# Patient Record
Sex: Female | Born: 1975
Health system: Southern US, Community
[De-identification: ages and names within clinical notes are randomized; demographics above are authoritative.]

## PROBLEM LIST (undated history)

## (undated) ENCOUNTER — Inpatient Hospital Stay (HOSPITAL_COMMUNITY): Payer: Self-pay

## (undated) DIAGNOSIS — J45909 Unspecified asthma, uncomplicated: Secondary | ICD-10-CM

## (undated) DIAGNOSIS — G4733 Obstructive sleep apnea (adult) (pediatric): Secondary | ICD-10-CM

## (undated) DIAGNOSIS — E538 Deficiency of other specified B group vitamins: Secondary | ICD-10-CM

## (undated) DIAGNOSIS — M1990 Unspecified osteoarthritis, unspecified site: Secondary | ICD-10-CM

## (undated) DIAGNOSIS — F32A Depression, unspecified: Secondary | ICD-10-CM

## (undated) DIAGNOSIS — E559 Vitamin D deficiency, unspecified: Secondary | ICD-10-CM

## (undated) DIAGNOSIS — T7840XA Allergy, unspecified, initial encounter: Secondary | ICD-10-CM

## (undated) DIAGNOSIS — N189 Chronic kidney disease, unspecified: Secondary | ICD-10-CM

## (undated) DIAGNOSIS — D649 Anemia, unspecified: Secondary | ICD-10-CM

## (undated) DIAGNOSIS — R12 Heartburn: Secondary | ICD-10-CM

## (undated) DIAGNOSIS — R7303 Prediabetes: Secondary | ICD-10-CM

## (undated) DIAGNOSIS — F419 Anxiety disorder, unspecified: Secondary | ICD-10-CM

## (undated) DIAGNOSIS — M7989 Other specified soft tissue disorders: Secondary | ICD-10-CM

## (undated) DIAGNOSIS — R51 Headache: Secondary | ICD-10-CM

## (undated) DIAGNOSIS — K219 Gastro-esophageal reflux disease without esophagitis: Secondary | ICD-10-CM

## (undated) DIAGNOSIS — IMO0002 Reserved for concepts with insufficient information to code with codable children: Secondary | ICD-10-CM

## (undated) HISTORY — DX: Gastro-esophageal reflux disease without esophagitis: K21.9

## (undated) HISTORY — PX: BUNIONECTOMY: SHX129

## (undated) HISTORY — DX: Anxiety disorder, unspecified: F41.9

## (undated) HISTORY — DX: Reserved for concepts with insufficient information to code with codable children: IMO0002

## (undated) HISTORY — DX: Anemia, unspecified: D64.9

## (undated) HISTORY — DX: Chronic kidney disease, unspecified: N18.9

## (undated) HISTORY — DX: Unspecified osteoarthritis, unspecified site: M19.90

## (undated) HISTORY — DX: Headache: R51

## (undated) HISTORY — PX: FOOT SURGERY: SHX648

## (undated) HISTORY — DX: Obstructive sleep apnea (adult) (pediatric): G47.33

## (undated) HISTORY — DX: Vitamin D deficiency, unspecified: E55.9

## (undated) HISTORY — DX: Deficiency of other specified B group vitamins: E53.8

## (undated) HISTORY — DX: Heartburn: R12

## (undated) HISTORY — DX: Other specified soft tissue disorders: M79.89

## (undated) HISTORY — DX: Prediabetes: R73.03

## (undated) HISTORY — DX: Allergy, unspecified, initial encounter: T78.40XA

## (undated) HISTORY — DX: Depression, unspecified: F32.A

## (undated) HISTORY — DX: Unspecified asthma, uncomplicated: J45.909

---

## 2010-06-30 LAB — HM PAP SMEAR: HM Pap smear: NORMAL

## 2011-02-18 ENCOUNTER — Other Ambulatory Visit (INDEPENDENT_AMBULATORY_CARE_PROVIDER_SITE_OTHER): Payer: BC Managed Care – PPO

## 2011-02-18 ENCOUNTER — Ambulatory Visit (INDEPENDENT_AMBULATORY_CARE_PROVIDER_SITE_OTHER): Payer: BC Managed Care – PPO | Admitting: Internal Medicine

## 2011-02-18 ENCOUNTER — Encounter: Payer: Self-pay | Admitting: Internal Medicine

## 2011-02-18 VITALS — BP 114/82 | HR 74 | Temp 98.7°F | Resp 20 | Ht 65.0 in | Wt 162.0 lb

## 2011-02-18 DIAGNOSIS — R5381 Other malaise: Secondary | ICD-10-CM

## 2011-02-18 DIAGNOSIS — G4730 Sleep apnea, unspecified: Secondary | ICD-10-CM

## 2011-02-18 DIAGNOSIS — D649 Anemia, unspecified: Secondary | ICD-10-CM

## 2011-02-18 DIAGNOSIS — Z23 Encounter for immunization: Secondary | ICD-10-CM

## 2011-02-18 DIAGNOSIS — R5383 Other fatigue: Secondary | ICD-10-CM | POA: Insufficient documentation

## 2011-02-18 DIAGNOSIS — R0683 Snoring: Secondary | ICD-10-CM | POA: Insufficient documentation

## 2011-02-18 LAB — FERRITIN: Ferritin: 77 ng/mL (ref 10.0–291.0)

## 2011-02-18 LAB — COMPREHENSIVE METABOLIC PANEL
AST: 20 U/L (ref 0–37)
Albumin: 3.9 g/dL (ref 3.5–5.2)
Alkaline Phosphatase: 36 U/L — ABNORMAL LOW (ref 39–117)
BUN: 13 mg/dL (ref 6–23)
Creatinine, Ser: 0.6 mg/dL (ref 0.4–1.2)
Glucose, Bld: 84 mg/dL (ref 70–99)
Total Bilirubin: 0.5 mg/dL (ref 0.3–1.2)

## 2011-02-18 LAB — URINALYSIS, ROUTINE W REFLEX MICROSCOPIC
Hgb urine dipstick: NEGATIVE
Specific Gravity, Urine: 1.015 (ref 1.000–1.030)
Urine Glucose: NEGATIVE
Urobilinogen, UA: 0.2 (ref 0.0–1.0)

## 2011-02-18 LAB — CBC WITH DIFFERENTIAL/PLATELET
Basophils Absolute: 0.1 10*3/uL (ref 0.0–0.1)
HCT: 37.3 % (ref 36.0–46.0)
Lymphs Abs: 2.2 10*3/uL (ref 0.7–4.0)
Monocytes Absolute: 0.6 10*3/uL (ref 0.1–1.0)
Monocytes Relative: 7.4 % (ref 3.0–12.0)
Platelets: 218 10*3/uL (ref 150.0–400.0)
RDW: 14.4 % (ref 11.5–14.6)

## 2011-02-18 LAB — TSH: TSH: 0.73 u[IU]/mL (ref 0.35–5.50)

## 2011-02-18 LAB — VITAMIN B12: Vitamin B-12: 411 pg/mL (ref 211–911)

## 2011-02-18 LAB — HEMOGLOBIN A1C: Hgb A1c MFr Bld: 5.5 % (ref 4.6–6.5)

## 2011-02-18 NOTE — Patient Instructions (Signed)
Anemia, Nonspecific Your exam and blood tests show you are anemic. This means your blood (hemoglobin) level is low. Normal hemoglobin values are 12 to 15 g/dL for females and 14 to 17 g/dL for males. Make a note of your hemoglobin level today. The hematocrit percent is also used to measure anemia. A normal hematocrit is 38% to 46% in females and 42% to 49% in males. Make a note of your hematocrit level today. CAUSES  Anemia can be due to many different causes.  Excessive bleeding from periods (in women).   Intestinal bleeding.   Poor nutrition.   Kidney, thyroid, liver, and bone marrow diseases.  SYMPTOMS  Anemia can come on suddenly (acute). It can also come on slowly. Symptoms can include:  Minor weakness.   Dizziness.   Palpitations.   Shortness of breath.  Symptoms may be absent until half your hemoglobin is missing if it comes on slowly. Anemia due to acute blood loss from an injury or internal bleeding may require blood transfusion if the loss is severe. Hospital care is needed if you are anemic and there is significant continual blood loss. TREATMENT   Stool tests for blood (Hemoccult) and additional lab tests are often needed. This determines the best treatment.   Further checking on your condition and your response to treatment is very important. It often takes many weeks to correct anemia.  Depending on the cause, treatment can include:  Supplements of iron.   Vitamins B12 and folic acid.   Hormone medicines.If your anemia is due to bleeding, finding the cause of the blood loss is very important. This will help avoid further problems.  SEEK IMMEDIATE MEDICAL CARE IF:   You develop fainting, extreme weakness, shortness of breath, or chest pain.   You develop heavy vaginal bleeding.   You develop bloody or black, tarry stools or vomit up blood.   You develop a high fever, rash, repeated vomiting, or dehydration.  Document Released: 03/18/2004 Document Revised:  10/21/2010 Document Reviewed: 12/24/2008 Exit[employer] Patient Information 2012 [employer]. 

## 2011-02-18 NOTE — Assessment & Plan Note (Signed)
I think the main causes for her fatigue are sleep disorder and anemia, I will check labs to look for other causes and have asked her to get a sleep apnea

## 2011-02-18 NOTE — Assessment & Plan Note (Signed)
I have asked her to get a sleep study done as I think she has sleep apnea

## 2011-02-18 NOTE — Assessment & Plan Note (Signed)
I think she has IDA from heavy periods, today I will check her CBC and vitamin levels as well

## 2011-02-18 NOTE — Progress Notes (Signed)
  Subjective:    Patient ID: Marie Harper, female    DOB: 1975/05/15, 35 y.o.   MRN: [id number]  Anemia Presents for follow-up visit. Symptoms include malaise/fatigue. There has been no abdominal pain, anorexia, bruising/bleeding easily, confusion, fever, leg swelling, light-headedness, pallor, palpitations, paresthesias, pica or weight loss. Signs of blood loss that are present include menorrhagia. Signs of blood loss that are not present include hematemesis, hematochezia, melena and vaginal bleeding. Compliance problems include psychosocial issues.  Compliance with medications is 26-50%. Side effects of medications include fatigue.      Review of Systems  Constitutional: Positive for malaise/fatigue and fatigue. Negative for fever, chills, weight loss, diaphoresis, activity change, appetite change and unexpected weight change.  HENT: Negative.   Eyes: Negative.   Respiratory: Positive for apnea (and heavy snoring). Negative for cough, choking, chest tightness, shortness of breath, wheezing and stridor.   Cardiovascular: Negative for chest pain, palpitations and leg swelling.  Gastrointestinal: Negative for nausea, vomiting, abdominal pain, diarrhea, constipation, blood in stool, melena, hematochezia, abdominal distention, anal bleeding, rectal pain, anorexia and hematemesis.  Genitourinary: Positive for menorrhagia. Negative for dysuria, urgency, frequency, hematuria, flank pain, decreased urine volume, vaginal bleeding, vaginal discharge, enuresis, difficulty urinating, genital sores, vaginal pain, menstrual problem, pelvic pain and dyspareunia.  Musculoskeletal: Negative for myalgias, back pain, joint swelling, arthralgias and gait problem.  Skin: Negative for color change, pallor, rash and wound.  Neurological: Negative for dizziness, tremors, seizures, syncope, facial asymmetry, speech difficulty, weakness, light-headedness, numbness, headaches and paresthesias.  Hematological:  Negative for adenopathy. Does not bruise/bleed easily.  Psychiatric/Behavioral: Positive for sleep disturbance (DFA,FA). Negative for suicidal ideas, hallucinations, behavioral problems, confusion, self-injury, dysphoric mood, decreased concentration and agitation. The patient is not nervous/anxious and is not hyperactive.        Objective:   Physical Exam  Vitals reviewed. Constitutional: She is oriented to person, place, and time. She appears well-developed and well-nourished. No distress.  HENT:  Head: Normocephalic and atraumatic.  Mouth/Throat: Oropharynx is clear and moist. No oropharyngeal exudate.  Eyes: Conjunctivae are normal. Right eye exhibits no discharge. Left eye exhibits no discharge. No scleral icterus.  Neck: Normal range of motion. Neck supple. No JVD present. No tracheal deviation present. No thyromegaly present.  Cardiovascular: Normal rate, regular rhythm, normal heart sounds and intact distal pulses.  Exam reveals no gallop and no friction rub.   No murmur heard. Pulmonary/Chest: Effort normal and breath sounds normal. No stridor. No respiratory distress. She has no wheezes. She has no rales. She exhibits no tenderness.  Abdominal: Soft. Bowel sounds are normal. She exhibits no distension. There is no tenderness. There is no rebound and no guarding.  Musculoskeletal: Normal range of motion. She exhibits no edema and no tenderness.  Lymphadenopathy:    She has no cervical adenopathy.  Neurological: She is oriented to person, place, and time.  Skin: Skin is warm and dry. No rash noted. She is not diaphoretic. No erythema. No pallor.  Psychiatric: She has a normal mood and affect. Her behavior is normal. Judgment and thought content normal.          Assessment & Plan:

## 2011-02-19 ENCOUNTER — Encounter: Payer: Self-pay | Admitting: Internal Medicine

## 2011-02-19 LAB — IBC PANEL
Saturation Ratios: 20.4 % (ref 20.0–50.0)
Transferrin: 221 mg/dL (ref 212.0–360.0)

## 2011-02-23 NOTE — L&D Delivery Note (Signed)
Delivery Note At 3:59 PM a viable and healthy female was delivered via Vaginal, Spontaneous Delivery (Presentation:Left ; Occiput Anterior ).  APGAR: , ; weight 6#15.   Placenta status: Intact, Spontaneous.  Cord: 3 vessels with a very short umbilical cord noted.  Cord was too short to allow the infant to placed on the maternal abdomen prior to ligation.  Anesthesia: Epidural  Episiotomy: None Lacerations: 2nd degree;Perineal Suture Repair: 2.0 vicryl rapide Est. Blood Loss (mL): 300cc  Mom to postpartum.  Baby to nursery-stable.  Marie Harper H. 11/12/2011, 4:20 PM

## 2011-03-05 ENCOUNTER — Ambulatory Visit: Payer: BC Managed Care – PPO | Admitting: Internal Medicine

## 2011-03-09 ENCOUNTER — Institutional Professional Consult (permissible substitution): Payer: BC Managed Care – PPO | Admitting: Pulmonary Disease

## 2011-03-16 ENCOUNTER — Encounter (HOSPITAL_COMMUNITY): Payer: Self-pay

## 2011-03-16 ENCOUNTER — Inpatient Hospital Stay (HOSPITAL_COMMUNITY)
Admission: AD | Admit: 2011-03-16 | Discharge: 2011-03-16 | Disposition: A | Discharge status: Home / Self Care | Payer: BC Managed Care – PPO | Source: Ambulatory Visit | Attending: Obstetrics and Gynecology | Admitting: Obstetrics and Gynecology

## 2011-03-16 ENCOUNTER — Inpatient Hospital Stay (HOSPITAL_COMMUNITY): Payer: BC Managed Care – PPO

## 2011-03-16 DIAGNOSIS — O99891 Other specified diseases and conditions complicating pregnancy: Secondary | ICD-10-CM | POA: Insufficient documentation

## 2011-03-16 DIAGNOSIS — R109 Unspecified abdominal pain: Secondary | ICD-10-CM | POA: Insufficient documentation

## 2011-03-16 DIAGNOSIS — O26899 Other specified pregnancy related conditions, unspecified trimester: Secondary | ICD-10-CM

## 2011-03-16 LAB — CBC
HCT: 34.9 % — ABNORMAL LOW (ref 36.0–46.0)
MCH: 27.3 pg (ref 26.0–34.0)
MCV: 80.6 fL (ref 78.0–100.0)
RDW: 14.6 % (ref 11.5–15.5)

## 2011-03-16 LAB — WET PREP, GENITAL
Clue Cells Wet Prep HPF POC: NONE SEEN
Trich, Wet Prep: NONE SEEN
Yeast Wet Prep HPF POC: NONE SEEN

## 2011-03-16 LAB — URINALYSIS, ROUTINE W REFLEX MICROSCOPIC
Ketones, ur: NEGATIVE mg/dL
Leukocytes, UA: NEGATIVE
Nitrite: NEGATIVE
Protein, ur: NEGATIVE mg/dL
Urobilinogen, UA: 0.2 mg/dL (ref 0.0–1.0)

## 2011-03-16 NOTE — Progress Notes (Signed)
Pt states, " I had cramping in low abdomen for a week, and thought my cycle was coming on.My HPT was negative last week, but Positive today."

## 2011-03-16 NOTE — ED Provider Notes (Signed)
History     Chief Complaint  Patient presents with  . Abdominal Pain   HPI  Pt is [redacted]weeks pregnant and presents with cramping.  She had a positive home pregnancy test and also a negative home pregnancy test.  She denies spotting or bleeding, UTI symptoms, constipation or diarrhea.  Pt was not using anything for birth control.  She has breat tenderness but no nausea or vomiting.    Past Medical History  Diagnosis Date  . Anemia   . Headache     has not had migraine in "long time"    Past Surgical History  Procedure Date  . Bunionectomy   . Foot surgery     left    Family History  Problem Relation Age of Onset  . Arthritis Other   . Heart disease Other   . Kidney disease Other   . Diabetes Other   . Diabetes Mother   . Cancer Neg Hx   . Anesthesia problems Neg Hx     History  Substance Use Topics  . Smoking status: Never Smoker   . Smokeless tobacco: Never Used  . Alcohol Use: 3.6 oz/week    2 Glasses of wine, 2 Cans of beer, 2 Shots of liquor per week    Allergies: No Known Allergies  No prescriptions prior to admission    ROS Physical Exam   Blood pressure 130/61, pulse 77, temperature 98.7 F (37.1 C), temperature source Oral, height 5' 4.5" (1.638 m), weight 167 lb 2 oz (75.807 kg), last menstrual period 02/07/2011, SpO2 99.00%.  Physical Exam  Constitutional: She is oriented to person, place, and time.  Eyes: Pupils are equal, round, and reactive to light.  Neck: Normal range of motion. Neck supple.  Cardiovascular: Normal rate.   Respiratory: Effort normal.  GI: Soft.  Genitourinary: Vagina normal.       Small amount of creamy vaginal discharge in vault; cervix clean; uterus retroverted difficult to assess size; adnexal without palpable enlargement or tenderness  Musculoskeletal: Normal range of motion.  Neurological: She is alert and oriented to person, place, and time.  Skin: Skin is warm and dry.  Psychiatric: She has a normal mood and affect.      MAU Course  Procedures Care handed over to Georges Mouse, CNM    Assessment and Plan    LINEBERRY,SUSAN 03/16/2011, 7:52 PM   Results for orders placed during the hospital encounter of 03/16/11 (from the past 24 hour(s))  URINALYSIS, ROUTINE W REFLEX MICROSCOPIC     Status: Normal   Collection Time   03/16/11  7:07 PM      Component Value Range   Color, Urine YELLOW  YELLOW    APPearance CLEAR  CLEAR    Specific Gravity, Urine 1.020  1.005 - 1.030    pH 6.0  5.0 - 8.0    Glucose, UA NEGATIVE  NEGATIVE (mg/dL)   Hgb urine dipstick NEGATIVE  NEGATIVE    Bilirubin Urine NEGATIVE  NEGATIVE    Ketones, ur NEGATIVE  NEGATIVE (mg/dL)   Protein, ur NEGATIVE  NEGATIVE (mg/dL)   Urobilinogen, UA 0.2  0.0 - 1.0 (mg/dL)   Nitrite NEGATIVE  NEGATIVE    Leukocytes, UA NEGATIVE  NEGATIVE   POCT PREGNANCY, URINE     Status: Normal   Collection Time   03/16/11  7:14 PM      Component Value Range   Preg Test, Ur POSITIVE    WET PREP, GENITAL     Status: Abnormal  Collection Time   03/16/11  7:50 PM      Component Value Range   Yeast, Wet Prep NONE SEEN  NONE SEEN    Trich, Wet Prep NONE SEEN  NONE SEEN    Clue Cells, Wet Prep NONE SEEN  NONE SEEN    WBC, Wet Prep HPF POC FEW (*) NONE SEEN   HCG, QUANTITATIVE, PREGNANCY     Status: Abnormal   Collection Time   03/16/11  8:15 PM      Component Value Range   hCG, Beta Chain, Quant, S 10215 (*) <5 (mIU/mL)  CBC     Status: Abnormal   Collection Time   03/16/11  8:15 PM      Component Value Range   WBC 10.3  4.0 - 10.5 (K/uL)   RBC 4.33  3.87 - 5.11 (MIL/uL)   Hemoglobin 11.8 (*) 12.0 - 15.0 (g/dL)   HCT 16.1 (*) 09.6 - 46.0 (%)   MCV 80.6  78.0 - 100.0 (fL)   MCH 27.3  26.0 - 34.0 (pg)   MCHC 33.8  30.0 - 36.0 (g/dL)   RDW 04.5  40.9 - 81.1 (%)   Platelets 217  150 - 400 (K/uL)  ABO/RH     Status: Normal   Collection Time   03/16/11  8:15 PM      Component Value Range   ABO/RH(D) O POS      US Ob Comp Less 14  Wks  03/16/2011  *RADIOLOGY REPORT*  Clinical Data: Vaginal bleeding and pain  OBSTETRIC <14 WK [place] AND TRANSVAGINAL OB US  Technique:  Both transabdominal and transvaginal ultrasound examinations were performed for complete evaluation of the gestation as well as the maternal uterus, adnexal regions, and pelvic cul-de-sac.  Transvaginal technique was performed to assess early pregnancy.  Comparison:  None.  Intrauterine gestational sac:  Single gestational sac is present and fundal.  Normal shape. Yolk sac: Present Embryo: Present Cardiac Activity: Present Heart Rate: 90 bpm CRL: 2   mm  five   w  six   d            [place] EDC: 11/10/2011  Maternal uterus/adnexae: Small amount of free fluid.  Ovaries are within normal limits.  No subchorionic hemorrhage.  IMPRESSION: Single live intrauterine gestation with an estimated gestational age of [redacted] weeks and 6 days.  Fetal heart rate is 90 beats per minute.  Original Report Authenticated By: Donavan Burnet, M.D.   US Ob Transvaginal  03/16/2011  *RADIOLOGY REPORT*  Clinical Data: Vaginal bleeding and pain  OBSTETRIC <14 WK [place] AND TRANSVAGINAL OB US  Technique:  Both transabdominal and transvaginal ultrasound examinations were performed for complete evaluation of the gestation as well as the maternal uterus, adnexal regions, and pelvic cul-de-sac.  Transvaginal technique was performed to assess early pregnancy.  Comparison:  None.  Intrauterine gestational sac:  Single gestational sac is present and fundal.  Normal shape. Yolk sac: Present Embryo: Present Cardiac Activity: Present Heart Rate: 90 bpm CRL: 2   mm  five   w  six   d            [place] EDC: 11/10/2011  Maternal uterus/adnexae: Small amount of free fluid.  Ovaries are within normal limits.  No subchorionic hemorrhage.  IMPRESSION: Single live intrauterine gestation with an estimated gestational age of [redacted] weeks and 6 days.  Fetal heart rate is 90 beats per minute.  Original Report Authenticated By: Donavan Burnet, M.D.  A/P: 36 y.o. G1P0000 at [redacted]w[redacted]d Start prenatal care as soon as possible Precautions rev'd

## 2011-03-17 LAB — GC/CHLAMYDIA PROBE AMP, GENITAL: GC Probe Amp, Genital: NEGATIVE

## 2011-03-17 NOTE — ED Provider Notes (Signed)
Agree with above note.  Joanne Brander 03/17/2011 7:48 AM   

## 2011-03-23 ENCOUNTER — Encounter: Payer: Self-pay | Admitting: Pulmonary Disease

## 2011-03-23 ENCOUNTER — Ambulatory Visit (INDEPENDENT_AMBULATORY_CARE_PROVIDER_SITE_OTHER): Payer: BC Managed Care – PPO | Admitting: Pulmonary Disease

## 2011-03-23 VITALS — BP 118/74 | HR 84 | Temp 98.4°F | Ht 65.5 in | Wt 166.0 lb

## 2011-03-23 DIAGNOSIS — G4730 Sleep apnea, unspecified: Secondary | ICD-10-CM

## 2011-03-23 DIAGNOSIS — R0683 Snoring: Secondary | ICD-10-CM

## 2011-03-23 DIAGNOSIS — R0609 Other forms of dyspnea: Secondary | ICD-10-CM

## 2011-03-23 NOTE — Assessment & Plan Note (Signed)
The patient has a history of snoring with frequent awakenings and frequent days of nonrestorative sleep.  However, she has very little issue with daytime sleep pressure, and feels that her alertness is adequate during waking hours.  It is unclear whether she may have mild sleep apnea, or whether this is simply the upper airway resistance syndrome.  Her sleep disruption may also be due to her bruxism, or related to some other environmental issues that is not obvious.  At this point, I would like to do home sleep testing to put the issue of sleep disordered breathing to rest.  If this is normal, I would treat her nasal airway with topical corticosteroids to see if her snoring and sleep improved.  I have also encouraged her to speak with her dentist about a bite guard for her bruxism, especially if they see evidence for teeth damage.

## 2011-03-23 NOTE — Patient Instructions (Signed)
Will set up for home sleep testing, and will call with the results. If you do not have sleep apnea, we can try to work on nasal passages to see if this will help snoring Would consult with your dentist whether you would benefit from a bite guard for teeth grinding.

## 2011-03-23 NOTE — Progress Notes (Signed)
  Subjective:    Patient ID: Marie Harper, female    DOB: [birth year], 36 y.o.   MRN: [id number]  HPI The patient is a 36 year old female who I've been asked to see for possible obstructive sleep apnea.  She has been noted by her bed partner to have snoring, as well as gurgling noises at times.  However they have not seen any breathing abnormalities.  She also has issues with teeth grinding.  The patient notes frequent awakenings during the night, and 60% of the mornings is unrested.  She does note occasional sleep pressured during her breaks at work, but overall feels that her alertness is adequate during her waking hours.  She does not nap during the day, and has no issues with sleep pressure while watching television in the evenings.  She denies any sleepiness with driving.  She does not have any leg kicks during sleep, nor does she have any symptoms suggestive of restless leg syndrome.  The patient's weight is neutral over the last 2 years, and her Epworth score is only 7 today.  Sleep Questionnaire: What time do you typically go to bed?( Between what hours) 10:00 How long does it take you to fall asleep? about 30 mins How many times during the night do you wake up? 3 What time do you get out of bed to start your day? 0500 Do you drive or operate heavy machinery in your occupation? How much has your weight changed (up or down) over the past two years? (In pounds) 10 lb (4.536 kg) Have you ever had a sleep study before? No Do you currently use CPAP? No Do you wear oxygen at any time?     Review of Systems  Constitutional: Negative for fever and unexpected weight change.  HENT: Negative for ear pain, nosebleeds, congestion, sore throat, rhinorrhea, sneezing, trouble swallowing, dental problem, postnasal drip and sinus pressure.   Eyes: Negative for redness and itching.  Respiratory: Positive for shortness of breath. Negative for cough, chest tightness and wheezing.   Cardiovascular: Negative for  palpitations and leg swelling.  Gastrointestinal: Negative for nausea and vomiting.  Genitourinary: Negative for dysuria.  Musculoskeletal: Negative for joint swelling.  Skin: Negative for rash.  Neurological: Negative for headaches.  Hematological: Does not bruise/bleed easily.  Psychiatric/Behavioral: Negative for dysphoric mood. The patient is not nervous/anxious.        Objective:   Physical Exam Constitutional:  Well developed, no acute distress  HENT:  Nares patent without discharge, but large turbinates noted.  Oropharynx without exudate, palate and uvula are normal  Eyes:  Perrla, eomi, no scleral icterus  Neck:  No JVD, no TMG  Cardiovascular:  Normal rate, regular rhythm, no rubs or gallops.  No murmurs        Intact distal pulses  Pulmonary :  Normal breath sounds, no stridor or respiratory distress   No rales, rhonchi, or wheezing  Abdominal:  Soft, nondistended, bowel sounds present.  No tenderness noted.   Musculoskeletal:  No lower extremity edema noted.  Lymph Nodes:  No cervical lymphadenopathy noted  Skin:  No cyanosis noted  Neurologic:  Alert, appropriate, moves all 4 extremities without obvious deficit.         Assessment & Plan:

## 2011-04-01 LAB — OB RESULTS CONSOLE ANTIBODY SCREEN: Antibody Screen: NEGATIVE

## 2011-04-21 ENCOUNTER — Telehealth: Payer: Self-pay | Admitting: Pulmonary Disease

## 2011-04-21 NOTE — Telephone Encounter (Signed)
Please let pt know that her sleep study does not show sleep apnea.  Good news I would like to try her on some nasal spray to see if snoring improves.  Also needs to work on modest weight loss.   Please call in flonase 2 sprays each nostril each am.  3 refills.

## 2011-04-21 NOTE — Telephone Encounter (Signed)
LMOM for pt TCB 

## 2011-04-23 ENCOUNTER — Encounter: Payer: Self-pay | Admitting: Pulmonary Disease

## 2011-04-23 ENCOUNTER — Ambulatory Visit (INDEPENDENT_AMBULATORY_CARE_PROVIDER_SITE_OTHER): Payer: BC Managed Care – PPO | Admitting: Pulmonary Disease

## 2011-04-23 DIAGNOSIS — G4733 Obstructive sleep apnea (adult) (pediatric): Secondary | ICD-10-CM

## 2011-04-23 MED ORDER — FLUTICASONE PROPIONATE 50 MCG/ACT NA SUSP: 2.0000 | Freq: Every morning | NASAL | Status: DC

## 2011-04-23 NOTE — Telephone Encounter (Signed)
Pt returned Megan's call.  Holly D Pryor ° °

## 2011-04-23 NOTE — Telephone Encounter (Signed)
Called and spoke with pt.  Informed her of sleep study results and KC's recs  Pt verbalized understanding and denied any questions and rx sent to pharmacy.

## 2011-11-11 ENCOUNTER — Inpatient Hospital Stay (HOSPITAL_COMMUNITY)
Admission: AD | Admit: 2011-11-11 | Discharge: 2011-11-11 | Disposition: A | Discharge status: Home / Self Care | Payer: BC Managed Care – PPO | Source: Ambulatory Visit | Attending: Obstetrics and Gynecology | Admitting: Obstetrics and Gynecology

## 2011-11-11 ENCOUNTER — Encounter (HOSPITAL_COMMUNITY): Payer: Self-pay | Admitting: *Deleted

## 2011-11-11 DIAGNOSIS — O479 False labor, unspecified: Secondary | ICD-10-CM | POA: Insufficient documentation

## 2011-11-11 LAB — OB RESULTS CONSOLE HEPATITIS B SURFACE ANTIGEN: Hepatitis B Surface Ag: NEGATIVE

## 2011-11-11 LAB — OB RESULTS CONSOLE RPR: RPR: NONREACTIVE

## 2011-11-11 NOTE — MAU Note (Signed)
Pt with contractions q 3-6 minutesincreasing in intensity since Dr's visit at 1430 today where pt was 4 cm.  Pt states they became more intense around 1800.  Pt denies srom, bleeding, pih symptoms.  Pt with + FM.

## 2011-11-12 ENCOUNTER — Encounter (HOSPITAL_COMMUNITY): Payer: Self-pay | Admitting: *Deleted

## 2011-11-12 ENCOUNTER — Inpatient Hospital Stay (HOSPITAL_COMMUNITY): Payer: BC Managed Care – PPO | Admitting: Anesthesiology

## 2011-11-12 ENCOUNTER — Encounter (HOSPITAL_COMMUNITY): Payer: Self-pay | Admitting: Anesthesiology

## 2011-11-12 ENCOUNTER — Inpatient Hospital Stay (HOSPITAL_COMMUNITY)
Admission: AD | Admit: 2011-11-12 | Discharge: 2011-11-14 | DRG: 373 | Disposition: A | Discharge status: Home / Self Care | Payer: BC Managed Care – PPO | Source: Ambulatory Visit | Attending: Obstetrics and Gynecology | Admitting: Obstetrics and Gynecology

## 2011-11-12 DIAGNOSIS — Z22330 Carrier of Group B streptococcus: Secondary | ICD-10-CM

## 2011-11-12 DIAGNOSIS — O99892 Other specified diseases and conditions complicating childbirth: Secondary | ICD-10-CM | POA: Diagnosis present

## 2011-11-12 LAB — RPR: RPR Ser Ql: NONREACTIVE

## 2011-11-12 LAB — CBC
Hemoglobin: 11.9 g/dL — ABNORMAL LOW (ref 12.0–15.0)
RBC: 4.52 MIL/uL (ref 3.87–5.11)
WBC: 15.4 10*3/uL — ABNORMAL HIGH (ref 4.0–10.5)

## 2011-11-12 MED ORDER — SENNOSIDES-DOCUSATE SODIUM 8.6-50 MG PO TABS
2.0000 | ORAL_TABLET | Freq: Every day | ORAL | Status: DC
  Administered 2011-11-12 – 2011-11-13 (×2): 2 via ORAL

## 2011-11-12 MED ORDER — TERBUTALINE SULFATE 1 MG/ML IJ SOLN: 0.2500 mg | Freq: Once | INTRAMUSCULAR | Status: DC | PRN

## 2011-11-12 MED ORDER — METHYLERGONOVINE MALEATE 0.2 MG PO TABS: 0.2000 mg | ORAL_TABLET | ORAL | Status: DC | PRN

## 2011-11-12 MED ORDER — ONDANSETRON HCL 4 MG/2ML IJ SOLN: 4.0000 mg | INTRAMUSCULAR | Status: DC | PRN

## 2011-11-12 MED ORDER — EPHEDRINE 5 MG/ML INJ
10.0000 mg | INTRAVENOUS | Status: DC | PRN
  Administered 2011-11-12: 10 mg via INTRAVENOUS
  Filled 2011-11-12: qty 2
  Filled 2011-11-12: qty 4

## 2011-11-12 MED ORDER — EPHEDRINE 5 MG/ML INJ
10.0000 mg | INTRAVENOUS | Status: DC | PRN
  Filled 2011-11-12: qty 2

## 2011-11-12 MED ORDER — ZOLPIDEM TARTRATE 5 MG PO TABS: 5.0000 mg | ORAL_TABLET | Freq: Every evening | ORAL | Status: DC | PRN

## 2011-11-12 MED ORDER — IBUPROFEN 600 MG PO TABS: 600.0000 mg | ORAL_TABLET | Freq: Four times a day (QID) | ORAL | Status: DC | PRN

## 2011-11-12 MED ORDER — PHENYLEPHRINE 40 MCG/ML (10ML) SYRINGE FOR IV PUSH (FOR BLOOD PRESSURE SUPPORT)
80.0000 ug | PREFILLED_SYRINGE | INTRAVENOUS | Status: DC | PRN
  Filled 2011-11-12: qty 2
  Filled 2011-11-12: qty 5

## 2011-11-12 MED ORDER — PENICILLIN G POTASSIUM 5000000 UNITS IJ SOLR: 2.5000 10*6.[IU] | INTRAVENOUS | Status: DC

## 2011-11-12 MED ORDER — OXYTOCIN 40 UNITS IN LACTATED RINGERS INFUSION - SIMPLE MED
1.0000 m[IU]/min | INTRAVENOUS | Status: DC
  Administered 2011-11-12: 2 m[IU]/min via INTRAVENOUS

## 2011-11-12 MED ORDER — ONDANSETRON HCL 4 MG PO TABS: 4.0000 mg | ORAL_TABLET | ORAL | Status: DC | PRN

## 2011-11-12 MED ORDER — PENICILLIN G POTASSIUM 5000000 UNITS IJ SOLR: 5.0000 10*6.[IU] | Freq: Once | INTRAVENOUS | Status: DC

## 2011-11-12 MED ORDER — ACETAMINOPHEN 325 MG PO TABS
650.0000 mg | ORAL_TABLET | ORAL | Status: DC | PRN
  Administered 2011-11-12: 650 mg via ORAL
  Filled 2011-11-12: qty 2

## 2011-11-12 MED ORDER — LACTATED RINGERS IV SOLN: 500.0000 mL | INTRAVENOUS | Status: DC | PRN

## 2011-11-12 MED ORDER — WITCH HAZEL-GLYCERIN EX PADS: 1.0000 "application " | MEDICATED_PAD | CUTANEOUS | Status: DC | PRN

## 2011-11-12 MED ORDER — BUTORPHANOL TARTRATE 1 MG/ML IJ SOLN
1.0000 mg | INTRAMUSCULAR | Status: DC | PRN
Start: 2011-11-12 — End: 2011-11-12
  Administered 2011-11-12: 1 mg via INTRAVENOUS
  Filled 2011-11-12: qty 1

## 2011-11-12 MED ORDER — BENZOCAINE-MENTHOL 20-0.5 % EX AERO
1.0000 "application " | INHALATION_SPRAY | CUTANEOUS | Status: DC | PRN
  Administered 2011-11-13: 1 via TOPICAL
  Filled 2011-11-12: qty 56

## 2011-11-12 MED ORDER — PENICILLIN G POTASSIUM 5000000 UNITS IJ SOLR
2.5000 10*6.[IU] | INTRAVENOUS | Status: DC
  Administered 2011-11-12 (×3): 2.5 10*6.[IU] via INTRAVENOUS
  Filled 2011-11-12 (×6): qty 2.5

## 2011-11-12 MED ORDER — LIDOCAINE HCL (PF) 1 % IJ SOLN
30.0000 mL | INTRAMUSCULAR | Status: DC | PRN
  Filled 2011-11-12: qty 30

## 2011-11-12 MED ORDER — PENICILLIN G POTASSIUM 5000000 UNITS IJ SOLR
5.0000 10*6.[IU] | Freq: Once | INTRAVENOUS | Status: AC
  Administered 2011-11-12: 5 10*6.[IU] via INTRAVENOUS
  Filled 2011-11-12: qty 5

## 2011-11-12 MED ORDER — LANOLIN HYDROUS EX OINT: TOPICAL_OINTMENT | CUTANEOUS | Status: DC | PRN

## 2011-11-12 MED ORDER — DIBUCAINE 1 % RE OINT: 1.0000 "application " | TOPICAL_OINTMENT | RECTAL | Status: DC | PRN

## 2011-11-12 MED ORDER — DIPHENHYDRAMINE HCL 50 MG/ML IJ SOLN: 12.5000 mg | INTRAMUSCULAR | Status: DC | PRN

## 2011-11-12 MED ORDER — DIPHENHYDRAMINE HCL 25 MG PO CAPS: 25.0000 mg | ORAL_CAPSULE | Freq: Four times a day (QID) | ORAL | Status: DC | PRN

## 2011-11-12 MED ORDER — LACTATED RINGERS IV SOLN
INTRAVENOUS | Status: DC
  Administered 2011-11-12 (×2): via INTRAVENOUS
  Administered 2011-11-12: 125 mL/h via INTRAVENOUS

## 2011-11-12 MED ORDER — BUTORPHANOL TARTRATE 1 MG/ML IJ SOLN
1.0000 mg | Freq: Once | INTRAMUSCULAR | Status: AC
  Administered 2011-11-12: 1 mg via INTRAVENOUS
  Filled 2011-11-12: qty 1

## 2011-11-12 MED ORDER — ONDANSETRON HCL 4 MG/2ML IJ SOLN: 4.0000 mg | Freq: Four times a day (QID) | INTRAMUSCULAR | Status: DC | PRN

## 2011-11-12 MED ORDER — TETANUS-DIPHTH-ACELL PERTUSSIS 5-2.5-18.5 LF-MCG/0.5 IM SUSP: 0.5000 mL | Freq: Once | INTRAMUSCULAR | Status: DC

## 2011-11-12 MED ORDER — CITRIC ACID-SODIUM CITRATE 334-500 MG/5ML PO SOLN: 30.0000 mL | ORAL | Status: DC | PRN

## 2011-11-12 MED ORDER — IBUPROFEN 600 MG PO TABS
600.0000 mg | ORAL_TABLET | Freq: Four times a day (QID) | ORAL | Status: DC
  Administered 2011-11-12 – 2011-11-14 (×6): 600 mg via ORAL
  Filled 2011-11-12 (×7): qty 1

## 2011-11-12 MED ORDER — OXYCODONE-ACETAMINOPHEN 5-325 MG PO TABS: 1.0000 | ORAL_TABLET | ORAL | Status: DC | PRN

## 2011-11-12 MED ORDER — PHENYLEPHRINE 40 MCG/ML (10ML) SYRINGE FOR IV PUSH (FOR BLOOD PRESSURE SUPPORT)
80.0000 ug | PREFILLED_SYRINGE | INTRAVENOUS | Status: DC | PRN
  Filled 2011-11-12: qty 2

## 2011-11-12 MED ORDER — PRENATAL MULTIVITAMIN CH
1.0000 | ORAL_TABLET | Freq: Every day | ORAL | Status: DC
  Administered 2011-11-13 – 2011-11-14 (×2): 1 via ORAL
  Filled 2011-11-12 (×2): qty 1

## 2011-11-12 MED ORDER — FENTANYL 2.5 MCG/ML BUPIVACAINE 1/10 % EPIDURAL INFUSION (WH - ANES)
14.0000 mL/h | INTRAMUSCULAR | Status: DC
  Administered 2011-11-12: 5 mL/h via EPIDURAL
  Administered 2011-11-12: 14 mL/h via EPIDURAL
  Administered 2011-11-12: 5 mL/h via EPIDURAL
  Administered 2011-11-12 (×2): 14 mL/h via EPIDURAL
  Filled 2011-11-12 (×3): qty 60

## 2011-11-12 MED ORDER — SIMETHICONE 80 MG PO CHEW: 80.0000 mg | CHEWABLE_TABLET | ORAL | Status: DC | PRN

## 2011-11-12 MED ORDER — OXYTOCIN BOLUS FROM INFUSION
500.0000 mL | Freq: Once | INTRAVENOUS | Status: DC
  Filled 2011-11-12: qty 500

## 2011-11-12 MED ORDER — LACTATED RINGERS IV SOLN: 500.0000 mL | Freq: Once | INTRAVENOUS | Status: DC

## 2011-11-12 MED ORDER — METHYLERGONOVINE MALEATE 0.2 MG/ML IJ SOLN: 0.2000 mg | INTRAMUSCULAR | Status: DC | PRN

## 2011-11-12 MED ORDER — OXYTOCIN 40 UNITS IN LACTATED RINGERS INFUSION - SIMPLE MED
62.5000 mL/h | Freq: Once | INTRAVENOUS | Status: DC
  Filled 2011-11-12: qty 1000

## 2011-11-12 NOTE — Anesthesia Procedure Notes (Signed)
Epidural Patient location during procedure: OB Start time: 11/12/2011 5:43 AM  Staffing Anesthesiologist: Brayton Caves R Performed by: anesthesiologist   Preanesthetic Checklist Completed: patient identified, site marked, surgical consent, pre-op evaluation, timeout performed, IV checked, risks and benefits discussed and monitors and equipment checked  Epidural Patient position: sitting Prep: site prepped and draped and DuraPrep Patient monitoring: continuous pulse ox and blood pressure Approach: midline Injection technique: LOR air and LOR saline  Needle:  Needle type: Tuohy  Needle gauge: 17 G Needle length: 9 cm and 9 Needle insertion depth: 6 cm Catheter type: closed end flexible Catheter size: 19 Gauge Catheter at skin depth: 12 cm Test dose: negative  Assessment Events: blood not aspirated, injection not painful, no injection resistance, negative IV test and no paresthesia  Additional Notes Patient identified.  Risk benefits discussed including failed block, incomplete pain control, headache, nerve damage, paralysis, blood pressure changes, nausea, vomiting, reactions to medication both toxic or allergic, and postpartum back pain.  Patient expressed understanding and wished to proceed.  All questions were answered.  Sterile technique used throughout procedure and epidural site dressed with sterile barrier dressing. No paresthesia or other complications noted.The patient did not experience any signs of intravascular injection such as tinnitus or metallic taste in mouth nor signs of intrathecal spread such as rapid motor block. Please see nursing notes for vital signs.

## 2011-11-12 NOTE — Progress Notes (Signed)
Patient ID: Marie Harper, female   DOB: 06-Nov-1975, 36 y.o.   MRN: [id number]   S: comfortable O: AFVSS FHT 140 reactive with accels cvx anterior rim, zero station, ascynclitic toco rare  A/P 1) Start pitocin 2) Continue to rotate patient 3) FWB reassuring

## 2011-11-12 NOTE — H&P (Signed)
36 y.o. [redacted]w[redacted]d  G1P0000 comes in c/o labor.  Otherwise has good fetal movement and no bleeding.  Past Medical History  Diagnosis Date  . Anemia   . Headache     has not had migraine in "long time"    Past Surgical History  Procedure Date  . Bunionectomy   . Foot surgery     left    OB History    Grav Para Term Preterm Abortions TAB SAB Ect Mult Living   1 0 0 0 0 0 0 0 0 0      # Outc Date GA Lbr Len/2nd Wgt Sex Del Anes PTL Lv   1 CUR               History   Social History  . Marital Status: Single    Spouse Name: N/A    Number of Children: 0  . Years of Education: N/A   Occupational History  . Location manager.  [employer]   Social History Main Topics  . Smoking status: Never Smoker   . Smokeless tobacco: Never Used  . Alcohol Use: 3.6 oz/week    2 Glasses of wine, 2 Cans of beer, 2 Shots of liquor per week  . Drug Use: No  . Sexually Active: Yes    Birth Control/ Protection: None   Other Topics Concern  . Not on file   Social History Narrative   Lives alone.Caffienated drinks-yesSeat belt use often-yesRegular Exercise-noSmoke alarm in the home-yesFirearms/guns in the home-yesHistory of physical abuse-no   Review of patient's allergies indicates no known allergies.   Prenatal Course:  Sickle cell trait FOB reported AA.  Filed Vitals:   11/12/11 0606  BP: 104/57  Pulse: 95  Temp:   Resp: 18     Lungs/Cor:  NAD Abdomen:  soft, gravid Ex:  no cords, erythema SVE:  Now 9/0/-2 per nurse. FHTs:  150s, good STV, NST R, occ mild to moderate variables Toco:  q3-4   A/P   Term labor.  GBS pos-pcn.  Aleks Nawrot A   Prenatal Transfer Tool  Maternal Diabetes: No Genetic Screening: Normal Maternal Ultrasounds/Referrals: Normal Fetal Ultrasounds or other Referrals:  None Maternal Substance Abuse:  No Significant Maternal Medications:  None Significant Maternal Lab Results: None

## 2011-11-12 NOTE — Progress Notes (Signed)
Patient ID: Marie Harper, female   DOB: 1975-07-29, 36 y.o.   MRN: [id number]   S: Feeling pressure, more leaking fluid O: AFVSS Cvx C/C/1+ FHT 140s reactive toco Q2-4  A/P 1) Start pushing

## 2011-11-12 NOTE — Anesthesia Preprocedure Evaluation (Signed)
Anesthesia Evaluation  Patient identified by MRN, date of birth, ID band Patient awake    Reviewed: Allergy & Precautions, H&P , Patient's Chart, lab work & pertinent test results  Airway Mallampati: II TM Distance: >3 FB Neck ROM: full    Dental No notable dental hx.    Pulmonary neg pulmonary ROS,  breath sounds clear to auscultation  Pulmonary exam normal       Cardiovascular negative cardio ROS  Rhythm:regular Rate:Normal     Neuro/Psych  Headaches, negative neurological ROS  negative psych ROS   GI/Hepatic negative GI ROS, Neg liver ROS,   Endo/Other  negative endocrine ROS  Renal/GU negative Renal ROS     Musculoskeletal   Abdominal   Peds  Hematology negative hematology ROS (+)   Anesthesia Other Findings   Reproductive/Obstetrics (+) Pregnancy                           Anesthesia Physical Anesthesia Plan  ASA: II  Anesthesia Plan: Epidural   Post-op Pain Management:    Induction:   Airway Management Planned:   Additional Equipment:   Intra-op Plan:   Post-operative Plan:   Informed Consent: I have reviewed the patients History and Physical, chart, labs and discussed the procedure including the risks, benefits and alternatives for the proposed anesthesia with the patient or authorized representative who has indicated his/her understanding and acceptance.     Plan Discussed with:   Anesthesia Plan Comments:         Anesthesia Quick Evaluation  

## 2011-11-12 NOTE — MAU Note (Signed)
Stadol 1mg  IV push given at 0200.  Medication did not read from scanner.

## 2011-11-13 LAB — CBC
Hemoglobin: 11.1 g/dL — ABNORMAL LOW (ref 12.0–15.0)
MCH: 26.8 pg (ref 26.0–34.0)
RBC: 4.14 MIL/uL (ref 3.87–5.11)

## 2011-11-13 MED ORDER — HYDROCORTISONE ACE-PRAMOXINE 1-1 % RE FOAM
1.0000 | Freq: Two times a day (BID) | RECTAL | Status: DC
  Administered 2011-11-13 – 2011-11-14 (×3): 1 via RECTAL
  Filled 2011-11-13: qty 10

## 2011-11-13 NOTE — Progress Notes (Signed)
Post Partum Day 1 Subjective: no complaints, up ad lib, voiding and tolerating PO  Objective: Blood pressure 100/67, pulse 82, temperature 97.9 F (36.6 C), temperature source Oral, resp. rate 18, height 5\' 5"  (1.651 m), weight 89.359 kg (197 lb), last menstrual period 02/07/2011, SpO2 98.00%, unknown if currently breastfeeding.  Physical Exam:  General: alert, cooperative and appears stated age Lochia: appropriate Uterine Fundus: firm    Basename 11/13/11 0523 11/12/11 0130  HGB 11.1* 11.9*  HCT 32.6* 35.4*    Assessment/Plan: Plan for discharge tomorrow   LOS: 1 day   Hezzie Karim H. 11/13/2011, 11:03 AM

## 2011-11-13 NOTE — Anesthesia Postprocedure Evaluation (Signed)
  Anesthesia Post-op Note  Patient: Marie Harper  Procedure(s) Performed: * No procedures listed *  Patient Location: PACU and Mother/Baby  Anesthesia Type: Epidural  Level of Consciousness: awake, alert , oriented and patient cooperative  Airway and Oxygen Therapy: Patient Spontanous Breathing  Post-op Pain: none  Post-op Assessment: Post-op Vital signs reviewed and Patient's Cardiovascular Status Stable  Post-op Vital Signs: Reviewed and stable  Complications: No apparent anesthesia complications

## 2011-11-14 MED ORDER — HYDROCORTISONE ACE-PRAMOXINE 1-1 % RE FOAM: 1.0000 | Freq: Two times a day (BID) | RECTAL | Status: DC

## 2011-11-14 MED ORDER — HYDROCODONE-ACETAMINOPHEN 5-500 MG PO TABS: 1.0000 | ORAL_TABLET | ORAL | Status: DC | PRN

## 2011-11-14 MED ORDER — IBUPROFEN 600 MG PO TABS: 600.0000 mg | ORAL_TABLET | Freq: Four times a day (QID) | ORAL | Status: DC | PRN

## 2011-11-14 MED ORDER — DOCUSATE SODIUM 100 MG PO CAPS: 100.0000 mg | ORAL_CAPSULE | Freq: Two times a day (BID) | ORAL | Status: DC

## 2011-11-14 NOTE — Discharge Summary (Signed)
Obstetric Discharge Summary Reason for Admission: onset of labor Prenatal Procedures: ultrasound Intrapartum Procedures: spontaneous vaginal delivery Postpartum Procedures: none Complications-Operative and Postpartum: 2nd degree perineal laceration Hemoglobin  Date Value Range Status  11/13/2011 11.1* 12.0 - 15.0 g/dL Final     HCT  Date Value Range Status  11/13/2011 32.6* 36.0 - 46.0 % Final    Physical Exam:  General: alert, cooperative and appears stated age 36: appropriate Uterine Fundus: firm  Discharge Diagnoses: Term Pregnancy-delivered  Discharge Information: Date: 11/14/2011 Activity: pelvic rest Diet: routine Medications: Ibuprofen, Colace and Vicodin Condition: improved Instructions: refer to practice specific booklet Discharge to: home Follow-up Information    Follow up with Almon Hercules., MD. In 4 weeks. (For a postpartum evaluation)    Contact information:   [address] [place] [address] [phone]          Newborn Data: Live born female  Birth Weight: 6 lb 15.1 oz (3150 g) APGAR: 8, 9  Home with mother.  Bernestine Holsapple H. 11/14/2011, 8:52 AM

## 2011-11-18 ENCOUNTER — Inpatient Hospital Stay (HOSPITAL_COMMUNITY): Admission: RE | Admit: 2011-11-18 | Payer: BC Managed Care – PPO | Source: Ambulatory Visit

## 2012-08-28 ENCOUNTER — Other Ambulatory Visit: Payer: Self-pay | Admitting: Family Medicine

## 2012-08-28 ENCOUNTER — Ambulatory Visit
Admission: RE | Admit: 2012-08-28 | Discharge: 2012-08-28 | Disposition: A | Discharge status: Home / Self Care | Payer: BC Managed Care – PPO | Source: Ambulatory Visit | Attending: Family Medicine | Admitting: Family Medicine

## 2012-08-28 DIAGNOSIS — R0981 Nasal congestion: Secondary | ICD-10-CM

## 2012-10-23 ENCOUNTER — Encounter (HOSPITAL_COMMUNITY): Payer: Self-pay | Admitting: Emergency Medicine

## 2012-10-23 ENCOUNTER — Emergency Department (HOSPITAL_COMMUNITY)
Admission: EM | Admit: 2012-10-23 | Discharge: 2012-10-23 | Disposition: A | Discharge status: Home / Self Care | Payer: BC Managed Care – PPO | Source: Home / Self Care | Attending: Emergency Medicine | Admitting: Emergency Medicine

## 2012-10-23 DIAGNOSIS — J209 Acute bronchitis, unspecified: Secondary | ICD-10-CM

## 2012-10-23 DIAGNOSIS — J019 Acute sinusitis, unspecified: Secondary | ICD-10-CM

## 2012-10-23 MED ORDER — AMOXICILLIN 500 MG PO CAPS: 1000.0000 mg | ORAL_CAPSULE | Freq: Three times a day (TID) | ORAL | Status: DC

## 2012-10-23 MED ORDER — ALBUTEROL SULFATE HFA 108 (90 BASE) MCG/ACT IN AERS: 1.0000 | INHALATION_SPRAY | Freq: Four times a day (QID) | RESPIRATORY_TRACT | Status: DC | PRN

## 2012-10-23 MED ORDER — TRAMADOL HCL 50 MG PO TABS: 100.0000 mg | ORAL_TABLET | Freq: Three times a day (TID) | ORAL | Status: DC | PRN

## 2012-10-23 MED ORDER — HYDROCOD POLST-CHLORPHEN POLST 10-8 MG/5ML PO LQCR: 5.0000 mL | Freq: Two times a day (BID) | ORAL | Status: DC | PRN

## 2012-10-23 MED ORDER — PREDNISONE 20 MG PO TABS: 20.0000 mg | ORAL_TABLET | Freq: Two times a day (BID) | ORAL | Status: DC

## 2012-10-23 NOTE — ED Notes (Signed)
C/o productive cough with green sputum. Sob. Denies chest pain. Fever. Runny nose.  Denies n/v/d.  Pt has tried mucinex, thera flu with no relief in symptoms. "gradually getting worse"

## 2012-10-23 NOTE — ED Provider Notes (Signed)
Chief Complaint:   Chief Complaint  Patient presents with  . URI    productive cough. fever. sob    History of Present Illness:   Marie Harper is a 37 year old female who has had a one-week plus history of cough productive yellow-green sputum, shortness of breath, wheezing, pain in her ribs and left upper quadrant of her abdomen, temperature up to 100, sore throat, nasal congestion, rhinorrhea with yellow-green nasal drainage, headache, left ear congestion. She was exposed to her daughter who had similar symptoms.  Review of Systems:  Other than noted above, the patient denies any of the following symptoms: Systemic:  No fevers, chills, sweats, weight loss or gain, fatigue, or tiredness. Eye:  No redness or discharge. ENT:  No ear pain, drainage, headache, nasal congestion, drainage, sinus pressure, difficulty swallowing, or sore throat. Neck:  No neck pain or swollen glands. Lungs:  No cough, sputum production, hemoptysis, wheezing, chest tightness, shortness of breath or chest pain. GI:  No abdominal pain, nausea, vomiting or diarrhea.  PMFSH:  Past medical history, family history, social history, meds, and allergies were reviewed.   Physical Exam:   Vital signs:  BP 129/57  Pulse 83  Temp(Src) 98.6 F (37 C) (Oral)  Resp 16  SpO2 100%  LMP 10/11/2012  Breastfeeding? No General:  Alert and oriented.  In no distress.  Skin warm and dry. Eye:  No conjunctival injection or drainage. Lids were normal. ENT:  TMs and canals were normal, without erythema or inflammation.  Nasal mucosa was clear and uncongested, without drainage.  Mucous membranes were moist.  Pharynx was clear with no exudate or drainage.  There were no oral ulcerations or lesions. Neck:  Supple, no adenopathy, tenderness or mass. Lungs:  No respiratory distress.  Lungs were clear to auscultation, without wheezes, rales or rhonchi.  Breath sounds were clear and equal bilaterally.  Heart:  Regular rhythm, without  gallops, murmers or rubs. Skin:  Clear, warm, and dry, without rash or lesions.  Assessment:  The primary encounter diagnosis was Acute bronchitis. A diagnosis of Acute sinusitis was also pertinent to this visit.  Plan:   1.  The following meds were prescribed:   Discharge Medication List as of 10/23/2012  8:17 PM    START taking these medications   Details  albuterol (PROVENTIL HFA;VENTOLIN HFA) 108 (90 BASE) MCG/ACT inhaler Inhale 1-2 puffs into the lungs every 6 (six) hours as needed for wheezing., Starting 10/23/2012, Until Discontinued, Normal    amoxicillin (AMOXIL) 500 MG capsule Take 2 capsules (1,000 mg total) by mouth 3 (three) times daily., Starting 10/23/2012, Until Discontinued, Normal    chlorpheniramine-HYDROcodone (TUSSIONEX) 10-8 MG/5ML LQCR Take 5 mLs by mouth every 12 (twelve) hours as needed., Starting 10/23/2012, Until Discontinued, Normal    predniSONE (DELTASONE) 20 MG tablet Take 1 tablet (20 mg total) by mouth 2 (two) times daily., Starting 10/23/2012, Until Discontinued, Normal    traMADol (ULTRAM) 50 MG tablet Take 2 tablets (100 mg total) by mouth every 8 (eight) hours as needed for pain., Starting 10/23/2012, Until Discontinued, Normal       2.  The patient was instructed in symptomatic care and handouts were given. 3.  The patient was told to return if becoming worse in any way, if no better in 3 or 4 days, and given some red flag symptoms such as increasing fever difficulty breathing that would indicate earlier return. 4.  Follow up here if necessary.      Reuben Likes,  MD 10/23/12 2202

## 2013-02-22 HISTORY — PX: KNEE CARTILAGE SURGERY: SHX688

## 2013-06-12 ENCOUNTER — Other Ambulatory Visit: Payer: Self-pay | Admitting: Family Medicine

## 2013-06-12 DIAGNOSIS — R222 Localized swelling, mass and lump, trunk: Secondary | ICD-10-CM

## 2013-06-12 DIAGNOSIS — M79629 Pain in unspecified upper arm: Secondary | ICD-10-CM

## 2013-06-12 DIAGNOSIS — R2230 Localized swelling, mass and lump, unspecified upper limb: Secondary | ICD-10-CM

## 2013-06-21 ENCOUNTER — Ambulatory Visit
Admission: RE | Admit: 2013-06-21 | Discharge: 2013-06-21 | Disposition: A | Discharge status: Home / Self Care | Payer: BC Managed Care – PPO | Source: Ambulatory Visit | Attending: Family Medicine | Admitting: Family Medicine

## 2013-06-21 DIAGNOSIS — R222 Localized swelling, mass and lump, trunk: Secondary | ICD-10-CM

## 2013-06-21 DIAGNOSIS — R2230 Localized swelling, mass and lump, unspecified upper limb: Secondary | ICD-10-CM

## 2013-06-21 DIAGNOSIS — M79629 Pain in unspecified upper arm: Secondary | ICD-10-CM

## 2013-06-23 ENCOUNTER — Inpatient Hospital Stay (HOSPITAL_COMMUNITY)
Admission: EM | Admit: 2013-06-23 | Discharge: 2013-06-25 | DRG: 195 | Disposition: A | Discharge status: Home / Self Care | Payer: BC Managed Care – PPO | Attending: Internal Medicine | Admitting: Internal Medicine

## 2013-06-23 ENCOUNTER — Emergency Department (HOSPITAL_COMMUNITY): Payer: BC Managed Care – PPO

## 2013-06-23 ENCOUNTER — Inpatient Hospital Stay (HOSPITAL_COMMUNITY): Payer: BC Managed Care – PPO

## 2013-06-23 ENCOUNTER — Encounter (HOSPITAL_COMMUNITY): Payer: Self-pay | Admitting: Emergency Medicine

## 2013-06-23 DIAGNOSIS — J45909 Unspecified asthma, uncomplicated: Secondary | ICD-10-CM

## 2013-06-23 DIAGNOSIS — I2699 Other pulmonary embolism without acute cor pulmonale: Secondary | ICD-10-CM

## 2013-06-23 DIAGNOSIS — J9801 Acute bronchospasm: Secondary | ICD-10-CM | POA: Diagnosis present

## 2013-06-23 DIAGNOSIS — D649 Anemia, unspecified: Secondary | ICD-10-CM | POA: Diagnosis present

## 2013-06-23 DIAGNOSIS — R0683 Snoring: Secondary | ICD-10-CM

## 2013-06-23 DIAGNOSIS — R5383 Other fatigue: Secondary | ICD-10-CM

## 2013-06-23 DIAGNOSIS — J189 Pneumonia, unspecified organism: Principal | ICD-10-CM | POA: Diagnosis present

## 2013-06-23 DIAGNOSIS — R000 Tachycardia, unspecified: Secondary | ICD-10-CM

## 2013-06-23 LAB — CBC WITH DIFFERENTIAL/PLATELET
BASOS ABS: 0.1 10*3/uL (ref 0.0–0.1)
Basophils Relative: 1 % (ref 0–1)
EOS ABS: 1 10*3/uL — AB (ref 0.0–0.7)
EOS PCT: 7 % — AB (ref 0–5)
HCT: 42.3 % (ref 36.0–46.0)
Hemoglobin: 14.5 g/dL (ref 12.0–15.0)
LYMPHS ABS: 4.1 10*3/uL — AB (ref 0.7–4.0)
LYMPHS PCT: 27 % (ref 12–46)
MCH: 28.1 pg (ref 26.0–34.0)
MCHC: 34.3 g/dL (ref 30.0–36.0)
MCV: 82 fL (ref 78.0–100.0)
Monocytes Absolute: 1 10*3/uL (ref 0.1–1.0)
Monocytes Relative: 6 % (ref 3–12)
NEUTROS PCT: 59 % (ref 43–77)
Neutro Abs: 9.3 10*3/uL — ABNORMAL HIGH (ref 1.7–7.7)
PLATELETS: 229 10*3/uL (ref 150–400)
RBC: 5.16 MIL/uL — AB (ref 3.87–5.11)
RDW: 14.1 % (ref 11.5–15.5)
WBC: 15.5 10*3/uL — AB (ref 4.0–10.5)

## 2013-06-23 LAB — I-STAT CHEM 8, ED
BUN: 9 mg/dL (ref 6–23)
CREATININE: 0.8 mg/dL (ref 0.50–1.10)
Calcium, Ion: 1.17 mmol/L (ref 1.12–1.23)
Chloride: 105 mEq/L (ref 96–112)
Glucose, Bld: 109 mg/dL — ABNORMAL HIGH (ref 70–99)
HCT: 48 % — ABNORMAL HIGH (ref 36.0–46.0)
HEMOGLOBIN: 16.3 g/dL — AB (ref 12.0–15.0)
POTASSIUM: 3.9 meq/L (ref 3.7–5.3)
SODIUM: 142 meq/L (ref 137–147)
TCO2: 22 mmol/L (ref 0–100)

## 2013-06-23 LAB — CBC
HCT: 37.1 % (ref 36.0–46.0)
Hemoglobin: 12.6 g/dL (ref 12.0–15.0)
MCH: 27.9 pg (ref 26.0–34.0)
MCHC: 34 g/dL (ref 30.0–36.0)
MCV: 82.3 fL (ref 78.0–100.0)
Platelets: 203 10*3/uL (ref 150–400)
RBC: 4.51 MIL/uL (ref 3.87–5.11)
RDW: 13.9 % (ref 11.5–15.5)
WBC: 12.6 10*3/uL — AB (ref 4.0–10.5)

## 2013-06-23 LAB — CREATININE, SERUM
Creatinine, Ser: 0.64 mg/dL (ref 0.50–1.10)
GFR calc non Af Amer: >90 mL/min (ref >90)

## 2013-06-23 MED ORDER — SODIUM CHLORIDE 0.9 % IJ SOLN
3.0000 mL | Freq: Two times a day (BID) | INTRAMUSCULAR | Status: DC
  Administered 2013-06-24: 3 mL via INTRAVENOUS

## 2013-06-23 MED ORDER — LEVOFLOXACIN IN D5W 750 MG/150ML IV SOLN
750.0000 mg | Freq: Once | INTRAVENOUS | Status: AC
  Administered 2013-06-23: 750 mg via INTRAVENOUS
  Filled 2013-06-23: qty 150

## 2013-06-23 MED ORDER — ALBUTEROL (5 MG/ML) CONTINUOUS INHALATION SOLN
INHALATION_SOLUTION | RESPIRATORY_TRACT | Status: AC
  Filled 2013-06-23: qty 20

## 2013-06-23 MED ORDER — ENOXAPARIN SODIUM 40 MG/0.4ML ~~LOC~~ SOLN
40.0000 mg | SUBCUTANEOUS | Status: DC
  Administered 2013-06-23 – 2013-06-24 (×2): 40 mg via SUBCUTANEOUS
  Filled 2013-06-23 (×3): qty 0.4

## 2013-06-23 MED ORDER — ACETAMINOPHEN 325 MG PO TABS
650.0000 mg | ORAL_TABLET | Freq: Four times a day (QID) | ORAL | Status: DC | PRN
  Administered 2013-06-23: 650 mg via ORAL
  Filled 2013-06-23: qty 2

## 2013-06-23 MED ORDER — IOHEXOL 350 MG/ML SOLN
80.0000 mL | Freq: Once | INTRAVENOUS | Status: AC | PRN
  Administered 2013-06-23: 80 mL via INTRAVENOUS

## 2013-06-23 MED ORDER — SODIUM CHLORIDE 0.9 % IV BOLUS (SEPSIS)
1000.0000 mL | Freq: Once | INTRAVENOUS | Status: AC
  Administered 2013-06-23: 1000 mL via INTRAVENOUS

## 2013-06-23 MED ORDER — ONDANSETRON HCL 4 MG PO TABS: 4.0000 mg | ORAL_TABLET | Freq: Four times a day (QID) | ORAL | Status: DC | PRN

## 2013-06-23 MED ORDER — SODIUM CHLORIDE 0.9 % IV SOLN
INTRAVENOUS | Status: DC
  Administered 2013-06-23 – 2013-06-25 (×5): via INTRAVENOUS

## 2013-06-23 MED ORDER — ACETAMINOPHEN 650 MG RE SUPP: 650.0000 mg | Freq: Four times a day (QID) | RECTAL | Status: DC | PRN

## 2013-06-23 MED ORDER — HYDROCORTISONE ACE-PRAMOXINE 1-1 % RE FOAM
1.0000 | Freq: Two times a day (BID) | RECTAL | Status: DC
Start: 2013-06-23 — End: 2013-06-25
  Filled 2013-06-23: qty 10

## 2013-06-23 MED ORDER — IPRATROPIUM-ALBUTEROL 0.5-2.5 (3) MG/3ML IN SOLN
3.0000 mL | Freq: Four times a day (QID) | RESPIRATORY_TRACT | Status: DC
  Administered 2013-06-24 – 2013-06-25 (×6): 3 mL via RESPIRATORY_TRACT
  Filled 2013-06-23 (×6): qty 3

## 2013-06-23 MED ORDER — KETOROLAC TROMETHAMINE 15 MG/ML IJ SOLN
15.0000 mg | Freq: Once | INTRAMUSCULAR | Status: AC
  Administered 2013-06-23: 15 mg via INTRAVENOUS
  Filled 2013-06-23: qty 1

## 2013-06-23 MED ORDER — ONDANSETRON HCL 4 MG/2ML IJ SOLN: 4.0000 mg | Freq: Four times a day (QID) | INTRAMUSCULAR | Status: DC | PRN

## 2013-06-23 MED ORDER — ALBUTEROL (5 MG/ML) CONTINUOUS INHALATION SOLN
10.0000 mg/h | INHALATION_SOLUTION | Freq: Once | RESPIRATORY_TRACT | Status: AC
  Administered 2013-06-23: 10 mg/h via RESPIRATORY_TRACT

## 2013-06-23 MED ORDER — HYDROCODONE-HOMATROPINE 5-1.5 MG/5ML PO SYRP
5.0000 mL | ORAL_SOLUTION | Freq: Four times a day (QID) | ORAL | Status: DC | PRN
  Administered 2013-06-23 – 2013-06-24 (×4): 5 mL via ORAL
  Filled 2013-06-23 (×4): qty 5

## 2013-06-23 MED ORDER — ALBUTEROL SULFATE (2.5 MG/3ML) 0.083% IN NEBU
2.5000 mg | INHALATION_SOLUTION | RESPIRATORY_TRACT | Status: DC | PRN
  Administered 2013-06-23: 2.5 mg via RESPIRATORY_TRACT
  Filled 2013-06-23: qty 3

## 2013-06-23 MED ORDER — IPRATROPIUM-ALBUTEROL 0.5-2.5 (3) MG/3ML IN SOLN
3.0000 mL | RESPIRATORY_TRACT | Status: DC
  Administered 2013-06-23 (×2): 3 mL via RESPIRATORY_TRACT
  Filled 2013-06-23 (×2): qty 3

## 2013-06-23 MED ORDER — LEVOFLOXACIN IN D5W 750 MG/150ML IV SOLN
750.0000 mg | INTRAVENOUS | Status: DC
  Administered 2013-06-24: 750 mg via INTRAVENOUS
  Filled 2013-06-23 (×2): qty 150

## 2013-06-23 MED ORDER — ALBUTEROL SULFATE (2.5 MG/3ML) 0.083% IN NEBU
5.0000 mg | INHALATION_SOLUTION | Freq: Once | RESPIRATORY_TRACT | Status: AC
  Administered 2013-06-23: 5 mg via RESPIRATORY_TRACT
  Filled 2013-06-23: qty 6

## 2013-06-23 NOTE — ED Provider Notes (Signed)
CSN: [id number]     Arrival date & time 06/23/13  1007 History   First MD Initiated Contact with Patient 06/23/13 1028     Chief Complaint  Patient presents with  . Shortness of Breath  . Cough     (Consider location/radiation/quality/duration/timing/severity/associated sxs/prior Treatment) Patient is a 38 y.o. female presenting with shortness of breath and cough. The history is provided by the patient.  Shortness of Breath Associated symptoms: cough   Associated symptoms: no abdominal pain and no headaches   Cough Associated symptoms: chills and shortness of breath   Associated symptoms: no headaches    patient with shortness of breath and cough on and off for the last month. She states she's been on steroids and antibiotics. She states she's gotten worse in the last few days. She states she's had some mild sputum production. No relief with nebulizers. She's had some chills. She is moderately dyspneic and is having frequent coughing, making history difficult to obtain.  Past Medical History  Diagnosis Date  . Anemia   . Headache(784.0)     has not had migraine in "long time"   Past Surgical History  Procedure Laterality Date  . Bunionectomy    . Foot surgery      left   Family History  Problem Relation Age of Onset  . Arthritis Other   . Heart disease Maternal Grandmother   . Kidney disease Other   . Diabetes Other   . Diabetes Mother   . Cancer Maternal Aunt     nasal/sinus  . Anesthesia problems Neg Hx    History  Substance Use Topics  . Smoking status: Never Smoker   . Smokeless tobacco: Never Used  . Alcohol Use: 3.6 oz/week    2 Glasses of wine, 2 Cans of beer, 2 Shots of liquor per week   OB History   Grav Para Term Preterm Abortions TAB SAB Ect Mult Living   1 1 1  0 0 0 0 0 0 1     Review of Systems  Constitutional: Positive for chills and fatigue.  Respiratory: Positive for cough and shortness of breath.   Gastrointestinal: Negative for abdominal pain  and blood in stool.  Genitourinary: Negative for dysuria.  Musculoskeletal: Negative for back pain.  Skin: Negative for pallor.  Neurological: Negative for tremors, weakness and headaches.      Allergies  Review of patient's allergies indicates no known allergies.  Home Medications   Prior to Admission medications   Medication Sig Start Date End Date Taking? Authorizing Provider  albuterol (PROVENTIL HFA;VENTOLIN HFA) 108 (90 BASE) MCG/ACT inhaler Inhale 1-2 puffs into the lungs every 6 (six) hours as needed for wheezing. 10/23/12   Reuben Likesavid C Keller, MD  amoxicillin (AMOXIL) 500 MG capsule Take 2 capsules (1,000 mg total) by mouth 3 (three) times daily. 10/23/12   Reuben Likesavid C Keller, MD  chlorpheniramine-HYDROcodone (TUSSIONEX) 10-8 MG/5ML LQCR Take 5 mLs by mouth every 12 (twelve) hours as needed. 10/23/12   Reuben Likesavid C Keller, MD  docusate sodium (COLACE) 100 MG capsule Take 1 capsule (100 mg total) by mouth 2 (two) times daily. 11/14/11   Kendra H. Tenny Crawoss, MD  HYDROcodone-acetaminophen (VICODIN) 5-500 MG per tablet Take 1 tablet by mouth every 4 (four) hours as needed for pain. 11/14/11   Kendra H. Tenny Crawoss, MD  hydrocortisone-pramoxine [facility](PROCTOFOAM-HC) rectal foam Place 1 applicator rectally 2 (two) times daily. 11/14/11   Kendra H. Tenny Crawoss, MD  ibuprofen (ADVIL,MOTRIN) 600 MG tablet Take 1 tablet (600  mg total) by mouth every 6 (six) hours as needed for pain. 11/14/11   Kendra H. Tenny Craw, MD  predniSONE (DELTASONE) 20 MG tablet Take 1 tablet (20 mg total) by mouth 2 (two) times daily. 10/23/12   Reuben Likes, MD  Prenatal Vit-Fe Fumarate-FA (PRENATAL MULTIVITAMIN) TABS Take 1 tablet by mouth daily.    Historical Provider, MD  traMADol (ULTRAM) 50 MG tablet Take 2 tablets (100 mg total) by mouth every 8 (eight) hours as needed for pain. 10/23/12   Reuben Likes, MD   BP 133/59  Pulse 129  Temp(Src) 98.9 F (37.2 C) (Oral)  Resp 18  Ht 5\' 5"  (1.651 m)  SpO2 97%  LMP 06/11/2013  Breastfeeding? No Physical Exam   Constitutional: She appears well-developed and well-nourished.  Cardiovascular: Regular rhythm.   Tachycardia  Pulmonary/Chest:  Mild tachypnea. Frequent coughs. Diffuse harsh wheezes. Prolonged expirations.  Abdominal: Soft. There is no tenderness.  Musculoskeletal: Normal range of motion. She exhibits no edema.  Neurological: She is alert.  Skin: Skin is warm and dry.    ED Course  Procedures (including critical care time) Labs Review Labs Reviewed  CBC WITH DIFFERENTIAL - Abnormal; Notable for the following:    WBC 15.5 (*)    RBC 5.16 (*)    Neutro Abs 9.3 (*)    Lymphs Abs 4.1 (*)    Eosinophils Relative 7 (*)    Eosinophils Absolute 1.0 (*)    All other components within normal limits  I-STAT CHEM 8, ED - Abnormal; Notable for the following:    Glucose, Bld 109 (*)    Hemoglobin 16.3 (*)    HCT 48.0 (*)    All other components within normal limits    Imaging Review [place] Extrem Up Right Ltd  06/21/2013   CLINICAL DATA:  38 year old female with a palpable abnormality in the right axilla, which the patient states feels to be the size of a golf ball.  EXAM: DIGITAL DIAGNOSTIC  BILATERAL MAMMOGRAM WITH CAD  ULTRASOUND RIGHT BREAST  COMPARISON:  None.  ACR Breast Density Category b: There are scattered areas of fibroglandular density.  FINDINGS: No suspicious masses or calcifications are seen in either breast. An initially questioned asymmetry seen in the mid posterior left breast on the MLO view only resolves on the additional true lateral view consistent with superimposed breast tissue.  Mammographic images were processed with CAD.  Physical examination at site of concern in the right axilla reveals a large area of soft fullness without a discrete palpable mass.  Targeted ultrasound of the right axilla was performed. No discrete masses or abnormalities are seen, only normal appearing tissue is visualized.  IMPRESSION: 1. No mammographic or sonographic correlate for the palpable  abnormality/ fullness in the right axilla. The palpable abnormality may possibly be due to asymmetric fatty tissue or breast tissue in the right axilla.  2.  No mammographic evidence of malignancy in either breast.  RECOMMENDATION: Screening mammogram at age 29 unless there are persistent or intervening clinical concerns. (Code:SM-B-40A)  I have discussed the findings and recommendations with the patient. Results were also provided in writing at the conclusion of the visit. If applicable, a reminder letter will be sent to the patient regarding the next appointment.  BI-RADS CATEGORY  1: Negative.   Electronically Signed   By: Edwin Cap M.D.   On: 06/21/2013 17:13   Dg Chest Port 1 View  06/23/2013   CLINICAL DATA:  Shortness of breath and wheezing.  EXAM: PORTABLE CHEST - 1 VIEW  COMPARISON:  None.  FINDINGS: Mediastinum and hilar structures normal. Minimal infiltrate left lung base noted. Heart size normal. No pleural effusion or pneumothorax. No acute osseous abnormality.  IMPRESSION: Minimal infiltrate in the left lung base consistent with pneumonia.   Electronically Signed   By: Maisie Fushomas  Register   On: 06/23/2013 11:10   Mm Digital Diagnostic Bilat  06/21/2013   CLINICAL DATA:  38 year old female with a palpable abnormality in the right axilla, which the patient states feels to be the size of a golf ball.  EXAM: DIGITAL DIAGNOSTIC  BILATERAL MAMMOGRAM WITH CAD  ULTRASOUND RIGHT BREAST  COMPARISON:  None.  ACR Breast Density Category b: There are scattered areas of fibroglandular density.  FINDINGS: No suspicious masses or calcifications are seen in either breast. An initially questioned asymmetry seen in the mid posterior left breast on the MLO view only resolves on the additional true lateral view consistent with superimposed breast tissue.  Mammographic images were processed with CAD.  Physical examination at site of concern in the right axilla reveals a large area of soft fullness without a  discrete palpable mass.  Targeted ultrasound of the right axilla was performed. No discrete masses or abnormalities are seen, only normal appearing tissue is visualized.  IMPRESSION: 1. No mammographic or sonographic correlate for the palpable abnormality/ fullness in the right axilla. The palpable abnormality may possibly be due to asymmetric fatty tissue or breast tissue in the right axilla.  2.  No mammographic evidence of malignancy in either breast.  RECOMMENDATION: Screening mammogram at age 38 unless there are persistent or intervening clinical concerns. (Code:SM-B-40A)  I have discussed the findings and recommendations with the patient. Results were also provided in writing at the conclusion of the visit. If applicable, a reminder letter will be sent to the patient regarding the next appointment.  BI-RADS CATEGORY  1: Negative.   Electronically Signed   By: Edwin CapJennifer  Jarosz M.D.   On: 06/21/2013 17:13     EKG Interpretation   Date/Time:  Saturday Jun 23 2013 10:09:45 EDT Ventricular Rate:  101 PR Interval:  118 QRS Duration: 80 QT Interval:  302 QTC Calculation: 391 R Axis:   67 Text Interpretation:  Sinus rhythm Artifact Sinus tachycardia Confirmed by  Rubin PayorPICKERING  MD, Harrold DonathNATHAN [phone]([id number]) on 06/23/2013 10:32:17 AM      MDM   Final diagnoses:  CAP (community acquired pneumonia)  Asthma    Patient with shortness of breath. Has been on antibiotics and steroids. Pneumonia on x-ray here. Continued tachycardia. Will admit to internal medicine.    Juliet RudeNathan R. Rubin PayorPickering, MD 06/23/13 [phone]

## 2013-06-23 NOTE — Progress Notes (Signed)
Marie Harper [id number] Code Status: FULL Admission Data: 06/23/2013 3:08 PM Attending Provider:  York SpanielBuriev WUJ:WJXBPCP:FULP, CAMMIE, MD Consults/ Treatment Team:  [facility]  Marie Harper is a 38 y.o. female patient admitted from ED awake, alert - oriented  X 3 - no acute distress noted.  VSS - Blood pressure 121/81, pulse 96, temperature 98.5 F (36.9 C), temperature source Oral, resp. rate 16, height 5\' 5"  (1.651 m), weight 77.5 kg (170 lb 13.7 oz), last menstrual period 06/11/2013, SpO2 100.00%, not currently breastfeeding. MD aware of heart rate.   IV in place, occlusive dsg intact without redness.  Orientation to room, and floor completed with information packet given to patient/family.  Patient declined safety video at this time.  Admission INP armband ID verified with patient/family, and in place.   SR up x 2, fall assessment complete, with patient and family able to verbalize understanding of risk associated with falls, and verbalized understanding to call nsg before up out of bed.  Call light within reach, patient able to voice, and demonstrate understanding.  Skin, clean-dry- intact without evidence of bruising, or skin tears.   No evidence of skin break down noted on exam.     Will cont to eval and treat per MD orders.  Aldean Astachel C Lesperance, RN 06/23/2013 3:08 PM

## 2013-06-23 NOTE — H&P (Signed)
[facility] History and Physical  Marie Harper ZOX:[id number] DOB: [birth year] DOA: 06/23/2013  Referring physician:  PCP: Cain SaupeFULP, CAMMIE, MD  Specialists:   Chief Complaint: SOB, cough   HPI: Marie Harper is a 38 y.o. female with no significant medical history presented with non resolving cough, SOB for several month, lately associated with fever, chills, night sweats; she was empirically treated outpatient with keflex, prednisone several times; denies exertional; chest [ain, no nausea, vomiting, or diarrhea; no h/o tobacco use  -today she is fount to have pneumonia on x ray   Review of Systems: The patient denies anorexia, fever, weight loss,, vision loss, decreased hearing, hoarseness, chest pain, syncope, dyspnea on exertion, peripheral edema, balance deficits, hemoptysis, abdominal pain, melena, hematochezia, severe indigestion/heartburn, hematuria, incontinence, genital sores, muscle weakness, suspicious skin lesions, transient blindness, difficulty walking, depression, unusual weight change, abnormal bleeding, enlarged lymph nodes, angioedema, and breast masses.    Past Medical History  Diagnosis Date  . Anemia   . Headache(784.0)     has not had migraine in "long time"   Past Surgical History  Procedure Laterality Date  . Bunionectomy    . Foot surgery      left   Social History:  reports that she has never smoked. She has never used smokeless tobacco. She reports that she drinks about 3.6 ounces of alcohol per week. She reports that she does not use illicit drugs. Home;  where does patient live--home, ALF, SNF? and with whom if at home? Yes;  Can patient participate in ADLs?  No Known Allergies  Family History  Problem Relation Age of Onset  . Arthritis Other   . Heart disease Maternal Grandmother   . Kidney disease Other   . Diabetes Other   . Diabetes Mother   . Cancer Maternal Aunt     nasal/sinus  . Anesthesia problems Neg Hx     (be sure to  complete)  Prior to Admission medications   Medication Sig Start Date End Date Taking? Authorizing Provider  albuterol (PROVENTIL HFA;VENTOLIN HFA) 108 (90 BASE) MCG/ACT inhaler Inhale 1-2 puffs into the lungs every 6 (six) hours as needed for wheezing. 10/23/12   Reuben Likesavid C Keller, MD  amoxicillin (AMOXIL) 500 MG capsule Take 2 capsules (1,000 mg total) by mouth 3 (three) times daily. 10/23/12   Reuben Likesavid C Keller, MD  chlorpheniramine-HYDROcodone (TUSSIONEX) 10-8 MG/5ML LQCR Take 5 mLs by mouth every 12 (twelve) hours as needed. 10/23/12   Reuben Likesavid C Keller, MD  docusate sodium (COLACE) 100 MG capsule Take 1 capsule (100 mg total) by mouth 2 (two) times daily. 11/14/11   Kendra H. Tenny Crawoss, MD  HYDROcodone-acetaminophen (VICODIN) 5-500 MG per tablet Take 1 tablet by mouth every 4 (four) hours as needed for pain. 11/14/11   Kendra H. Tenny Crawoss, MD  hydrocortisone-pramoxine [facility](PROCTOFOAM-HC) rectal foam Place 1 applicator rectally 2 (two) times daily. 11/14/11   Kendra H. Tenny Crawoss, MD  ibuprofen (ADVIL,MOTRIN) 600 MG tablet Take 1 tablet (600 mg total) by mouth every 6 (six) hours as needed for pain. 11/14/11   Kendra H. Tenny Crawoss, MD  predniSONE (DELTASONE) 20 MG tablet Take 1 tablet (20 mg total) by mouth 2 (two) times daily. 10/23/12   Reuben Likesavid C Keller, MD  Prenatal Vit-Fe Fumarate-FA (PRENATAL MULTIVITAMIN) TABS Take 1 tablet by mouth daily.    Historical Provider, MD  traMADol (ULTRAM) 50 MG tablet Take 2 tablets (100 mg total) by mouth every 8 (eight) hours as needed for pain. 10/23/12   Reuben Likesavid C Keller,  MD   Physical Exam: Filed Vitals:   06/23/13 1200  BP: 133/59  Pulse: 129  Temp:   Resp:      General:  alert  Eyes: eom-i  ENT: no oral ulcers   Neck: supple   Cardiovascular: s1,s2 tachycardia   Respiratory: BL few wheezing in LL   Abdomen: soft,nt,nd   Skin: no rash   Musculoskeletal: no LE edema  Psychiatric: no hallucinations   Neurologic: cn 2-12 intact; motor 5/5 BL  Labs on Admission:  Basic Metabolic  Panel:  Recent Labs Lab 06/23/13 1059  NA 142  K 3.9  CL 105  GLUCOSE 109*  BUN 9  CREATININE 0.80   Liver Function Tests: No results found for this basename: AST, ALT, ALKPHOS, BILITOT, PROT, ALBUMIN,  in the last 168 hours No results found for this basename: LIPASE, AMYLASE,  in the last 168 hours No results found for this basename: AMMONIA,  in the last 168 hours CBC:  Recent Labs Lab 06/23/13 1040 06/23/13 1059  WBC 15.5*  --   NEUTROABS 9.3*  --   HGB 14.5 16.3*  HCT 42.3 48.0*  MCV 82.0  --   PLT 229  --    Cardiac Enzymes: No results found for this basename: CKTOTAL, CKMB, CKMBINDEX, TROPONINI,  in the last 168 hours  BNP (last 3 results) No results found for this basename: PROBNP,  in the last 8760 hours CBG: No results found for this basename: GLUCAP,  in the last 168 hours  Radiological Exams on Admission: [employer] Extrem [employer]  06/21/2013   CLINICAL DATA:  38 year old female with a palpable abnormality in the right axilla, which the patient states feels to be the size of a golf ball.  EXAM: DIGITAL DIAGNOSTIC  BILATERAL MAMMOGRAM WITH CAD  ULTRASOUND RIGHT BREAST  COMPARISON:  None.  ACR Breast Density Category b: There are scattered areas of fibroglandular density.  FINDINGS: No suspicious masses or calcifications are seen in either breast. An initially questioned asymmetry seen in the mid posterior left breast on the MLO view only resolves on the additional true lateral view consistent with superimposed breast tissue.  Mammographic images were processed with CAD.  Physical examination at site of concern in the right axilla reveals a large area of soft fullness without a discrete palpable mass.  Targeted ultrasound of the right axilla was performed. No discrete masses or abnormalities are seen, only normal appearing tissue is visualized.  IMPRESSION: 1. No mammographic or sonographic correlate for the palpable abnormality/ fullness in the right axilla. The palpable  abnormality may possibly be due to asymmetric fatty tissue or breast tissue in the right axilla.  2.  No mammographic evidence of malignancy in either breast.  RECOMMENDATION: Screening mammogram at age 38 unless there are persistent or intervening clinical concerns. (Code:SM-B-40A)  I have discussed the findings and recommendations with the patient. Results were also provided in writing at the conclusion of the visit. If applicable, a reminder letter will be sent to the patient regarding the next appointment.  BI-RADS CATEGORY  1: Negative.   Electronically Signed   By: Edwin CapJennifer  Jarosz M.D.   On: 06/21/2013 17:13   Dg Chest Port 1 View  06/23/2013   CLINICAL DATA:  Shortness of breath and wheezing.  EXAM: PORTABLE CHEST - 1 VIEW  COMPARISON:  None.  FINDINGS: Mediastinum and hilar structures normal. Minimal infiltrate left lung base noted. Heart size normal. No pleural effusion or pneumothorax. No acute osseous abnormality.  IMPRESSION: Minimal infiltrate in the left lung base consistent with pneumonia.   Electronically Signed   By: Maisie Fus  Register   On: 06/23/2013 11:10   Mm Digital Diagnostic Bilat  06/21/2013   CLINICAL DATA:  38 year old female with a palpable abnormality in the right axilla, which the patient states feels to be the size of a golf ball.  EXAM: DIGITAL DIAGNOSTIC  BILATERAL MAMMOGRAM WITH CAD  ULTRASOUND RIGHT BREAST  COMPARISON:  None.  ACR Breast Density Category b: There are scattered areas of fibroglandular density.  FINDINGS: No suspicious masses or calcifications are seen in either breast. An initially questioned asymmetry seen in the mid posterior left breast on the MLO view only resolves on the additional true lateral view consistent with superimposed breast tissue.  Mammographic images were processed with CAD.  Physical examination at site of concern in the right axilla reveals a large area of soft fullness without a discrete palpable mass.  Targeted ultrasound of the right  axilla was performed. No discrete masses or abnormalities are seen, only normal appearing tissue is visualized.  IMPRESSION: 1. No mammographic or sonographic correlate for the palpable abnormality/ fullness in the right axilla. The palpable abnormality may possibly be due to asymmetric fatty tissue or breast tissue in the right axilla.  2.  No mammographic evidence of malignancy in either breast.  RECOMMENDATION: Screening mammogram at age 71 unless there are persistent or intervening clinical concerns. (Code:SM-B-40A)  I have discussed the findings and recommendations with the patient. Results were also provided in writing at the conclusion of the visit. If applicable, a reminder letter will be sent to the patient regarding the next appointment.  BI-RADS CATEGORY  1: Negative.   Electronically Signed   By: Edwin Cap M.D.   On: 06/21/2013 17:13    EKG: Independently reviewed.   Assessment/Plan Principal Problem:   CAP (community acquired pneumonia) Active Problems:   Anemia   38 y.o. female with no significant medical history presented with non resolving cough, SOB for several month, lately associated with fever, chills, night sweats found to have pneumonia   1. CAP; CXR: L lower lobe infiltrate; nonresolving symptoms with outpatient atx+ prednisone  -started IV levofloxacin; cont prn inhalers, hycodan for severe cough; check legionella; virus penal; hold prednisone   -obtain CT chest;   2. Tachycardia sinus likely due to underlying lung pathology; BP stable -cont gentle IVF; monitor ron tele   None;  if consultant consulted, please document name and whether formally or informally consulted  Code Status: full (must indicate code status--if unknown or must be presumed, indicate so) Family Communication:  D/w patient, her husband  (indicate person spoken with, if applicable, with phone number if by telephone) Disposition Plan: home 24-48 hours  (indicate anticipated LOS)  Time spent:  >3 5 minutes   Esperanza Sheets [facility] Pager [phone]  If 7PM-7AM, please contact night-coverage [email or web address] Password [facility] 06/23/2013, 12:23 PM

## 2013-06-23 NOTE — Progress Notes (Signed)
12:21 PM Attempted to receive report  12:27 PM report received from ED RN

## 2013-06-23 NOTE — ED Notes (Addendum)
Pt presents to ED with c/o shortness of breath and dry cough. Pt with labored breathing and inspiratory wheezing. Pt denies history of asthma. Pt diaphoretic in triage

## 2013-06-23 NOTE — Progress Notes (Signed)
Unit CM UR Completed by [facility] ED CM  W. Lillianna Sabel RN  

## 2013-06-23 NOTE — ED Notes (Signed)
Pt ambulated well to the bathroom with no assistance; family at bedside

## 2013-06-23 NOTE — ED Notes (Signed)
Pt undressed, in gown, on monitor, continuous pulse oximetry and blood pressure cuff; RT maintaining airway with contniuous neb treatment

## 2013-06-24 DIAGNOSIS — J9801 Acute bronchospasm: Secondary | ICD-10-CM | POA: Diagnosis present

## 2013-06-24 DIAGNOSIS — D649 Anemia, unspecified: Secondary | ICD-10-CM

## 2013-06-24 LAB — CBC
HCT: 33.5 % — ABNORMAL LOW (ref 36.0–46.0)
Hemoglobin: 11.2 g/dL — ABNORMAL LOW (ref 12.0–15.0)
MCH: 27.4 pg (ref 26.0–34.0)
MCHC: 33.4 g/dL (ref 30.0–36.0)
MCV: 81.9 fL (ref 78.0–100.0)
PLATELETS: 182 10*3/uL (ref 150–400)
RBC: 4.09 MIL/uL (ref 3.87–5.11)
RDW: 14.1 % (ref 11.5–15.5)
WBC: 8.6 10*3/uL (ref 4.0–10.5)

## 2013-06-24 MED ORDER — BENZONATATE 100 MG PO CAPS
200.0000 mg | ORAL_CAPSULE | Freq: Three times a day (TID) | ORAL | Status: DC
  Administered 2013-06-24 – 2013-06-25 (×3): 200 mg via ORAL
  Filled 2013-06-24 (×6): qty 2

## 2013-06-24 MED ORDER — METHYLPREDNISOLONE SODIUM SUCC 125 MG IJ SOLR
60.0000 mg | Freq: Four times a day (QID) | INTRAMUSCULAR | Status: DC
  Administered 2013-06-24 – 2013-06-25 (×4): 60 mg via INTRAVENOUS
  Filled 2013-06-24 (×9): qty 0.96

## 2013-06-24 MED ORDER — HYDROCOD POLST-CHLORPHEN POLST 10-8 MG/5ML PO LQCR
5.0000 mL | Freq: Two times a day (BID) | ORAL | Status: DC
  Administered 2013-06-24 – 2013-06-25 (×3): 5 mL via ORAL
  Filled 2013-06-24 (×3): qty 5

## 2013-06-24 MED ORDER — PANTOPRAZOLE SODIUM 40 MG PO TBEC
40.0000 mg | DELAYED_RELEASE_TABLET | Freq: Two times a day (BID) | ORAL | Status: DC
  Administered 2013-06-24 – 2013-06-25 (×3): 40 mg via ORAL
  Filled 2013-06-24 (×4): qty 1

## 2013-06-24 NOTE — Progress Notes (Addendum)
[facility] PROGRESS NOTE  Marie Harper [id number] DOB: 1975/06/20 DOA: 06/23/2013 PCP: Cain SaupeFULP, CAMMIE, MD  Assessment/Plan:  Principal Problem:   CAP (community acquired pneumonia): CT chest only shows biapical scarring. NEEDS REPEAT CT IN 6 MONTHS.  continue levaquin. Has been sick on and off since November with cough sinusitis acute bronchitis. Has had several courses of antibiotics and is scheduled to see pulmonary medicine on the sixth of this month. Add steroids as patient is wheezing. May be reflux related. At twice a day Protonix. Respiratory virus panel pending. Patient has an 3826-month-old daughter who is sick. Will also check sputum culture. Cough is severe. Will add Tussionex and Tessalon Active Problems: Bronchospasm: Continue bronchodilators, add steroids Discontinue telemetry continue IV fluids FMLA papers signed  Code Status:  full Family Communication:   Disposition Plan:  home  Consultants:    Procedures:     Antibiotics:  Levofloxacin  HPI/Subjective: Feels a little better. Not eating much. Severe cough which is unrelenting. Worse at night and lying down  Objective: Filed Vitals:   06/24/13 0529  BP: 130/76  Pulse: 79  Temp: 98.5 F (36.9 C)  Resp: 18    Intake/Output Summary (Last 24 hours) at 06/24/13 1443 Last data filed at 06/23/13 1800  Gross per 24 hour  Intake    869 ml  Output      0 ml  Net    869 ml   Filed Weights   06/23/13 1252  Weight: 77.5 kg (170 lb 13.7 oz)    Exam:   General:  Paroxysms of frequent cough productive of green sputum. can barely talk due to the coughing  Cardiovascular: Regular rate and rhythm without murmurs, rubs  Respiratory: Bilateral inspiratory and expiratory wheeze good air movement no rhonchi or rales  Abdomen: Soft nontender nondistended  Ext: No clubbing cyanosis or edema  Basic Metabolic Panel:  Recent Labs Lab 06/23/13 1059 06/23/13 1320  NA 142  --   K 3.9  --   CL  105  --   GLUCOSE 109*  --   BUN 9  --   CREATININE 0.80 0.64   Liver Function Tests: No results found for this basename: AST, ALT, ALKPHOS, BILITOT, PROT, ALBUMIN,  in the last 168 hours No results found for this basename: LIPASE, AMYLASE,  in the last 168 hours No results found for this basename: AMMONIA,  in the last 168 hours CBC:  Recent Labs Lab 06/23/13 1040 06/23/13 1059 06/23/13 1320 06/24/13 0652  WBC 15.5*  --  12.6* 8.6  NEUTROABS 9.3*  --   --   --   HGB 14.5 16.3* 12.6 11.2*  HCT 42.3 48.0* 37.1 33.5*  MCV 82.0  --  82.3 81.9  PLT 229  --  203 182   Cardiac Enzymes: No results found for this basename: CKTOTAL, CKMB, CKMBINDEX, TROPONINI,  in the last 168 hours BNP (last 3 results) No results found for this basename: PROBNP,  in the last 8760 hours CBG: No results found for this basename: GLUCAP,  in the last 168 hours  No results found for this or any previous visit (from the past 240 hour(s)).   Studies: Ct Angio Chest Pe W/cm &/or Wo Cm  06/23/2013   CLINICAL DATA:  Cough.  EXAM: CT ANGIOGRAPHY CHEST WITH CONTRAST  TECHNIQUE: Multidetector CT imaging of the chest was performed using the standard protocol during bolus administration of intravenous contrast. Multiplanar CT image reconstructions and MIPs were obtained to evaluate the vascular anatomy.  CONTRAST:  80mL OMNIPAQUE IOHEXOL 350 MG/ML SOLN  COMPARISON:  DG CHEST 1V PORT dated 06/23/2013  FINDINGS: Thoracic aorta is unremarkable. Great vessels are patent. Pulmonary arteries are normal. Heart size normal. No pericardial effusion.  No significant mediastinal or hilar adenopathy. Thoracic esophagus is distended and fluid-filled. The stomach is distended and fluid-filled. Gastroesophageal reflux may be present.  Large airways are patent. No focal pulmonary abnormality identified. Biapical pleural parenchymal thickening is noted most consistent with scarring, however given the patient's young age and the nodularity  of the apical pleural thickening particularly on the left, follow-up chest CT in 6 months suggested to demonstrate stability .  Visualized upper abdominal viscera unremarkable.  Visualized thyroid unremarkable. Shotty axillary lymph nodes. Chest wall is intact.  Review of the MIP images confirms the above findings.  IMPRESSION: 1. No evidence of pulmonary embolus. 2. The stomach and esophagus are fluid filled and distended. Gastroesophageal reflux may be present. 3. Biapical nodular pleural parenchymal thickening, particularly on the left. These changes are most likely secondary to scarring. Given the patient's young age and the nodularity of the thickening a follow-up chest CT in 6 months is suggested for further evaluation.   Electronically Signed   By: Maisie Fushomas  Register   On: 06/23/2013 14:43   Dg Chest Port 1 View  06/23/2013   CLINICAL DATA:  Shortness of breath and wheezing.  EXAM: PORTABLE CHEST - 1 VIEW  COMPARISON:  None.  FINDINGS: Mediastinum and hilar structures normal. Minimal infiltrate left lung base noted. Heart size normal. No pleural effusion or pneumothorax. No acute osseous abnormality.  IMPRESSION: Minimal infiltrate in the left lung base consistent with pneumonia.   Electronically Signed   By: Maisie Fushomas  Register   On: 06/23/2013 11:10    Scheduled Meds: . benzonatate  200 mg Oral TID  . chlorpheniramine-HYDROcodone  5 mL Oral Q12H  . enoxaparin (LOVENOX) injection  40 mg Subcutaneous Q24H  . hydrocortisone-pramoxine  1 applicator Rectal BID  . ipratropium-albuterol  3 mL Nebulization Q6H  . levofloxacin (LEVAQUIN) IV  750 mg Intravenous Q24H  . methylPREDNISolone (SOLU-MEDROL) injection  60 mg Intravenous Q6H  . pantoprazole  40 mg Oral BID  . sodium chloride  3 mL Intravenous Q12H   Continuous Infusions: . sodium chloride 100 mL/hr at 06/24/13 1006    Time spent: 35 minutes  Christiane Haorinna L Verla Bryngelson, MD  [facility] Pager [phone]. If 7PM-7AM, please contact  night-coverage at [email or web address], password [email or web address] 06/24/2013, 2:43 PM  LOS: 1 day

## 2013-06-25 DIAGNOSIS — R0989 Other specified symptoms and signs involving the circulatory and respiratory systems: Secondary | ICD-10-CM

## 2013-06-25 DIAGNOSIS — R5383 Other fatigue: Secondary | ICD-10-CM

## 2013-06-25 DIAGNOSIS — R5381 Other malaise: Secondary | ICD-10-CM

## 2013-06-25 DIAGNOSIS — R0609 Other forms of dyspnea: Secondary | ICD-10-CM

## 2013-06-25 DIAGNOSIS — J45909 Unspecified asthma, uncomplicated: Secondary | ICD-10-CM

## 2013-06-25 LAB — RESPIRATORY VIRUS PANEL
ADENOVIRUS: NOT DETECTED
INFLUENZA A: NOT DETECTED
Influenza A H1: NOT DETECTED
Influenza A H3: NOT DETECTED
Influenza B: NOT DETECTED
Metapneumovirus: NOT DETECTED
PARAINFLUENZA 2 A: NOT DETECTED
Parainfluenza 1: NOT DETECTED
Parainfluenza 3: NOT DETECTED
RESPIRATORY SYNCYTIAL VIRUS B: NOT DETECTED
RHINOVIRUS: NOT DETECTED
Respiratory Syncytial Virus A: NOT DETECTED

## 2013-06-25 LAB — LEGIONELLA ANTIGEN, URINE: Legionella Antigen, Urine: NEGATIVE

## 2013-06-25 LAB — BASIC METABOLIC PANEL
BUN: 8 mg/dL (ref 6–23)
CALCIUM: 9.2 mg/dL (ref 8.4–10.5)
CHLORIDE: 105 meq/L (ref 96–112)
CO2: 21 meq/L (ref 19–32)
Creatinine, Ser: 0.54 mg/dL (ref 0.50–1.10)
GFR calc Af Amer: >90 mL/min (ref >90)
GFR calc non Af Amer: >90 mL/min (ref >90)
Glucose, Bld: 145 mg/dL — ABNORMAL HIGH (ref 70–99)
Potassium: 4.4 mEq/L (ref 3.7–5.3)
SODIUM: 140 meq/L (ref 137–147)

## 2013-06-25 LAB — MAGNESIUM: Magnesium: 2.1 mg/dL (ref 1.5–2.5)

## 2013-06-25 MED ORDER — LEVOFLOXACIN 750 MG PO TABS: 750.0000 mg | ORAL_TABLET | Freq: Every day | ORAL | Status: AC

## 2013-06-25 MED ORDER — METHOCARBAMOL 500 MG PO TABS
500.0000 mg | ORAL_TABLET | Freq: Once | ORAL | Status: AC
  Administered 2013-06-25: 500 mg via ORAL
  Filled 2013-06-25: qty 1

## 2013-06-25 MED ORDER — HYDROCOD POLST-CHLORPHEN POLST 10-8 MG/5ML PO LQCR: 5.0000 mL | Freq: Two times a day (BID) | ORAL | Status: DC

## 2013-06-25 MED ORDER — FLUTICASONE PROPIONATE 50 MCG/ACT NA SUSP: 2.0000 | Freq: Every day | NASAL | Status: DC | PRN

## 2013-06-25 MED ORDER — BENZONATATE 200 MG PO CAPS: 200.0000 mg | ORAL_CAPSULE | Freq: Three times a day (TID) | ORAL | Status: DC

## 2013-06-25 NOTE — Discharge Summary (Signed)
Addendum  Patient seen and examined, chart and data base reviewed.  I agree with the above assessment and plan.  For full details please see Mrs. Algis DownsMarianne York PA note.  Followup with primary care physician for apical scarring in the lungs, repeat CT scan in 6 months.   Clint LippsMutaz A Jibreel Fedewa, MD [facility] Pager: [phone] 06/25/2013, 3:07 PM

## 2013-06-25 NOTE — Progress Notes (Signed)
Chaplain responded to spiritual care consult request. Patient has been discharged.   Calpine CorporationChaplain Hillary Shawna OrleansIrusta [phone]

## 2013-06-25 NOTE — Discharge Summary (Signed)
Physician Discharge Summary  Marie Harper ZOX:[id number] DOB: [birth year] DOA: 06/23/2013  PCP: Cain SaupeFULP, CAMMIE, MD  Admit date: 06/23/2013 Discharge date: 06/25/2013  Time spent: 45 minutes  Recommendations for Outpatient Follow-up:  1. Follow with FULP, CAMMIE, MD in 2 weeks to ensure the pneumonia has resolved and again for repeat CT scan in 6 months 2. Please check finalized respiratory viral panel at PCP follow up. 3. Pulmonology follow up has been scheduled with Dr. Sherene SiresWert.  Discharge Diagnoses:  Principal Problem:   CAP (community acquired pneumonia) Active Problems:   Anemia   Bronchospasm   Discharge Condition: stable   Diet recommendation: low sodium heart healthy  Filed Weights   06/23/13 1252  Weight: 77.5 kg (170 lb 13.7 oz)    History of present illness:  Marie Harper is a 38 y.o. female presented with a non resolving cough and SOB for several month, recently with fever, chills, night sweats; she was empirically treated outpatient with keflex, prednisone several times.  ED revealed pneumonia on x ray, WBC of 15.5.   Hospital Course:  CAP  CXR lower lobe infiltrate,   CT showed biapical scarring.  Needs a repeat CT in 6 months  Levaquin 750 IV while inpatient, will finish 5 day course outpatient.  Steroids for wheeze, tussionex and tessalon pearls for cough.  Steroids discontinued on discharge.  Respiratory viral panel pending  Anemia  Appears chronic,  Asymptomatic.  Follow up outpatient.  Procedures:  none  Antibiotic  levaquin    Discharge Exam: Filed Vitals:   06/25/13 0907  BP:   Pulse: 82  Temp:   Resp: 16    Physical Exam  Constitutional: She is oriented to person, place, and time. She appears well-developed and well-nourished. No distress.  HENT:   Head: Normocephalic and atraumatic.   Eyes: Pupils are equal, round, and reactive to light.  Neck: No JVD present.  Cardiovascular: Normal rate, regular rhythm and normal  heart sounds.   Respiratory: Effort normal and breath sounds normal. No stridor. No respiratory distress. She has no wheezes. She exhibits no tenderness.  GI: Soft. Bowel sounds are normal. She exhibits no distension. There is no tenderness. There is no rebound.  Musculoskeletal: Normal range of motion. She exhibits no tenderness.  Neurological: She is alert and oriented to person, place, and time.  Skin: Skin is warm and dry. She is not diaphoretic. No erythema.  Psychiatric: She has a normal mood and affect. Her speech is normal and behavior is normal. Judgment and thought content normal. Cognition and memory are normal.         Discharge Orders   Future Appointments Provider Department Dept Phone   06/27/2013 4:15 PM Nyoka CowdenMichael B Wert, MD [facility] [phone]   Future Orders Complete By Expires   Diet - low sodium heart healthy  As directed    Diet - low sodium heart healthy  As directed    Increase activity slowly  As directed    Increase activity slowly  As directed        Medication List    STOP taking these medications       hydrocortisone-pramoxine rectal foam  Commonly known as:  PROCTOFOAM-HC      TAKE these medications       albuterol 108 (90 BASE) MCG/ACT inhaler  Commonly known as:  PROVENTIL HFA;VENTOLIN HFA  Inhale 1-2 puffs into the lungs every 6 (six) hours as needed for wheezing.     ALIVE WOMENS ENERGY PO  Take 1 tablet by mouth daily.     benzonatate 200 MG capsule  Commonly known as:  TESSALON  Take 1 capsule (200 mg total) by mouth 3 (three) times daily.     chlorpheniramine-HYDROcodone 10-8 MG/5ML Lqcr  Commonly known as:  TUSSIONEX  Take 5 mLs by mouth every 12 (twelve) hours.     docusate sodium 100 MG capsule  Commonly known as:  COLACE  Take 100 mg by mouth 2 (two) times daily as needed for mild constipation.     fluticasone 50 MCG/ACT nasal spray  Commonly known as:  FLONASE  Place 2 sprays into both nostrils daily as needed  for allergies.     ibuprofen 600 MG tablet  Commonly known as:  ADVIL,MOTRIN  Take 600 mg by mouth every 6 (six) hours as needed for moderate pain.     levofloxacin 750 MG tablet  Commonly known as:  LEVAQUIN  Take 1 tablet (750 mg total) by mouth daily.     norethindrone-ethinyl estradiol 1-20 MG-MCG tablet  Commonly known as:  JUNEL FE,GILDESS FE,LOESTRIN FE  Take 1 tablet by mouth daily.     POTASSIUM GLUCONATE PO  Take 1 tablet by mouth daily.     vitamin C 500 MG tablet  Commonly known as:  ASCORBIC ACID  Take 500 mg by mouth daily.       No Known Allergies Follow-up Information   Follow up with FULP, CAMMIE, MD In 6 months. (CT scan follow up)    Specialty:  Family Medicine   Contact information:   [address] [place] [address] [phone]       Follow up with FULP, CAMMIE, MD In 2 weeks.   Specialty:  Family Medicine   Contact information:   [address] [place] [address] [phone]        The results of significant diagnostics from this hospitalization (including imaging, microbiology, ancillary and laboratory) are listed below for reference.    Significant Diagnostic Studies: Ct Angio Chest Pe W/cm &/or Wo Cm  06/23/2013   CLINICAL DATA:  Cough.  EXAM: CT ANGIOGRAPHY CHEST WITH CONTRAST  TECHNIQUE: Multidetector CT imaging of the chest was performed using the standard protocol during bolus administration of intravenous contrast. Multiplanar CT image reconstructions and MIPs were obtained to evaluate the vascular anatomy.  CONTRAST:  80mL OMNIPAQUE IOHEXOL 350 MG/ML SOLN  COMPARISON:  DG CHEST 1V PORT dated 06/23/2013  FINDINGS: Thoracic aorta is unremarkable. Great vessels are patent. Pulmonary arteries are normal. Heart size normal. No pericardial effusion.  No significant mediastinal or hilar adenopathy. Thoracic esophagus is distended and fluid-filled. The stomach is distended and fluid-filled. Gastroesophageal reflux  may be present.  Large airways are patent. No focal pulmonary abnormality identified. Biapical pleural parenchymal thickening is noted most consistent with scarring, however given the patient's young age and the nodularity of the apical pleural thickening particularly on the left, follow-up chest CT in 6 months suggested to demonstrate stability .  Visualized upper abdominal viscera unremarkable.  Visualized thyroid unremarkable. Shotty axillary lymph nodes. Chest wall is intact.  Review of the MIP images confirms the above findings.  IMPRESSION: 1. No evidence of pulmonary embolus. 2. The stomach and esophagus are fluid filled and distended. Gastroesophageal reflux may be present. 3. Biapical nodular pleural parenchymal thickening, particularly on the left. These changes are most likely secondary to scarring. Given the patient's young age and the nodularity of the thickening a follow-up chest CT  in 6 months is suggested for further evaluation.   Electronically Signed   By: Maisie Fushomas  Register   On: 06/23/2013 14:43   Dg Chest Port 1 View  06/23/2013   CLINICAL DATA:  Shortness of breath and wheezing.  EXAM: PORTABLE CHEST - 1 VIEW  COMPARISON:  None.  FINDINGS: Mediastinum and hilar structures normal. Minimal infiltrate left lung base noted. Heart size normal. No pleural effusion or pneumothorax. No acute osseous abnormality.  IMPRESSION: Minimal infiltrate in the left lung base consistent with pneumonia.   Electronically Signed   By: Maisie Fushomas  Register   On: 06/23/2013 11:10      Labs: Basic Metabolic Panel:  Recent Labs Lab 06/23/13 1059 06/23/13 1320 06/25/13 0447  NA 142  --  140  K 3.9  --  4.4  CL 105  --  105  CO2  --   --  21  GLUCOSE 109*  --  145*  BUN 9  --  8  CREATININE 0.80 0.64 0.54  CALCIUM  --   --  9.2  MG  --   --  2.1  CBC:  Recent Labs Lab 06/23/13 1040 06/23/13 1059 06/23/13 1320 06/24/13 0652  WBC 15.5*  --  12.6* 8.6  NEUTROABS 9.3*  --   --   --   HGB 14.5 16.3*  12.6 11.2*  HCT 42.3 48.0* 37.1 33.5*  MCV 82.0  --  82.3 81.9  PLT 229  --  203 182      Signed:  Basilia JumboGary Alan Benda, Student-PA Stephani PoliceMarianne L York, [place] JerseyPA-C [phone] [facility] 06/25/2013, 11:36 AM

## 2013-06-25 NOTE — Care Management Note (Signed)
    Page 1 of 1   06/25/2013     11:51:56 AM CARE MANAGEMENT NOTE 06/25/2013  Patient:  Marie Harper,Marie   Account Number:  [id number]  Date Initiated:  06/25/2013  Documentation initiated by:  Letha CapeAYLOR,Jaymen Fetch  Subjective/Objective Assessment:   dx pna  admit- lives with spouse.     Action/Plan:   Anticipated DC Date:  06/25/2013   Anticipated DC Plan:  HOME/SELF CARE      DC Planning Services  CM consult      Choice offered to / List presented to:             Status of service:  Completed, signed off Medicare Important Message given?  NO (If response is "NO", the following Medicare IM given date fields will be blank) Date Medicare IM given:   Date Additional Medicare IM given:    Discharge Disposition:  HOME/SELF CARE  Per UR Regulation:  Reviewed for med. necessity/level of care/duration of stay  If discussed at Long Length of Stay Meetings, dates discussed:    Comments:  06/25/13 1150 Letha Capeeborah Clint Strupp RN, BSN [phone] patient lives with spouse, no NCM referral no needs anticipated. Patient for dc today.

## 2013-06-25 NOTE — Progress Notes (Signed)
NURSING PROGRESS NOTE  Marie Harper [id number] Discharge Data: 06/25/2013 1:21 PM Attending Provider: Clydia LlanoMutaz Elmahi, MD WUJ:WJXBPCP:FULP, CAMMIE, MD     Marie Harper to be D/C'd Home per MD order.  Discussed with the patient the After Visit Summary and all questions fully answered. All IV's discontinued with no bleeding noted. All belongings returned to patient for patient to take home.   Last Vital Signs:  Blood pressure 139/76, pulse 82, temperature 98.1 F (36.7 C), temperature source Oral, resp. rate 16, height 5\' 5"  (1.651 m), weight 77.5 kg (170 lb 13.7 oz), last menstrual period 06/11/2013, SpO2 98.00%, not currently breastfeeding.  Discharge Medication List   Medication List    STOP taking these medications       hydrocortisone-pramoxine rectal foam  Commonly known as:  PROCTOFOAM-HC      TAKE these medications       albuterol 108 (90 BASE) MCG/ACT inhaler  Commonly known as:  PROVENTIL HFA;VENTOLIN HFA  Inhale 1-2 puffs into the lungs every 6 (six) hours as needed for wheezing.     ALIVE WOMENS ENERGY PO  Take 1 tablet by mouth daily.     benzonatate 200 MG capsule  Commonly known as:  TESSALON  Take 1 capsule (200 mg total) by mouth 3 (three) times daily.     chlorpheniramine-HYDROcodone 10-8 MG/5ML Lqcr  Commonly known as:  TUSSIONEX  Take 5 mLs by mouth every 12 (twelve) hours.     docusate sodium 100 MG capsule  Commonly known as:  COLACE  Take 100 mg by mouth 2 (two) times daily as needed for mild constipation.     fluticasone 50 MCG/ACT nasal spray  Commonly known as:  FLONASE  Place 2 sprays into both nostrils daily as needed for allergies.     ibuprofen 600 MG tablet  Commonly known as:  ADVIL,MOTRIN  Take 600 mg by mouth every 6 (six) hours as needed for moderate pain.     levofloxacin 750 MG tablet  Commonly known as:  LEVAQUIN  Take 1 tablet (750 mg total) by mouth daily.     norethindrone-ethinyl estradiol 1-20 MG-MCG tablet  Commonly known  as:  JUNEL FE,GILDESS FE,LOESTRIN FE  Take 1 tablet by mouth daily.     POTASSIUM GLUCONATE PO  Take 1 tablet by mouth daily.     vitamin C 500 MG tablet  Commonly known as:  ASCORBIC ACID  Take 500 mg by mouth daily.         Marie Parsonsattha [name], RN

## 2013-06-27 ENCOUNTER — Encounter: Payer: Self-pay | Admitting: Internal Medicine

## 2013-06-27 ENCOUNTER — Ambulatory Visit (INDEPENDENT_AMBULATORY_CARE_PROVIDER_SITE_OTHER): Payer: BC Managed Care – PPO | Admitting: Internal Medicine

## 2013-06-27 VITALS — BP 124/74 | HR 90 | Temp 98.8°F | Ht 65.0 in | Wt 171.8 lb

## 2013-06-27 DIAGNOSIS — R9389 Abnormal findings on diagnostic imaging of other specified body structures: Secondary | ICD-10-CM

## 2013-06-27 DIAGNOSIS — R05 Cough: Secondary | ICD-10-CM

## 2013-06-27 DIAGNOSIS — R059 Cough, unspecified: Secondary | ICD-10-CM

## 2013-06-27 MED ORDER — PREDNISONE 10 MG PO TABS: ORAL_TABLET | ORAL | Status: DC

## 2013-06-27 MED ORDER — MOMETASONE FURO-FORMOTEROL FUM 100-5 MCG/ACT IN AERO: INHALATION_SPRAY | RESPIRATORY_TRACT | Status: DC

## 2013-06-27 MED ORDER — FAMOTIDINE 20 MG PO TABS: ORAL_TABLET | ORAL | Status: DC

## 2013-06-27 MED ORDER — PANTOPRAZOLE SODIUM 40 MG PO TBEC: 40.0000 mg | DELAYED_RELEASE_TABLET | Freq: Every day | ORAL | Status: DC

## 2013-06-27 NOTE — Progress Notes (Signed)
Subjective:    Patient ID: Marie Harper, female    DOB: September 09, 1975  MRN: [id number]  HPI   [id number] yobf never smoker no problems as child or young adult with tendency to bad head colds since early 2014 with Sinus xray neg 08/28/12 then abrupt onset  cough / wheeze /doe then Fall 2014 p developed sneezing/ head congestion  [facility] then worse again in November 2014 progressively worse  Despite rx and referred to Pulmonary clinic 06/28/13 by Cammy Fulp p admit   Admit date: 06/23/2013  Discharge date: 06/25/2013   Recommendations for Outpatient Follow-up:  1. Follow with FULP, CAMMIE, MD in 2 weeks to ensure the pneumonia has resolved and again for repeat CT scan in 6 months 2. Please check finalized respiratory viral panel at PCP follow up. 3. Pulmonology follow up has been scheduled with Dr. Sherene SiresWert. Discharge Diagnoses:  CAP (community acquired pneumonia)  Anemia  Bronchospasm  Discharge Condition: stable  Diet recommendation: low sodium heart healthy  Filed Weights    06/23/13 1252   Weight:  77.5 kg (170 lb 13.7 oz)   History of present illness:  Marie GeneShameaka Hafford is a 38 y.o. female presented with a non resolving cough and SOB for several month, recently with fever, chills, night sweats; she was empirically treated outpatient with keflex, prednisone several times. ED revealed pneumonia on x ray, WBC of 15.5.  Hospital Course:  CAP  CXR lower lobe infiltrate,  CT showed biapical scarring. Needs a repeat CT in 6 months  Levaquin 750 IV while inpatient, will finish 5 day course outpatient.  Steroids for wheeze, tussionex and tessalon pearls for cough. Steroids discontinued on discharge.  Respiratory viral panel pending  Anemia  Appears chronic, Asymptomatic. Follow up outpatient. Procedures:  none Antibiotic  levaquin   06/27/2013 1st [facility] office visit/ Chastelyn Athens  Chief Complaint  Patient presents with  . Pulmonary Consult    Self referral- pt  c/o cough and SOB since Nov 2014. Worse for the past month with recent hospitalization for PNA. Cough is prod with minimal clear sputum.   cough much worse when lie down with freq awakening > clear mucus was thick and green  , now clear saba helps wheeze, not really helping the cough, not used since May 2 - not really sob unless coughing  No obvious other patterns in day to day or daytime variabilty or assoc  cp or chest tightness, subjective wheeze overt sinus or hb symptoms. No unusual exp hx or h/o childhood pna/ asthma or knowledge of premature birth.  No perceived need for noct saba. Also denies any obvious fluctuation of symptoms with weather or environmental changes or other aggravating or alleviating factors except as outlined above   Current Medications, Allergies, Complete Past Medical History, Past Surgical History, Family History, and Social History were [facility].              Review of Systems  Constitutional: Positive for appetite change. Negative for fever, chills and unexpected weight change.  HENT: Positive for sneezing. Negative for congestion, dental problem, ear pain, nosebleeds, postnasal drip, rhinorrhea, sinus pressure, sore throat, trouble swallowing and voice change.   Eyes: Negative for visual disturbance.  Respiratory: Positive for cough and shortness of breath. Negative for choking.   Cardiovascular: Negative for chest pain and leg swelling.  Gastrointestinal: Positive for abdominal pain. Negative for vomiting and diarrhea.  Genitourinary: Negative for difficulty  urinating.  Musculoskeletal: Negative for arthralgias.  Skin: Negative for rash.  Neurological: Negative for tremors, syncope and headaches.  Hematological: Does not bruise/bleed easily.       Objective:   Physical Exam  Pleasant amb bf nad  Wt Readings from Last 3 Encounters:  06/27/13 171 lb 12.8 oz (77.928 kg)  06/23/13 170 lb 13.7 oz (77.5 kg)   11/13/11 197 lb (89.359 kg)       HEENT: nl dentition, turbinates, and orophanx. Nl external ear canals without cough reflex   NECK :  without JVD/Nodes/TM/ nl carotid upstrokes bilaterally   LUNGS: no acc muscle use, clear to A and P bilaterally without cough on insp or exp maneuvers   CV:  RRR  no s3 or murmur or increase in P2, no edema   ABD:  soft and nontender with nl excursion in the supine position. No bruits or organomegaly, bowel sounds nl  MS:  warm without deformities, calf tenderness, cyanosis or clubbing  SKIN: warm and dry without lesions    NEURO:  alert, approp, no deficits    CTa chest 06/23/13  1. No evidence of pulmonary embolus.  2. The stomach and esophagus are fluid filled and distended.  Gastroesophageal reflux may be present.  3. Biapical nodular pleural parenchymal thickening, particularly on  the left. These changes are most likely secondary to scarring.          Assessment & Plan:

## 2013-06-27 NOTE — Patient Instructions (Addendum)
Pantoprazole (protonix) 40 mg   Take 30-60 min before first meal of the day and Pepcid 20 mg one bedtime until return to office - this is the best way to tell whether stomach acid is contributing to your problem.    GERD (REFLUX)  is an extremely common cause of respiratory symptoms, many times with no significant heartburn at all.    It can be treated with medication, but also with lifestyle changes including avoidance of late meals, excessive alcohol, smoking cessation, and avoid fatty foods, chocolate, peppermint, colas, red wine, and acidic juices such as orange juice.  NO MINT OR MENTHOL PRODUCTS SO NO COUGH DROPS  USE SUGARLESS CANDY INSTEAD (jolley ranchers or Pharmacologisttover's and Life savers) NO OIL BASED VITAMINS - use powdered substitutes.   Dulera 100 Take 2 puffs first thing in am and then another 2 puffs about 12 hours later.   Prednisone 10 mg take  4 each am x 2 days,   2 each am x 2 days,  1 each am x 2 days and stop  Use the cough medication    If not improving call [facility] [phone] for sinus CT scan before next visit.  Please schedule a follow up office visit in 2 weeks, sooner if needed

## 2013-06-28 DIAGNOSIS — R9389 Abnormal findings on diagnostic imaging of other specified body structures: Secondary | ICD-10-CM | POA: Insufficient documentation

## 2013-06-28 DIAGNOSIS — J45991 Cough variant asthma: Secondary | ICD-10-CM | POA: Insufficient documentation

## 2013-06-28 NOTE — Assessment & Plan Note (Signed)
Although there are clearly abnormalities on CT scan, they should probably be considered "microscopic" since not obvious on plain cxr .     In the setting of obvious "macroscopic" health issues,  I am very reluctatnt to embark on an invasive w/u at this point but will arrange consevative  follow up and in the meantime see what we can do to address the patient's subjective concerns.   

## 2013-06-28 NOTE — Assessment & Plan Note (Addendum)
The most common causes of chronic cough in immunocompetent adults include the following: upper airway cough syndrome (UACS), previously referred to as postnasal drip syndrome (PNDS), which is caused by variety of rhinosinus conditions; (2) asthma; (3) GERD; (4) chronic bronchitis from cigarette smoking or other inhaled environmental irritants; (5) nonasthmatic eosinophilic bronchitis; and (6) bronchiectasis.   These conditions, singly or in combination, have accounted for up to 94% of the causes of chronic cough in prospective studies.   Other conditions have constituted no >6% of the causes in prospective studies These have included bronchogenic carcinoma, chronic interstitial pneumonia, sarcoidosis, left ventricular failure, ACEI-induced cough, and aspiration from a condition associated with pharyngeal dysfunction.    Chronic cough is often simultaneously caused by more than one condition. A single cause has been found from 38 to 82% of the time, multiple causes from 18 to 62%. Multiply caused cough has been the result of three diseases up to 42% of the time.   Suspect has upper airway cough syndrome with/ without cough variant asthma ? All related to chronic sinus dz  ? Acid (or non-acid) GERD > always difficult to exclude as up to 75% of pts in some series report no assoc GI/ Heartburn symptoms> rec max (24h)  acid suppression and diet restrictions/ reviewed and instructions given in writing.   Try dulera 100 2bid   See instructions for specific recommendations which were reviewed directly with the patient who was given a copy with highlighter outlining the key components.   Next step sinus CT

## 2013-07-02 ENCOUNTER — Telehealth: Payer: Self-pay | Admitting: Internal Medicine

## 2013-07-02 ENCOUNTER — Other Ambulatory Visit: Payer: Self-pay | Admitting: Internal Medicine

## 2013-07-02 ENCOUNTER — Encounter: Payer: Self-pay | Admitting: *Deleted

## 2013-07-02 DIAGNOSIS — R05 Cough: Secondary | ICD-10-CM

## 2013-07-02 DIAGNOSIS — R059 Cough, unspecified: Secondary | ICD-10-CM

## 2013-07-02 NOTE — Telephone Encounter (Signed)
Pt returned call

## 2013-07-02 NOTE — Telephone Encounter (Signed)
Called spoke with pt. She reports she was coughing a lot last night and took cough syrup-tussionex. She did not feel comfortable going to work today. She is needing a work note for being out of work today. She reports she is going to go back tomorrow. Please advise thanks

## 2013-07-02 NOTE — Telephone Encounter (Signed)
I spoke with the pt and she wants to pick-up letter. I did not see the message from Dr. Sherene SiresWert while I had the pt on the phone about the CT scan. I atc the pt right back, NA. I have attached a note to have the pt speak with a nurse when she comes to pick-up letter so we can ask about the CT scan. Letter placed at front.  Carron CurieJennifer Cyril Railey, CMA

## 2013-07-02 NOTE — Telephone Encounter (Signed)
lmomtcb for pt 

## 2013-07-02 NOTE — Telephone Encounter (Signed)
Ok for work note > last ov rec sinus CT if cough not better so rec she go ahead and schedule now  - limited Sinus Ct

## 2013-07-02 NOTE — Telephone Encounter (Signed)
lmomtcb for pt  Would she like to pick up letter, have it faxed, or mailed?

## 2013-07-05 ENCOUNTER — Inpatient Hospital Stay: Admission: RE | Admit: 2013-07-05 | Payer: BC Managed Care – PPO | Source: Ambulatory Visit

## 2013-07-11 ENCOUNTER — Encounter: Payer: Self-pay | Admitting: Internal Medicine

## 2013-07-11 ENCOUNTER — Ambulatory Visit (INDEPENDENT_AMBULATORY_CARE_PROVIDER_SITE_OTHER): Payer: BC Managed Care – PPO | Admitting: Internal Medicine

## 2013-07-11 VITALS — BP 116/76 | HR 90 | Ht 65.0 in | Wt 172.0 lb

## 2013-07-11 DIAGNOSIS — R05 Cough: Secondary | ICD-10-CM

## 2013-07-11 DIAGNOSIS — R059 Cough, unspecified: Secondary | ICD-10-CM

## 2013-07-11 MED ORDER — MOMETASONE FURO-FORMOTEROL FUM 100-5 MCG/ACT IN AERO: INHALATION_SPRAY | RESPIRATORY_TRACT | Status: DC

## 2013-07-11 NOTE — Patient Instructions (Addendum)
Please see patient coordinator before you leave today  to schedule Sinus CT   Continue dulera 100 Take 2 puffs first thing in am and then another 2 puffs about 12 hours later.    Only use your albuterol (proair)as a rescue medication to be used if you can't catch your breath by resting or doing a relaxed purse lip breathing pattern.  - The less you use it, the better it will work when you need it. - Ok to use up to 2 puffs  every 4 hours if you must but call for immediate appointment if use goes up over your usual need - Don't leave home without it !!  (think of it like the spare tire for your car)   Please schedule a follow up office visit in 6 weeks, call sooner if needed

## 2013-07-11 NOTE — Progress Notes (Signed)
Subjective:    Patient ID: Marie Harper, female    DOB: 04/28/1975  MRN: [id number]     Brief patient profile:  [age] yobf never smoker no problems as child or young adult with tendency to bad head colds since early 2014 with Sinus xray neg 08/28/12 then abrupt onset  cough / wheeze /doe then Fall 2014 p developed sneezing/ head congestion  [facility] then worse again in November 2014 progressively worse  Despite rx and referred to Pulmonary clinic 06/28/13 by Cammy Fulp p admit   Admit date: 06/23/2013  Discharge date: 06/25/2013  Discharge Diagnoses:  CAP (community acquired pneumonia)  Anemia  Bronchospasm  Discharge Condition: stable  Diet recommendation: low sodium heart healthy  Filed Weights    06/23/13 1252   Weight:  77.5 kg (170 lb 13.7 oz)   History of present illness:  Marie GeneShameaka Friedland is a 38 y.o. female presented with a non resolving cough and SOB for several month, recently with fever, chills, night sweats; she was empirically treated outpatient with keflex, prednisone several times. ED revealed pneumonia on x ray, WBC of 15.5.  Hospital Course:  CAP  CXR lower lobe infiltrate,  CT showed biapical scarring. Needs a repeat CT in 6 months  Levaquin 750 IV while inpatient, will finish 5 day course outpatient.  Steroids for wheeze, tussionex and tessalon pearls for cough. Steroids discontinued on discharge.  Respiratory viral panel pending  Anemia  Appears chronic, Asymptomatic. Follow up outpatient. Procedures:  none Antibiotic  levaquin   06/27/2013 1st [facility] office visit/ Gretel Cantu  Chief Complaint  Patient presents with  . Pulmonary Consult    Self referral- pt c/o cough and SOB since Nov 2014. Worse for the past month with recent hospitalization for PNA. Cough is prod with minimal clear sputum.   cough much worse when lie down with freq awakening > clear mucus was thick and green  , now clear saba helps wheeze, not really  helping the cough, not used since May 2 - not really sob unless coughing rec Pantoprazole (protonix) 40 mg   Take 30-60 min before first meal of the day and Pepcid 20 mg one bedtime    GERD diet   Dulera 100 Take 2 puffs first thing in am and then another 2 puffs about 12 hours later.  Prednisone 10 mg take  4 each am x 2 days,   2 each am x 2 days,  1 each am x 2 days and stop Use the cough medication   If not improving call [facility] [phone] for sinus CT scan before next visit.   07/11/2013 f/u ov/Niall Illes re: dulera 100 2bid uses saba once a week since prev ov Chief Complaint  Patient presents with  . Follow-up    prod cough with yellow-green mucous in morning,s white-clear mucous in afternoons, less frequent since last visit.     Not limited by breathing from desired activities     No obvious day to day or daytime variabilty or assoc   cp or chest tightness, subjective wheeze overt sinus or hb symptoms. No unusual exp hx or h/o childhood pna/ asthma or knowledge of premature birth.  Sleeping ok without nocturnal  or early am exacerbation  of respiratory  c/o's or need for noct saba. Also denies any obvious fluctuation of symptoms with weather or environmental changes or other aggravating or alleviating factors except as outlined above   Current Medications, Allergies, Complete  Past Medical History, Past Surgical History, Family History, and Social History were reviewed in [facility] electronic medical record.  ROS  The following are not active complaints unless bolded sore throat, dysphagia, dental problems, itching, sneezing,  nasal congestion or excess/ purulent secretions, ear ache,   fever, chills, sweats, unintended wt loss, pleuritic or exertional cp, hemoptysis,  orthopnea pnd or leg swelling, presyncope, palpitations, heartburn, abdominal pain, anorexia, nausea, vomiting, diarrhea  or change in bowel or urinary habits, change in stools or urine, dysuria,hematuria,  rash, arthralgias,  visual complaints, headache, numbness weakness or ataxia or problems with walking or coordination,  change in mood/affect or memory.                 Objective:   Physical Exam  Pleasant amb bf nad   07/11/2013        172 Wt Readings from Last 3 Encounters:  06/27/13 171 lb 12.8 oz (77.928 kg)  06/23/13 170 lb 13.7 oz (77.5 kg)  11/13/11 197 lb (89.359 kg)       HEENT: nl dentition, turbinates, and orophanx. Nl external ear canals without cough reflex   NECK :  without JVD/Nodes/TM/ nl carotid upstrokes bilaterally   LUNGS: no acc muscle use, clear to A and P bilaterally without cough on insp or exp maneuvers   CV:  RRR  no s3 or murmur or increase in P2, no edema   ABD:  soft and nontender with nl excursion in the supine position. No bruits or organomegaly, bowel sounds nl  MS:  warm without deformities, calf tenderness, cyanosis or clubbing       CTa chest 06/23/13  1. No evidence of pulmonary embolus.  2. The stomach and esophagus are fluid filled and distended.  Gastroesophageal reflux may be present.  3. Biapical nodular pleural parenchymal thickening, particularly on  the left. These changes are most likely secondary to scarring.          Assessment & Plan:

## 2013-07-11 NOTE — Assessment & Plan Note (Addendum)
started on dulera 100 2bid 06/27/13> better so should continue for now but note still am cough.  DDX of  difficult airways managment all start with A and  include Adherence, Ace Inhibitors, Acid Reflux, Active Sinus Disease, Alpha 1 Antitripsin deficiency, Anxiety masquerading as Airways dz,  ABPA,  allergy(esp in young), Aspiration (esp in elderly), Adverse effects of DPI,  Active smokers, plus two Bs  = Bronchiectasis and Beta blocker use..and one C= CHF  Adherence is always the initial "prime suspect" and is a multilayered concern that requires a "trust but verify" approach in every patient - starting with knowing how to use medications, especially inhalers, correctly, keeping up with refills and understanding the fundamental difference between maintenance and prns vs those medications only taken for a very short course and then stopped and not refilled.  The proper method of use, as well as anticipated side effects, of a metered-dose inhaler are discussed and demonstrated to the patient. Improved effectiveness after extensive coaching during this visit to a level of approximately  90%  ? Acute / active sinus dz > check sinus CT  ? Acid (or non-acid) GERD > always difficult to exclude as up to 75% of pts in some series report no assoc GI/ Heartburn symptoms> rec continue max (24h)  acid suppression and diet restrictions/ reviewed and instructions given in writing.

## 2013-07-20 ENCOUNTER — Inpatient Hospital Stay: Admission: RE | Admit: 2013-07-20 | Payer: BC Managed Care – PPO | Source: Ambulatory Visit

## 2013-07-23 ENCOUNTER — Encounter: Payer: Self-pay | Admitting: Internal Medicine

## 2013-07-24 ENCOUNTER — Ambulatory Visit (INDEPENDENT_AMBULATORY_CARE_PROVIDER_SITE_OTHER)
Admission: RE | Admit: 2013-07-24 | Discharge: 2013-07-24 | Disposition: A | Discharge status: Home / Self Care | Payer: BC Managed Care – PPO | Source: Ambulatory Visit | Attending: Internal Medicine | Admitting: Internal Medicine

## 2013-07-24 ENCOUNTER — Encounter: Payer: Self-pay | Admitting: Internal Medicine

## 2013-07-24 DIAGNOSIS — R059 Cough, unspecified: Secondary | ICD-10-CM

## 2013-07-24 DIAGNOSIS — R05 Cough: Secondary | ICD-10-CM

## 2013-07-24 NOTE — Progress Notes (Signed)
Quick Note:  LMTCB ______ 

## 2013-07-25 NOTE — Progress Notes (Signed)
Quick Note:  Spoke with pt and notified of results per Dr. Wert. Pt verbalized understanding and denied any questions.  ______ 

## 2013-07-26 ENCOUNTER — Other Ambulatory Visit: Payer: Self-pay | Admitting: Internal Medicine

## 2013-07-26 MED ORDER — PANTOPRAZOLE SODIUM 40 MG PO TBEC: 40.0000 mg | DELAYED_RELEASE_TABLET | Freq: Every day | ORAL | Status: DC

## 2013-07-26 MED ORDER — FAMOTIDINE 20 MG PO TABS: ORAL_TABLET | ORAL | Status: DC

## 2013-08-22 ENCOUNTER — Ambulatory Visit (INDEPENDENT_AMBULATORY_CARE_PROVIDER_SITE_OTHER): Payer: BC Managed Care – PPO | Admitting: Internal Medicine

## 2013-08-22 ENCOUNTER — Encounter: Payer: Self-pay | Admitting: Internal Medicine

## 2013-08-22 VITALS — BP 118/84 | HR 87 | Temp 98.6°F | Ht 65.5 in | Wt 176.0 lb

## 2013-08-22 DIAGNOSIS — R05 Cough: Secondary | ICD-10-CM

## 2013-08-22 DIAGNOSIS — R059 Cough, unspecified: Secondary | ICD-10-CM

## 2013-08-22 DIAGNOSIS — R058 Other specified cough: Secondary | ICD-10-CM | POA: Insufficient documentation

## 2013-08-22 DIAGNOSIS — J45991 Cough variant asthma: Secondary | ICD-10-CM

## 2013-08-22 MED ORDER — MOMETASONE FURO-FORMOTEROL FUM 200-5 MCG/ACT IN AERO
INHALATION_SPRAY | RESPIRATORY_TRACT | Status: DC
Start: 2013-08-22 — End: 2013-10-09

## 2013-08-22 NOTE — Patient Instructions (Addendum)
For drainage take chlortrimeton (chlorpheniramine) 4 mg every 4 hours available over the counter (may cause drowsiness)   Increase Dulera 200 Take 2 puffs first thing in am and then another 2 puffs about 12 hours later.   Please schedule a follow up office visit in 2 weeks, sooner if needed  Late add: check dulera count , given 15 d sample only  Consider short term reglan rx next ov

## 2013-08-22 NOTE — Assessment & Plan Note (Addendum)
-   max acid suppression started 06/27/13  - sinus CT neg 07/24/13  - trial of chlortrimeton next step per guidelines  One of the remaining issues not addressed is non acid gerd suggested by prev CT chest and h/o chronic bcp rx

## 2013-08-22 NOTE — Assessment & Plan Note (Addendum)
-   started on dulera 100 2bid 06/27/13> better 07/11/2013 - 07/11/2013 p extensive coaching HFA effectiveness =    90% - 07/24/13 Sinus CT > Slight mucosal thickening in the right maxillary sinus. Remainder of the paranasal sinuses are clear. No air-fluid levels. No acute bony abnormality. Orbital soft tissues are unremarkable. - spirometry  08/22/2013 min airflow obst reflected in fef 25-75 despite prominent "wheeze" on exam  Not yet convinced which if any of her symptoms reflect primary airways dz ie asthma - to test hypothesis rec push dulera to 200 with f/u in 2 weeks  Which  If this this is uacs may cause worse symptoms

## 2013-08-22 NOTE — Progress Notes (Signed)
Subjective:    Patient ID: Marie Harper, female    DOB: 06/02/1975  MRN: [id number]     Brief patient profile:  [age] yobf never smoker no problems as child or young adult with tendency to bad head colds since early 2014 with Sinus xray neg 08/28/12 then abrupt onset  cough / wheeze /doe then Fall 2014 p developed sneezing/ head congestion  [facility] [address]  rx abx then worse again in November 2014 progressively worse  Despite rx and referred to Pulmonary clinic 06/28/13 by Cammy Fulp p admit   Admit date: 06/23/2013  Discharge date: 06/25/2013  Discharge Diagnoses:  CAP (community acquired pneumonia)  Anemia  Bronchospasm  Discharge Condition: stable  Diet recommendation: low sodium heart healthy  Filed Weights    06/23/13 1252   Weight:  77.5 kg (170 lb 13.7 oz)   History of present illness:  Marie Harper is a 38 y.o. female presented with a non resolving cough and SOB for several month, recently with fever, chills, night sweats; she was empirically treated outpatient with keflex, prednisone several times. ED revealed pneumonia on x ray, WBC of 15.5.  Hospital Course:  CAP  CXR lower lobe infiltrate,  CT showed biapical scarring. Needs a repeat CT in 6 months  Levaquin 750 IV while inpatient, will finish 5 day course outpatient.  Steroids for wheeze, tussionex and tessalon pearls for cough. Steroids discontinued on discharge.  Respiratory viral panel pending  Anemia  Appears chronic, Asymptomatic. Follow up outpatient. Procedures:  none Antibiotic  levaquin   06/27/2013 1st [facility] office visit/ Aristides Luckey  Chief Complaint  Patient presents with  . Pulmonary Consult    Self referral- pt c/o cough and SOB since Nov 2014. Worse for the past month with recent hospitalization for PNA. Cough is prod with minimal clear sputum.   cough much worse when lie down with freq awakening > clear mucus was thick and green  , now clear saba helps wheeze, not  really helping the cough, not used since May 2 - not really sob unless coughing rec Pantoprazole (protonix) 40 mg   Take 30-60 min before first meal of the day and Pepcid 20 mg one bedtime    GERD diet   Dulera 100 Take 2 puffs first thing in am and then another 2 puffs about 12 hours later.  Prednisone 10 mg take  4 each am x 2 days,   2 each am x 2 days,  1 each am x 2 days and stop Use the cough medication   If not improving call [facility] [phone] for sinus CT scan before next visit.   07/11/2013 f/u ov/Rylend Pietrzak re: dulera 100 2bid uses saba once a week since prev ov Chief Complaint  Patient presents with  . Follow-up    prod cough with yellow-green mucous in morning,s white-clear mucous in afternoons, less frequent since last visit.   Not limited by breathing from desired activities   rec Please see patient coordinator before you leave today  to schedule Sinus CT > neg  Continue dulera 100 Take 2 puffs first thing in am and then another 2 puffs about 12 hours later.  Only use your albuterol (proair) as rescue   08/22/2013 f/u ov/Katharyn Schauer re: chronic cough Nov 2014 / still on bcps/has not purchased dulera yet/ changed flonase to Merrill Lynchnasacort Chief Complaint  Patient presents with  . Follow-up    Pt states her cough is improved compared since her last  visit, but still has some cough- prod with minimal yellow sputum. She states that she just finished round of ceftin for a sinus infection  mostly coughing 10 min p stir in am then all day long and better while sleeping  Only used rescue saba x one since last ov  Denies missing doses of dulera but counts are way off     No obvious day to day or daytime variabilty or assoc   cp or chest tightness, subjective wheeze overt   hb symptoms. No unusual exp hx or h/o childhood pna/ asthma or knowledge of premature birth.  Sleeping ok without nocturnal  or early am exacerbation  of respiratory  c/o's or need for noct saba. Also denies any obvious fluctuation of  symptoms with weather or environmental changes or other aggravating or alleviating factors except as outlined above   Current Medications, Allergies, Complete Past Medical History, Past Surgical History, Family History, and Social History were reviewed in [facility] electronic medical record.  ROS  The following are not active complaints unless bolded sore throat, dysphagia, dental problems, itching, sneezing,  nasal congestion or excess/ purulent secretions, ear ache,   fever, chills, sweats, unintended wt loss, pleuritic or exertional cp, hemoptysis,  orthopnea pnd or leg swelling, presyncope, palpitations, heartburn, abdominal pain, anorexia, nausea, vomiting, diarrhea  or change in bowel or urinary habits, change in stools or urine, dysuria,hematuria,  rash, arthralgias, visual complaints, headache, numbness weakness or ataxia or problems with walking or coordination,  change in mood/affect or memory.                 Objective:   Physical Exam  Pleasant amb bf nad   07/11/2013        172> 08/22/2013 176  Wt Readings from Last 3 Encounters:  06/27/13 171 lb 12.8 oz (77.928 kg)  06/23/13 170 lb 13.7 oz (77.5 kg)  11/13/11 197 lb (89.359 kg)       HEENT: nl dentition, turbinates, and orophanx. Nl external ear canals without cough reflex   NECK :  without JVD/Nodes/TM/ nl carotid upstrokes bilaterally   LUNGS: no acc muscle use, clear to A and P bilaterally with insp and exp rhonchi vs pseudowheeze transmitted    CV:  RRR  no s3 or murmur or increase in P2, no edema   ABD:  soft and nontender with nl excursion in the supine position. No bruits or organomegaly, bowel sounds nl  MS:  warm without deformities, calf tenderness, cyanosis or clubbing       CTa chest 06/23/13  1. No evidence of pulmonary embolus.  2. The stomach and esophagus are fluid filled and distended.  Gastroesophageal reflux may be present.  3. Biapical nodular pleural parenchymal thickening,  particularly on  the left. These changes are most likely secondary to scarring.          Assessment & Plan:

## 2013-09-05 ENCOUNTER — Ambulatory Visit: Payer: BC Managed Care – PPO | Admitting: Internal Medicine

## 2013-09-11 ENCOUNTER — Encounter: Payer: Self-pay | Admitting: Internal Medicine

## 2013-09-11 ENCOUNTER — Ambulatory Visit (INDEPENDENT_AMBULATORY_CARE_PROVIDER_SITE_OTHER): Payer: BC Managed Care – PPO | Admitting: Internal Medicine

## 2013-09-11 VITALS — BP 112/78 | HR 71 | Temp 98.1°F | Ht 65.5 in | Wt 177.0 lb

## 2013-09-11 DIAGNOSIS — J45991 Cough variant asthma: Secondary | ICD-10-CM

## 2013-09-11 DIAGNOSIS — R059 Cough, unspecified: Secondary | ICD-10-CM

## 2013-09-11 DIAGNOSIS — R05 Cough: Secondary | ICD-10-CM

## 2013-09-11 DIAGNOSIS — R058 Other specified cough: Secondary | ICD-10-CM

## 2013-09-11 NOTE — Assessment & Plan Note (Signed)
-   max acid suppression started 06/27/13  - sinus CT neg 07/24/13   The persistent pattern of daytime "drainage" is probably irritable larynx though clearly the response to dulera suggests that cough variant asthma is the predominant problem  Will add 1st gen h1 per guidelines / still concerned bcps adding to non acid gerd component to upper airway cough > strongly consider alternatives    Each maintenance medication was reviewed in detail including most importantly the difference between maintenance and as needed and under what circumstances the prns are to be used.  Please see instructions for details which were reviewed in writing and the patient given a copy.

## 2013-09-11 NOTE — Patient Instructions (Addendum)
For drainage take chlortrimeton (chlorpheniramine) 4 mg x 1 every 4 hours available over the counter (may cause drowsiness)   Increase Dulera 200 Take 2 puffs first thing in am and then another 2 puffs about 12 hours later  Please schedule a follow up office visit in 4 weeks, sooner if needed bring all inhalers with you

## 2013-09-11 NOTE — Progress Notes (Signed)
Subjective:    Patient ID: Marie Harper, female    DOB: December 29, 1975  MRN: [id number]     Brief patient profile:  [age] yobf never smoker no problems as child or young adult with tendency to bad head colds since early 2014 with Sinus xray neg 08/28/12 then abrupt onset  cough / wheeze /doe then Fall 2014 p developed sneezing/ head congestion  [facility] [address]  rx abx then worse again in November 2014 progressively worse  Despite rx and referred to Pulmonary clinic 06/28/13 by Cammy Fulp p admit   Admit date: 06/23/2013  Discharge date: 06/25/2013  Discharge Diagnoses:  CAP (community acquired pneumonia)  Anemia  Bronchospasm  Discharge Condition: stable  Diet recommendation: low sodium heart healthy  Filed Weights    06/23/13 1252   Weight:  77.5 kg (170 lb 13.7 oz)   History of present illness:  Marie Harper is a 38 y.o. female presented with a non resolving cough and SOB for several month, recently with fever, chills, night sweats; she was empirically treated outpatient with keflex, prednisone several times. ED revealed pneumonia on x ray, WBC of 15.5.  Hospital Course:  CAP  CXR lower lobe infiltrate,  CT showed biapical scarring. Needs a repeat CT in 6 months  Levaquin 750 IV while inpatient, will finish 5 day course outpatient.  Steroids for wheeze, tussionex and tessalon pearls for cough. Steroids discontinued on discharge.  Respiratory viral panel pending  Anemia  Appears chronic, Asymptomatic. Follow up outpatient. Procedures:  none Antibiotic  levaquin   06/27/2013 1st [facility] office visit/ Wert  Chief Complaint  Patient presents with  . Pulmonary Consult    Self referral- pt c/o cough and SOB since Nov 2014. Worse for the past month with recent hospitalization for PNA. Cough is prod with minimal clear sputum.   cough much worse when lie down with freq awakening > clear mucus was thick and green  , now clear saba helps wheeze, not  really helping the cough, not used since May 2 - not really sob unless coughing rec Pantoprazole (protonix) 40 mg   Take 30-60 min before first meal of the day and Pepcid 20 mg one bedtime    GERD diet   Dulera 100 Take 2 puffs first thing in am and then another 2 puffs about 12 hours later.  Prednisone 10 mg take  4 each am x 2 days,   2 each am x 2 days,  1 each am x 2 days and stop Use the cough medication   If not improving call [facility] [phone] for sinus CT scan before next visit.   07/11/2013 f/u ov/Wert re: dulera 100 2bid uses saba once a week since prev ov Chief Complaint  Patient presents with  . Follow-up    prod cough with yellow-green mucous in morning,s white-clear mucous in afternoons, less frequent since last visit.   Not limited by breathing from desired activities   rec Please see patient coordinator before you leave today  to schedule Sinus CT > neg  Continue dulera 100 Take 2 puffs first thing in am and then another 2 puffs about 12 hours later.  Only use your albuterol (proair) as rescue   08/22/2013 f/u ov/Wert re: chronic cough Nov 2014 / still on bcps/has not purchased dulera yet/ changed flonase to Merrill Lynchnasacort Chief Complaint  Patient presents with  . Follow-up    Pt states her cough is improved compared since her last  visit, but still has some cough- prod with minimal yellow sputum. She states that she just finished round of ceftin for a sinus infection  mostly coughing 10 min p stir in am then all day long and better while sleeping  Only used rescue saba x one since last ov  Denies missing doses of dulera but counts are way off  rec For drainage take chlortrimeton (chlorpheniramine) 4 mg every 4 hours available over the counter (may cause drowsiness)  Increase Dulera 200 Take 2 puffs first thing in am and then another 2 puffs about 12 hours later.  Please schedule a follow up office visit in 2 weeks, sooner if needed  Late add: check dulera count , given 15 d  sample only  Consider short term reglan rx next ov     09/11/2013 f/u ov/Wert re: variable nasal congestion/ daytime "drainage" never got h1  Chief Complaint  Patient presents with  . Follow-up    Pt states that her cough is some better since her last visit, but not resolved. She c/o feeling tired all of the time.      Not limited by breathing from desired activities / no need for saba    No obvious day to day or daytime variabilty or assoc   cp or chest tightness, subjective wheeze overt   hb symptoms. No unusual exp hx or h/o childhood pna/ asthma or knowledge of premature birth.  Sleeping ok without nocturnal  or early am exacerbation  of respiratory  c/o's or need for noct saba. Also denies any obvious fluctuation of symptoms with weather or environmental changes or other aggravating or alleviating factors except as outlined above   Current Medications, Allergies, Complete Past Medical History, Past Surgical History, Family History, and Social History were reviewed in [employer] [facility] record.  ROS  The following are not active complaints unless bolded sore throat, dysphagia, dental problems, itching, sneezing,  nasal congestion or excess/ purulent secretions, ear ache,   fever, chills, sweats, unintended wt loss, pleuritic or exertional cp, hemoptysis,  orthopnea pnd or leg swelling, presyncope, palpitations, heartburn, abdominal pain, anorexia, nausea, vomiting, diarrhea  or change in bowel or urinary habits, change in stools or urine, dysuria,hematuria,  rash, arthralgias, visual complaints, headache, numbness weakness or ataxia or problems with walking or coordination,  change in mood/affect or memory.                 Objective:   Physical Exam  Pleasant amb bf nad   07/11/2013        172> 08/22/2013 176 > 09/11/2013  177  Wt Readings from Last 3 Encounters:  06/27/13 171 lb 12.8 oz (77.928 kg)  06/23/13 170 lb 13.7 oz (77.5 kg)  11/13/11 197 lb (89.359 kg)        HEENT: nl dentition, turbinates, and orophanx. Nl external ear canals without cough reflex   NECK :  without JVD/Nodes/TM/ nl carotid upstrokes bilaterally   LUNGS: no acc muscle use, clear to A and P bilaterally with clear bs bilaterally    CV:  RRR  no s3 or murmur or increase in P2, no edema   ABD:  soft and nontender with nl excursion in the supine position. No bruits or organomegaly, bowel sounds nl  MS:  warm without deformities, calf tenderness, cyanosis or clubbing       CTa chest 06/23/13  1. No evidence of pulmonary embolus.  2. The stomach and esophagus are fluid filled and distended.  Gastroesophageal reflux may be present.  3. Biapical nodular pleural parenchymal thickening, particularly on  the left. These changes are most likely secondary to scarring.          Assessment & Plan:

## 2013-09-11 NOTE — Assessment & Plan Note (Addendum)
-   started on dulera 100 2bid 06/27/13> better 07/11/2013 - 07/24/13 Sinus CT > Slight mucosal thickening in the right maxillary sinus. Remainder of the paranasal sinuses are clear. No air-fluid levels. No acute bony abnormality. Orbital soft tissues are unremarkable. - spirometry  08/22/2013 min airflow obst reflected in fef 25-75 despite prominent "wheeze" on exam - 09/11/2013 p extensive coaching HFA effectiveness =    90%   Adequate control on present rx, reviewed > no change in rx needed

## 2013-10-09 ENCOUNTER — Ambulatory Visit (INDEPENDENT_AMBULATORY_CARE_PROVIDER_SITE_OTHER): Payer: BC Managed Care – PPO | Admitting: Internal Medicine

## 2013-10-09 ENCOUNTER — Encounter: Payer: Self-pay | Admitting: Internal Medicine

## 2013-10-09 VITALS — BP 128/88 | HR 80 | Ht 65.5 in | Wt 172.8 lb

## 2013-10-09 DIAGNOSIS — R05 Cough: Secondary | ICD-10-CM

## 2013-10-09 DIAGNOSIS — R058 Other specified cough: Secondary | ICD-10-CM

## 2013-10-09 DIAGNOSIS — J45991 Cough variant asthma: Secondary | ICD-10-CM

## 2013-10-09 DIAGNOSIS — R059 Cough, unspecified: Secondary | ICD-10-CM

## 2013-10-09 MED ORDER — MOMETASONE FURO-FORMOTEROL FUM 200-5 MCG/ACT IN AERO: INHALATION_SPRAY | RESPIRATORY_TRACT | Status: DC

## 2013-10-09 NOTE — Assessment & Plan Note (Signed)
-   max acid suppression started 06/27/13 > modified to h2 bid 10/09/2013  - sinus CT neg 07/24/13  - trial of chlortrimeton started 09/11/2013 >>  Not clear at this point how much gerd is contributing but note the abn esophagus on CT 06/23/13  See instructions for specific recommendations which were reviewed directly with the patient who was given a copy with highlighter outlining the key components.

## 2013-10-09 NOTE — Assessment & Plan Note (Signed)
-  started on dulera 100 2bid 06/27/13> better 07/11/2013 - 07/11/2013 p extensive coaching HFA effectiveness =    90% - 07/24/13 Sinus CT > Slight mucosal thickening in the right maxillary sinus. Remainder of the paranasal sinuses are clear. No air-fluid levels. No acute bony abnormality. Orbital soft tissues are unremarkable. - spirometry  08/22/2013 min airflow obst reflected in fef 25-75 despite prominent "wheeze" on exam  All goals of chronic asthma control met including optimal function and elimination of symptoms with minimal need for rescue therapy.  Contingencies discussed in full including contacting this office immediately if not controlling the symptoms using the rule of two's.

## 2013-10-09 NOTE — Progress Notes (Signed)
Subjective:    Patient ID: Marie Harper, female    DOB: December 29, 1975  MRN: [id number]     Brief patient profile:  [age] yobf never smoker no problems as child or young adult with tendency to bad head colds since early 2014 with Sinus xray neg 08/28/12 then abrupt onset  cough / wheeze /doe then Fall 2014 p developed sneezing/ head congestion  [facility] [address]  rx abx then worse again in November 2014 progressively worse  Despite rx and referred to Pulmonary clinic 06/28/13 by Cammy Fulp p admit   Admit date: 06/23/2013  Discharge date: 06/25/2013  Discharge Diagnoses:  CAP (community acquired pneumonia)  Anemia  Bronchospasm  Discharge Condition: stable  Diet recommendation: low sodium heart healthy  Filed Weights    06/23/13 1252   Weight:  77.5 kg (170 lb 13.7 oz)   History of present illness:  Marie Harper is a 38 y.o. female presented with a non resolving cough and SOB for several month, recently with fever, chills, night sweats; she was empirically treated outpatient with keflex, prednisone several times. ED revealed pneumonia on x ray, WBC of 15.5.  Hospital Course:  CAP  CXR lower lobe infiltrate,  CT showed biapical scarring. Needs a repeat CT in 6 months  Levaquin 750 IV while inpatient, will finish 5 day course outpatient.  Steroids for wheeze, tussionex and tessalon pearls for cough. Steroids discontinued on discharge.  Respiratory viral panel pending  Anemia  Appears chronic, Asymptomatic. Follow up outpatient. Procedures:  none Antibiotic  levaquin   06/27/2013 1st [place] [facility] office visit/ Tzippy Testerman  Chief Complaint  Patient presents with  . Pulmonary Consult    Self referral- pt c/o cough and SOB since Nov 2014. Worse for the past month with recent hospitalization for PNA. Cough is prod with minimal clear sputum.   cough much worse when lie down with freq awakening > clear mucus was thick and green  , now clear saba helps wheeze, not  really helping the cough, not used since May 2 - not really sob unless coughing rec Pantoprazole (protonix) 40 mg   Take 30-60 min before first meal of the day and Pepcid 20 mg one bedtime    GERD diet   Dulera 100 Take 2 puffs first thing in am and then another 2 puffs about 12 hours later.  Prednisone 10 mg take  4 each am x 2 days,   2 each am x 2 days,  1 each am x 2 days and stop Use the cough medication   If not improving call [facility] [phone] for sinus CT scan before next visit.   07/11/2013 f/u ov/Janellie Tennison re: dulera 100 2bid uses saba once a week since prev ov Chief Complaint  Patient presents with  . Follow-up    prod cough with yellow-green mucous in morning,s white-clear mucous in afternoons, less frequent since last visit.   Not limited by breathing from desired activities   rec Please see patient coordinator before you leave today  to schedule Sinus CT > neg  Continue dulera 100 Take 2 puffs first thing in am and then another 2 puffs about 12 hours later.  Only use your albuterol (proair) as rescue   08/22/2013 f/u ov/Rosland Riding re: chronic cough Nov 2014 / still on bcps/has not purchased dulera yet/ changed flonase to Merrill Lynchnasacort Chief Complaint  Patient presents with  . Follow-up    Pt states her cough is improved compared since her last  visit, but still has some cough- prod with minimal yellow sputum. She states that she just finished round of ceftin for a sinus infection  mostly coughing 10 min p stir in am then all day long and better while sleeping  Only used rescue saba x one since last ov  Denies missing doses of dulera but counts are way off  rec For drainage take chlortrimeton (chlorpheniramine) 4 mg every 4 hours available over the counter (may cause drowsiness)  Increase Dulera 200 Take 2 puffs first thing in am and then another 2 puffs about 12 hours later.  Please schedule a follow up office visit in 2 weeks, sooner if needed  Late add: check dulera count , given 15 d  sample only  Consider short term reglan rx next ov     09/11/2013 f/u ov/Izola Teague re: variable nasal congestion/ daytime "drainage" never got h1  Chief Complaint  Patient presents with  . Follow-up    Pt states that her cough is some better since her last visit, but not resolved. She c/o feeling tired all of the time.   Not limited by breathing from desired activities / no need for saba  rec For drainage take chlortrimeton (chlorpheniramine) 4 mg x 1 every 4 hours available over the counter (may cause drowsiness)  Increase Dulera 200 Take 2 puffs first thing in am and then another 2 puffs about 12 hours later   10/09/2013 f/u ov/Bernetta Sutley re: chronic asthma  Chief Complaint  Patient presents with  . Follow-up    Pt states that cough is much improved. Pt did not bring inhalers to visit.    Not limited by breathing from desired activities  - no need for rescue saba   No obvious day to day or daytime variabilty or assoc   cp or chest tightness, subjective wheeze overt   hb symptoms. No unusual exp hx or h/o childhood pna/ asthma or knowledge of premature birth.  Sleeping ok without nocturnal  or early am exacerbation  of respiratory  c/o's or need for noct saba. Also denies any obvious fluctuation of symptoms with weather or environmental changes or other aggravating or alleviating factors except as outlined above   Current Medications, Allergies, Complete Past Medical History, Past Surgical History, Family History, and Social History were reviewed in [employer] record.  ROS  The following are not active complaints unless bolded sore throat, dysphagia, dental problems, itching, sneezing,  nasal congestion or excess/ purulent secretions, ear ache,   fever, chills, sweats, unintended wt loss, pleuritic or exertional cp, hemoptysis,  orthopnea pnd or leg swelling, presyncope, palpitations, heartburn, abdominal pain, anorexia, nausea, vomiting, diarrhea  or change in bowel or  urinary habits, change in stools or urine, dysuria,hematuria,  rash, arthralgias, visual complaints, headache, numbness weakness or ataxia or problems with walking or coordination,  change in mood/affect or memory.                 Objective:   Physical Exam  Pleasant amb bf nad   07/11/2013        172> 08/22/2013 176 > 09/11/2013  177 > 10/09/2013  173  Wt Readings from Last 3 Encounters:  06/27/13 171 lb 12.8 oz (77.928 kg)  06/23/13 170 lb 13.7 oz (77.5 kg)  11/13/11 197 lb (89.359 kg)       HEENT: nl dentition, turbinates, and orophanx. Nl external ear canals without cough reflex   NECK :  without JVD/Nodes/TM/ nl carotid upstrokes bilaterally  LUNGS: no acc muscle use, clear to A and P bilaterally with clear bs bilaterally    CV:  RRR  no s3 or murmur or increase in P2, no edema   ABD:  soft and nontender with nl excursion in the supine position. No bruits or organomegaly, bowel sounds nl  MS:  warm without deformities, calf tenderness, cyanosis or clubbing       CTa chest 06/23/13  1. No evidence of pulmonary embolus.  2. The stomach and esophagus are fluid filled and distended.  Gastroesophageal reflux may be present.  3. Biapical nodular pleural parenchymal thickening, particularly on  the left. These changes are most likely secondary to scarring.          Assessment & Plan:

## 2013-10-09 NOTE — Patient Instructions (Addendum)
Change acid suppression to where just take the pepcid after first meal and at bedtime and if you notice no difference after a month stop the am pepcid   Stay on dulera 200 Take 2 puffs first thing in am and then another 2 puffs about 12 hours later.     Please schedule a follow up visit in 3 months but call sooner if needed

## 2013-12-24 ENCOUNTER — Encounter: Payer: Self-pay | Admitting: Internal Medicine

## 2014-01-01 ENCOUNTER — Telehealth: Payer: Self-pay | Admitting: Internal Medicine

## 2014-01-01 MED ORDER — MOMETASONE FURO-FORMOTEROL FUM 200-5 MCG/ACT IN AERO: INHALATION_SPRAY | RESPIRATORY_TRACT | Status: DC

## 2014-01-01 NOTE — Telephone Encounter (Signed)
Pt aware RX sent in. Nothing further needed 

## 2014-01-03 ENCOUNTER — Ambulatory Visit (INDEPENDENT_AMBULATORY_CARE_PROVIDER_SITE_OTHER): Payer: BC Managed Care – PPO | Admitting: Podiatry

## 2014-01-03 ENCOUNTER — Ambulatory Visit (INDEPENDENT_AMBULATORY_CARE_PROVIDER_SITE_OTHER): Payer: BC Managed Care – PPO

## 2014-01-03 VITALS — BP 120/85 | HR 73 | Resp 16 | Ht 65.0 in | Wt 170.0 lb

## 2014-01-03 DIAGNOSIS — M722 Plantar fascial fibromatosis: Secondary | ICD-10-CM

## 2014-01-03 MED ORDER — METHYLPREDNISOLONE (PAK) 4 MG PO TABS: ORAL_TABLET | ORAL | Status: DC

## 2014-01-03 NOTE — Patient Instructions (Signed)

## 2014-01-03 NOTE — Progress Notes (Signed)
   Subjective:    Patient ID: Marie Harper, female    DOB: 08/30/1975, 38 y.o.   MRN: [id number]  HPI Comments: Right plantar heel pain for at least four months , was having knee pain as well and went to the orthopedic and he told me just exercises that did not help. It throbs sharp knife pain. Mornings i can barely walk   Foot Pain      Review of Systems  All other systems reviewed and are negative.      Objective:   Physical Exam: I have reviewed past medical history medications allergies surgery social history and review of systems. Pulses are strongly palpable. She has pain on palpation medial continued tubercle of the right heel. Radiographs confirm soft tissue increase in density at the plantar fascial insertion site.        Assessment & Plan:  Assessment: Plantar fasciitis right.  Plan: Start her on a Medrol Dosepak to be followed by meloxicam. Discussed appropriate shoe gear stretching exercises ice therapy and shoe gear modifications. Injected the right heel today with Kenalog and local anesthetic put her in a plantar fascial brace she already has a night splint at home which she will utilize. She will continue use of her orthotics and evaluate any paratenon issues. I will follow-up with her in 1 month. We did discuss the possible need for surgical intervention for her bunion.

## 2014-01-07 ENCOUNTER — Encounter: Payer: Self-pay | Admitting: Internal Medicine

## 2014-01-07 ENCOUNTER — Ambulatory Visit (INDEPENDENT_AMBULATORY_CARE_PROVIDER_SITE_OTHER): Payer: BC Managed Care – PPO | Admitting: Internal Medicine

## 2014-01-07 VITALS — BP 114/80 | HR 80 | Ht 65.5 in | Wt 173.0 lb

## 2014-01-07 DIAGNOSIS — M5090 Cervical disc disorder, unspecified, unspecified cervical region: Secondary | ICD-10-CM

## 2014-01-07 DIAGNOSIS — J45991 Cough variant asthma: Secondary | ICD-10-CM

## 2014-01-07 MED ORDER — AMOXICILLIN-POT CLAVULANATE 875-125 MG PO TABS: 1.0000 | ORAL_TABLET | Freq: Two times a day (BID) | ORAL | Status: DC

## 2014-01-07 NOTE — Patient Instructions (Signed)
Augmentin 875 mg take one pill twice daily  X 10 days - take at breakfast and supper with large glass of water.  It would help reduce the usual side effects (diarrhea and yeast infections) if you ate cultured yogurt at lunch.   Please schedule a follow up visit in 3 months but call sooner if needed

## 2014-01-07 NOTE — Progress Notes (Signed)
Subjective:    Patient ID: Marie Harper, female    DOB: Oct 25, 1975  MRN: [id number]     Brief patient profile:  [age] yobf never smoker no problems as child or young adult with tendency to bad head colds since early 2014 with Sinus xray neg 08/28/12 then abrupt onset  cough / wheeze /doe then Fall 2014 p developed sneezing/ head congestion  [facility] [address]  rx abx then worse again in November 2014 progressively worse  Despite rx and referred to Pulmonary clinic 06/28/13 by Cammy Fulp p admit   Admit date: 06/23/2013  Discharge date: 06/25/2013  Discharge Diagnoses:  CAP (community acquired pneumonia)  Anemia  Bronchospasm  Discharge Condition: stable  Diet recommendation: low sodium heart healthy  Filed Weights    06/23/13 1252   Weight:  77.5 kg (170 lb 13.7 oz)   History of present illness:  Marie Harper is a 38 y.o. female presented with a non resolving cough and SOB for several month, recently with fever, chills, night sweats; she was empirically treated outpatient with keflex, prednisone several times. ED revealed pneumonia on x ray, WBC of 15.5.  Hospital Course:  CAP  CXR lower lobe infiltrate,  CT showed biapical scarring. Needs a repeat CT in 6 months  Levaquin 750 IV while inpatient, will finish 5 day course outpatient.  Steroids for wheeze, tussionex and tessalon pearls for cough. Steroids discontinued on discharge.  Respiratory viral panel pending  Anemia  Appears chronic, Asymptomatic. Follow up outpatient. Procedures:  none Antibiotic  levaquin   06/27/2013 1st [place] [facility] office visit/ Aithana Kushner  Chief Complaint  Patient presents with  . Pulmonary Consult    Self referral- pt c/o cough and SOB since Nov 2014. Worse for the past month with recent hospitalization for PNA. Cough is prod with minimal clear sputum.   cough much worse when lie down with freq awakening > clear mucus was thick and green  , now clear saba helps wheeze, not  really helping the cough, not used since May 2 - not really sob unless coughing rec Pantoprazole (protonix) 40 mg   Take 30-60 min before first meal of the day and Pepcid 20 mg one bedtime    GERD diet   Dulera 100 Take 2 puffs first thing in am and then another 2 puffs about 12 hours later.  Prednisone 10 mg take  4 each am x 2 days,   2 each am x 2 days,  1 each am x 2 days and stop Use the cough medication   If not improving call [facility] [phone] for sinus CT scan before next visit.   07/11/2013 f/u ov/Olaoluwa Grieder re: dulera 100 2bid uses saba once a week since prev ov Chief Complaint  Patient presents with  . Follow-up    prod cough with yellow-green mucous in morning,s white-clear mucous in afternoons, less frequent since last visit.   Not limited by breathing from desired activities   rec Please see patient coordinator before you leave today  to schedule Sinus CT > neg  Continue dulera 100 Take 2 puffs first thing in am and then another 2 puffs about 12 hours later.  Only use your albuterol (proair) as rescue   08/22/2013 f/u ov/Jamauri Kruzel re: chronic cough Nov 2014 / still on bcps/has not purchased dulera yet/ changed flonase to Merrill Lynchnasacort Chief Complaint  Patient presents with  . Follow-up    Pt states her cough is improved compared since her last  visit, but still has some cough- prod with minimal yellow sputum. She states that she just finished round of ceftin for a sinus infection  mostly coughing 10 min p stir in am then all day long and better while sleeping  Only used rescue saba x one since last ov  Denies missing doses of dulera but counts are way off  rec For drainage take chlortrimeton (chlorpheniramine) 4 mg every 4 hours available over the counter (may cause drowsiness)  Increase Dulera 200 Take 2 puffs first thing in am and then another 2 puffs about 12 hours later.  Please schedule a follow up office visit in 2 weeks, sooner if needed  Late add: check dulera count , given 15 d  sample only  Consider short term reglan rx next ov     09/11/2013 f/u ov/Lennyx Verdell re: variable nasal congestion/ daytime "drainage" never got h1  Chief Complaint  Patient presents with  . Follow-up    Pt states that her cough is some better since her last visit, but not resolved. She c/o feeling tired all of the time.   Not limited by breathing from desired activities / no need for saba  rec For drainage take chlortrimeton (chlorpheniramine) 4 mg x 1 every 4 hours available over the counter (may cause drowsiness)  Increase Dulera 200 Take 2 puffs first thing in am and then another 2 puffs about 12 hours later   10/09/2013 f/u ov/Maribelle Hopple re: chronic asthma  Chief Complaint  Patient presents with  . Follow-up    Pt states that cough is much improved. Pt did not bring inhalers to visit.    Not limited by breathing from desired activities  - no need for rescue saba rec Change acid suppression to where just take the pepcid after first meal and at bedtime and if you notice no difference after a month stop the am pepcid  Stay on dulera 200 Take 2 puffs first thing in am and then another 2 puffs about 12 hours later.      01/07/2014 f/u ov/Macie Baum re: asthma/ rhinitis Chief Complaint  Patient presents with  . Follow-up    Pt states overall her breathing is doing well. Her cough has been more prod over the past few days- brown/yellow/green sputum.    onset Jan 03 2014 cough worse at [facility] with nasal congestion also - until then doing great with no need for saba though dulera count is way off as still using a coupon for free dulera from last ov and hasn't needed to purchase through her insurance yet    No obvious day to day or daytime variabilty or assoc   cp or chest tightness, subjective wheeze overt   hb symptoms. No unusual exp hx or h/o childhood pna/ asthma or knowledge of premature birth.  Sleeping ok without nocturnal  or early am exacerbation  of respiratory  c/o's or need for noct saba.  Also denies any obvious fluctuation of symptoms with weather or environmental changes or other aggravating or alleviating factors except as outlined above   Current Medications, Allergies, Complete Past Medical History, Past Surgical History, Family History, and Social History were reviewed in [employer] record.  ROS  The following are not active complaints unless bolded sore throat, dysphagia, dental problems, itching, sneezing,  nasal congestion or excess/ purulent secretions, ear ache,   fever, chills, sweats, unintended wt loss, pleuritic or exertional cp, hemoptysis,  orthopnea pnd or leg swelling, presyncope, palpitations, heartburn, abdominal pain,  anorexia, nausea, vomiting, diarrhea  or change in bowel or urinary habits, change in stools or urine, dysuria,hematuria,  rash, arthralgias, visual complaints, headache, numbness weakness or ataxia or problems with walking or coordination,  change in mood/affect or memory.                 Objective:   Physical Exam  Pleasant amb bf nad   07/11/2013        172> 08/22/2013 176 > 09/11/2013  177 > 10/09/2013  173 > 01/07/2014 173  Wt Readings from Last 3 Encounters:  06/27/13 171 lb 12.8 oz (77.928 kg)  06/23/13 170 lb 13.7 oz (77.5 kg)  11/13/11 197 lb (89.359 kg)       HEENT: nl dentition, turbinates, and orophanx. Nl external ear canals without cough reflex   NECK :  without JVD/Nodes/TM/ nl carotid upstrokes bilaterally   LUNGS: no acc muscle use, clear to A and P bilaterally with clear bs bilaterally    CV:  RRR  no s3 or murmur or increase in P2, no edema   ABD:  soft and nontender with nl excursion in the supine position. No bruits or organomegaly, bowel sounds nl  MS:  warm without deformities, calf tenderness, cyanosis or clubbing       CTa chest 06/23/13  1. No evidence of pulmonary embolus.  2. The stomach and esophagus are fluid filled and distended.  Gastroesophageal reflux may be present.   3. Biapical nodular pleural parenchymal thickening, particularly on  the left. These changes are most likely secondary to scarring.          Assessment & Plan:

## 2014-01-08 NOTE — Assessment & Plan Note (Addendum)
-   started on dulera 100 2bid 06/27/13> better 07/11/2013 - 07/11/2013 p extensive coaching HFA effectiveness =    90% - 07/24/13 Sinus CT > Slight mucosal thickening in the right maxillary sinus. Remainder of the paranasal sinuses are clear. No air-fluid levels. No acute bony abnormality. Orbital soft tissues are unremarkable. - spirometry  08/22/2013 min airflow obst reflected in fef 25-75 despite prominent "wheeze" on exam - 09/11/13 increased dulera to 200 2bid due to concern not using the dulera frequently enough  DDX of  difficult airways management all start with A and  include Adherence, Ace Inhibitors, Acid Reflux, Active Sinus Disease, Alpha 1 Antitripsin deficiency, Anxiety masquerading as Airways dz,  ABPA,  allergy(esp in young), Aspiration (esp in elderly), Adverse effects of DPI,  Active smokers, plus two Bs  = Bronchiectasis and Beta blocker use..and one C= CHF  Adherence is always the initial "prime suspect" and is a multilayered concern that requires a "trust but verify" approach in every patient - starting with knowing how to use medications, especially inhalers, correctly, keeping up with refills and understanding the fundamental difference between maintenance and prns vs those medications only taken for a very short course and then stopped and not refilled.  - documented non adherence reviewed s confronting pt that her counts were off, reiterated that if she missed a dose of dulera she should immediately take it rather than go back to relying on saba  ? Acid (or non-acid) GERD > always difficult to exclude as up to 75% of pts in some series report no assoc GI/ Heartburn symptoms> rec continue max (24h)  noct acid suppression and diet restrictions/ reviewed     ? Active sinus dz > neg sinut ct in 07/2013 noted so no need to repeat > rx with Augmentin 875 mg take one pill twice daily  X 10 days      Each maintenance medication was reviewed in detail including most importantly the difference  between maintenance and as needed and under what circumstances the prns are to be used.  Please see instructions for details which were reviewed in writing and the patient given a copy.

## 2014-01-31 ENCOUNTER — Ambulatory Visit: Payer: BC Managed Care – PPO | Admitting: Podiatry

## 2014-06-06 ENCOUNTER — Ambulatory Visit (INDEPENDENT_AMBULATORY_CARE_PROVIDER_SITE_OTHER): Payer: BLUE CROSS/BLUE SHIELD | Admitting: Internal Medicine

## 2014-06-06 ENCOUNTER — Encounter: Payer: Self-pay | Admitting: Internal Medicine

## 2014-06-06 VITALS — BP 126/74 | HR 85 | Ht 65.5 in | Wt 174.0 lb

## 2014-06-06 DIAGNOSIS — J45991 Cough variant asthma: Secondary | ICD-10-CM | POA: Diagnosis not present

## 2014-06-06 MED ORDER — PANTOPRAZOLE SODIUM 40 MG PO TBEC: 40.0000 mg | DELAYED_RELEASE_TABLET | Freq: Every day | ORAL | Status: DC

## 2014-06-06 MED ORDER — FAMOTIDINE 20 MG PO TABS: ORAL_TABLET | ORAL | Status: DC

## 2014-06-06 NOTE — Patient Instructions (Addendum)
Dulera 200 Take 2 puffs first thing in am and then another 2 puffs about 12 hours later until 100% then ok to taper to see if flare  Try protonix (pantoprazole) 40 mg  Take 30-60 min before first meal of the day and Pepcid 20 mg one bedtime until cough/wheezing/completely gone is completely gone for at least a week without the need for cough suppression  GERD (REFLUX)  is an extremely common cause of respiratory symptoms just like yours , many times with no obvious heartburn at all.    It can be treated with medication, but also with lifestyle changes including avoidance of late meals, excessive alcohol, smoking cessation, and avoid fatty foods, chocolate, peppermint, colas, red wine, and acidic juices such as orange juice.  NO MINT OR MENTHOL PRODUCTS SO NO COUGH DROPS  USE SUGARLESS CANDY INSTEAD (Jolley ranchers or Stover's or Life Savers) or even ice chips will also do - the key is to swallow to prevent all throat clearing. NO OIL BASED VITAMINS - use powdered substitutes.     Only use your albuterol as a rescue medication to be used if you can't catch your breath by resting or doing a relaxed purse lip breathing pattern.  - The less you use it, the better it will work when you need it. - Ok to use up to 2 puffs  every 4 hours if you must but call for immediate appointment if use goes up over your usual need - Don't leave home without it !!  (think of it like the spare tire for your car)   If not 100% better return

## 2014-06-06 NOTE — Progress Notes (Signed)
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 with tendency to bad head colds since early 2014 with Sinus xray neg 08/28/12 then abrupt onset  cough / wheeze /doe then Fall 2014 p developed sneezing/ head congestion  dx'd as sinusitis at [facility] on [address]  rx abx then worse again in November 2014 progressively worse  Despite rx and referred to Pulmonary clinic 06/28/13 by Cammy Fulp p admit   Admit date: 06/23/2013  Discharge date: 06/25/2013  Discharge Diagnoses:  CAP (community acquired pneumonia)  Anemia  Bronchospasm      Diet recommendation: low sodium heart healthy  Filed Weights    06/23/13 1252   Weight:  77.5 kg (170 lb 13.7 oz)   History of present illness:  Marie Harper is a 39 y.o. female presented with a non resolving cough and SOB for several month, recently with fever, chills, night sweats; she was empirically treated outpatient with keflex, prednisone several times. ED revealed pneumonia on x ray, WBC of 15.5.  Hospital Course:  CAP  CXR lower lobe infiltrate,  CT showed biapical scarring. Needs a repeat CT in 6 months  Levaquin 750 IV while inpatient, will finish 5 day course outpatient.  Steroids for wheeze, tussionex and tessalon pearls for cough. Steroids discontinued on discharge.  Respiratory viral panel pending  Anemia  Appears chronic, Asymptomatic. Follow up outpatient. Procedures:  none Antibiotic  levaquin   06/27/2013 1st [place] [facility] office visit/ Benigno Check  Chief Complaint  Patient presents with  . Pulmonary Consult    Self referral- pt c/o cough and SOB since Nov 2014. Worse for the past month with recent hospitalization for PNA. Cough is prod with minimal clear sputum.   cough much worse when lie down with freq awakening > clear mucus was thick and green  , now clear saba helps wheeze, not really helping the  cough, not used since May 2 - not really sob unless coughing rec Pantoprazole (protonix) 40 mg   Take 30-60 min before first meal of the day and Pepcid 20 mg one bedtime    GERD diet   Dulera 100 Take 2 puffs first thing in am and then another 2 puffs about 12 hours later.  Prednisone 10 mg take  4 each am x 2 days,   2 each am x 2 days,  1 each am x 2 days and stop Use the cough medication   If not improving call [facility] [phone] for sinus CT scan before next visit.   07/11/2013 f/u ov/Caterina Racine re: dulera 100 2bid uses saba once a week since prev ov Chief Complaint  Patient presents with  . Follow-up    prod cough with yellow-green mucous in morning,s white-clear mucous in afternoons, less frequent since last visit.   Not limited by breathing from desired activities   rec Please see patient coordinator before you leave today  to schedule Sinus CT > neg  Continue dulera 100 Take 2 puffs first thing in am and then another 2 puffs about 12 hours later.  Only use your albuterol (proair) as rescue   08/22/2013 f/u ov/Stuart Mirabile re: chronic cough Nov 2014 / still on bcps/has not purchased dulera yet/ changed flonase to [employer] Complaint  Patient presents with  . Follow-up    Pt states her cough is improved compared since her  last visit, but still has some cough- prod with minimal yellow sputum. She states that she just finished round of ceftin for a sinus infection  mostly coughing 10 min p stir in am then all day long and better while sleeping  Only used rescue saba x one since last ov  Denies missing doses of dulera but counts are way off  rec For drainage take chlortrimeton (chlorpheniramine) 4 mg every 4 hours available over the counter (may cause drowsiness)  Increase Dulera 200 Take 2 puffs first thing in am and then another 2 puffs about 12 hours later.  Please schedule a follow up office visit in 2 weeks, sooner if needed  Late add: check dulera count , given 15 d sample only  Consider  short term reglan rx next ov     09/11/2013 f/u ov/Dillie Burandt re: variable nasal congestion/ daytime "drainage" never got h1  Chief Complaint  Patient presents with  . Follow-up    Pt states that her cough is some better since her last visit, but not resolved. She c/o feeling tired all of the time.   Not limited by breathing from desired activities / no need for saba  rec For drainage take chlortrimeton (chlorpheniramine) 4 mg x 1 every 4 hours available over the counter (may cause drowsiness)  Increase Dulera 200 Take 2 puffs first thing in am and then another 2 puffs about 12 hours later   10/09/2013 f/u ov/Cayetano Mikita re: chronic asthma  Chief Complaint  Patient presents with  . Follow-up    Pt states that cough is much improved. Pt did not bring inhalers to visit.   Not limited by breathing from desired activities  - no need for rescue saba rec Change acid suppression to where just take the pepcid after first meal and at bedtime and if you notice no difference after a month stop the am pepcid  Stay on dulera 200 Take 2 puffs first thing in am and then another 2 puffs about 12 hours later.      01/07/2014 f/u ov/Aiva Miskell re: asthma/ rhinitis Chief Complaint  Patient presents with  . Follow-up    Pt states overall her breathing is doing well. Her cough has been more prod over the past few days- brown/yellow/green sputum.    onset Jan 03 2014 cough worse at hs assoc with nasal congestion also - until then doing great with no need for saba though dulera count is way off as still using a coupon for free dulera from last ov and hasn't needed to purchase through her insurance yet rec Augmentin 875 mg take one pill twice daily  X 10 day   06/06/2014 acute ov/Avelyn Touch re: asthma flare  Chief Complaint  Patient presents with  . Follow-up    Pt states her breathing is doing well today. She was seen by PCP approx 1 wk ago with SOB and cough and was advised she had allergies. She has still had some cough-  non prod.   from last ov until 2 weeks prior to OV  Did fine on dulera 200 tiw / no rescue then acute onset sore throat/ some aches no fever p exp to sick daughter and started using rescue   Primary rec pred but did not take it  - did increase dulera to 200 2bid and still using saba more that twice daily but improved overall  Has gerd rx but not taking consistently and using cough drops  No abx / mucus was discolored but now  clear     No obvious day to day or daytime variabilty or assoc  cp or chest tightness, subjective wheeze overt   hb symptoms. No unusual exp hx or h/o childhood pna/ asthma or knowledge of premature birth.  Sleeping ok without nocturnal  or early am exacerbation  of respiratory  c/o's or need for noct saba. Also denies any obvious fluctuation of symptoms with weather or environmental changes or other aggravating or alleviating factors except as outlined above   Current Medications, Allergies, Complete Past Medical History, Past Surgical History, Family History, and Social History were reviewed in [employer] record.  ROS  The following are not active complaints unless bolded sore throat, dysphagia, dental problems, itching, sneezing,  nasal congestion or excess/ purulent secretions, ear ache,   fever, chills, sweats, unintended wt loss, pleuritic or exertional cp, hemoptysis,  orthopnea pnd or leg swelling, presyncope, palpitations, heartburn, abdominal pain, anorexia, nausea, vomiting, diarrhea  or change in bowel or urinary habits, change in stools or urine, dysuria,hematuria,  rash, arthralgias, visual complaints, headache, numbness weakness or ataxia or problems with walking or coordination,  change in mood/affect or memory.                 Objective:   Physical Exam  Pleasant amb bf nad   07/11/2013        172> 08/22/2013 176 > 09/11/2013  177 > 10/09/2013  173 > 01/07/2014 173 > 06/06/2014  174  Wt Readings from Last 3 Encounters:  06/27/13 171  lb 12.8 oz (77.928 kg)  06/23/13 170 lb 13.7 oz (77.5 kg)  11/13/11 197 lb (89.359 kg)       HEENT: nl dentition, turbinates, and orophanx. Nl external ear canals without cough reflex   NECK :  without JVD/Nodes/TM/ nl carotid upstrokes bilaterally   LUNGS: no acc muscle use, clear to A and P bilaterally with clear bs bilaterally    CV:  RRR  no s3 or murmur or increase in P2, no edema   ABD:  soft and nontender with nl excursion in the supine position. No bruits or organomegaly, bowel sounds nl  MS:  warm without deformities, calf tenderness, cyanosis or clubbing       CTa chest 06/23/13  1. No evidence of pulmonary embolus.  2. The stomach and esophagus are fluid filled and distended.  Gastroesophageal reflux may be present.  3. Biapical nodular pleural parenchymal thickening, particularly on  the left. These changes are most likely secondary to scarring.          Assessment & Plan:

## 2014-06-09 ENCOUNTER — Encounter: Payer: Self-pay | Admitting: Internal Medicine

## 2014-06-09 NOTE — Assessment & Plan Note (Addendum)
-   started on dulera 100 2bid 06/27/13> better 07/11/2013 - 07/11/2013 p extensive coaching HFA effectiveness =    90% - 07/24/13 Sinus CT > Slight mucosal thickening in the right maxillary sinus. Remainder of the paranasal sinuses are clear. No air-fluid levels. No acute bony abnormality. Orbital soft tissues are unremarkable. - spirometry  08/22/2013 min airflow obst reflected in fef 25-75 despite prominent "wheeze" on exam - 09/11/13 increased dulera to 200 2bid due to concern not using the dulera frequently enough  Already self managed reasonable well with increased dulera 200 and prn saba  Explained the natural history of uri and why it's necessary in patients at risk to treat GERD aggressively - at least  short term -   to reduce risk of evolving cyclical cough initially  triggered by epithelial injury and a heightened sensitivty to the effects of any upper airway irritants,  most importantly acid - related - then perpetuated by epithelial injury related to the cough itself as the upper airway collapses on itself.  That is, the more sensitive the epithelium becomes once it is damaged by the virus, the more the ensuing irritability> the more the cough, the more the secondary reflux (especially in those prone to reflux) the more the irritation of the sensitive mucosa and so on in a  Classic cyclical pattern.    Pulmonary f/u can be prn

## 2014-12-03 ENCOUNTER — Telehealth: Payer: Self-pay | Admitting: Internal Medicine

## 2014-12-03 ENCOUNTER — Ambulatory Visit (INDEPENDENT_AMBULATORY_CARE_PROVIDER_SITE_OTHER): Payer: BLUE CROSS/BLUE SHIELD | Admitting: Internal Medicine

## 2014-12-03 ENCOUNTER — Encounter: Payer: Self-pay | Admitting: Internal Medicine

## 2014-12-03 VITALS — BP 136/86 | HR 69 | Temp 97.2°F | Ht 65.5 in | Wt 179.8 lb

## 2014-12-03 DIAGNOSIS — J45991 Cough variant asthma: Secondary | ICD-10-CM

## 2014-12-03 MED ORDER — AMOXICILLIN-POT CLAVULANATE 875-125 MG PO TABS: 1.0000 | ORAL_TABLET | Freq: Two times a day (BID) | ORAL | Status: DC

## 2014-12-03 MED ORDER — MOMETASONE FURO-FORMOTEROL FUM 100-5 MCG/ACT IN AERO: INHALATION_SPRAY | RESPIRATORY_TRACT | Status: DC

## 2014-12-03 MED ORDER — PREDNISONE 10 MG PO TABS: ORAL_TABLET | ORAL | Status: DC

## 2014-12-03 NOTE — Patient Instructions (Signed)
Change dulera to 100 Take 2 puffs first thing in am and then another 2 puffs about 12 hours later.   For drainage / throat tickle try take CHLORPHENIRAMINE  4 mg - take one every 4 hours as needed - available over the counter- may cause drowsiness so start with just a bedtime dose or two and see how you tolerate it before trying in daytime    Augmentin 875 mg take one pill twice daily  X 10 days - take at breakfast and supper with large glass of water.  It would help reduce the usual side effects (diarrhea and yeast infections) if you ate cultured yogurt at lunch.  If not better then take Prednisone 10 mg take  4 each am x 2 days,   2 each am x 2 days,  1 each am x 2 days and stop   Please schedule a follow up office visit in 6 weeks, call sooner if needed

## 2014-12-03 NOTE — Telephone Encounter (Signed)
Attempted to call pt Line busy and no option to leave message  Will call back later

## 2014-12-03 NOTE — Progress Notes (Signed)
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 tendency to bad head colds since early 2014 with Sinus xray neg 08/28/12 then abrupt onset  cough / wheeze /doe then Fall 2014 p developed sneezing/ head congestion  dx'd as sinusitis at [facility] on [address]  rx abx then worse again in November 2014 progressively worse  Despite rx and referred to Pulmonary clinic 06/28/13 by Marie Harper p admit   Admit date: 06/23/2013  Discharge date: 06/25/2013  Discharge Diagnoses:  CAP (community acquired pneumonia)  Anemia  Bronchospasm      Diet recommendation: low sodium heart healthy  Filed Weights    06/23/13 1252   Weight:  77.5 kg (170 lb 13.7 oz)   History of present illness:  Marie Harper is a 39 y.o. female presented with a non resolving cough and SOB for several month, recently with fever, chills, night sweats; she was empirically treated outpatient with keflex, prednisone several times. ED revealed pneumonia on x ray, WBC of 15.5.  Hospital Course:  CAP  CXR lower lobe infiltrate,  CT showed biapical scarring. Needs a repeat CT in 6 months  Levaquin 750 IV while inpatient, will finish 5 day course outpatient.  Steroids for wheeze, tussionex and tessalon pearls for cough. Steroids discontinued on discharge.  Respiratory viral panel pending  Anemia  Appears chronic, Asymptomatic. Follow up outpatient. Procedures:  none Antibiotic  levaquin   06/27/2013 1st [place] [facility] office visit/ Marie Harper  Chief Complaint  Patient presents with  . Pulmonary Consult    Self referral- pt c/o cough and SOB since Nov 2014. Worse for the past month with recent hospitalization for PNA. Cough is prod with minimal clear sputum.   cough much worse when lie down with freq awakening > clear mucus was thick and green  , now clear saba helps wheeze, not really helping the  cough, not used since May 2 - not really sob unless coughing rec Pantoprazole (protonix) 40 mg   Take 30-60 min before first meal of the day and Pepcid 20 mg one bedtime    GERD diet   Dulera 100 Take 2 puffs first thing in am and then another 2 puffs about 12 hours later.  Prednisone 10 mg take  4 each am x 2 days,   2 each am x 2 days,  1 each am x 2 days and stop Use the cough medication   If not improving call Marie Harper for sinus CT scan before next visit.   07/11/2013 f/u ov/Marie Harper re: dulera 100 2bid uses saba once a week since prev ov Chief Complaint  Patient presents with  . Follow-up    prod cough with yellow-green mucous in morning,s white-clear mucous in afternoons, less frequent since last visit.   Not limited by breathing from desired activities   rec Please see patient coordinator before you leave today  to schedule Sinus CT > neg  Continue dulera 100 Take 2 puffs first thing in am and then another 2 puffs about 12 hours later.  Only use your albuterol (proair) as rescue    09/11/2013 f/u ov/Marie Harper re: variable nasal congestion/ daytime "drainage" never got h1  Chief Complaint  Patient presents with  . Follow-up    Pt states that her cough is some better since her last visit, but not resolved.  She c/o feeling tired all of the time.   Not limited by breathing from desired activities / no need for saba  rec For drainage take chlortrimeton (chlorpheniramine) 4 mg x 1 every 4 hours available over the counter (may cause drowsiness)  Increase Dulera 200 Take 2 puffs first thing in am and then another 2 puffs about 12 hours later  06/06/2014 acute ov/Marie Harper re: asthma flare  Chief Complaint  Patient presents with  . Follow-up    Pt states her breathing is doing well today. She was seen by PCP approx 1 wk ago with SOB and cough and was advised she had allergies. She has still had some cough- non prod.   from last ov until 2 weeks prior to OV  Did fine on dulera 200 tiw / no  rescue then acute onset sore throat/ some aches no fever p exp to sick daughter and started using rescue   Primary rec pred but did not take it  - did increase dulera to 200 2bid and still using saba more that twice daily but improved overall  Has gerd rx but not taking consistently and using cough drops  No abx / mucus was discolored but now clear  rec Dulera 200 Take 2 puffs first thing in am and then another 2 puffs about 12 hours later until 100% then ok to taper to see if flare Try protonix (pantoprazole) 40 mg  Take 30-60 min before first meal of the day and Pepcid 20 mg one bedtime until cough/wheezing/completely gone is completely gone for at least a week without the need for cough suppression GERD     Only use your albuterol as a rescue medication    12/03/2014  f/u ov/Marie Harper re: cough variant asthma breaking thru dulera 200 2bid / gerd rx  Chief Complaint  Patient presents with  . Acute Visit    Sore throat, PND, cough, and increased SOB.  Symptoms started 1 1/2 wks ago. No f/c/s.   Worse problem is cough at hs and keeps her up > pnds with green mucus     No obvious day to day or daytime variabilty or assoc  cp or chest tightness, subjective wheeze overt   hb symptoms. No unusual exp hx or h/o childhood pna/ asthma or knowledge of premature birth.  Sleeping ok without nocturnal  or early am exacerbation  of respiratory  c/o's or need for noct saba. Also denies any obvious fluctuation of symptoms with weather or environmental changes or other aggravating or alleviating factors except as outlined above   Current Medications, Allergies, Complete Past Medical History, Past Surgical History, Family History, and Social History were reviewed in [employer] record.  ROS  The following are not active complaints unless bolded sore throat, dysphagia, dental problems, itching, sneezing,  nasal congestion or excess/ purulent secretions, ear ache,   fever, chills, sweats,  unintended wt loss, pleuritic or exertional cp, hemoptysis,  orthopnea pnd or leg swelling, presyncope, palpitations, heartburn, abdominal pain, anorexia, nausea, vomiting, diarrhea  or change in bowel or urinary habits, change in stools or urine, dysuria,hematuria,  rash, arthralgias, visual complaints, headache, numbness weakness or ataxia or problems with walking or coordination,  change in mood/affect or memory.                 Objective:   Physical Exam  Pleasant amb bf nad   07/11/2013        172> 08/22/2013 176 > 09/11/2013  177 > 10/09/2013  173 > 01/07/2014 173 > 06/06/2014  174 > 12/03/2014   180  Wt Readings from Last 3 Encounters:  06/27/13 171 lb 12.8 oz (77.928 kg)  06/23/13 170 lb 13.7 oz (77.5 kg)  11/13/11 197 lb (89.359 kg)       HEENT: nl dentition, turbinates, and orophanx which is pristine.  Nl external ear canals without cough reflex   NECK :  without JVD/Nodes/TM/ nl carotid upstrokes bilaterally   LUNGS: no acc muscle use, clear to A and P bilaterally with clear bs bilaterally    CV:  RRR  no s3 or murmur or increase in P2, no edema   ABD:  soft and nontender with nl excursion in the supine position. No bruits or organomegaly, bowel sounds nl  MS:  warm without deformities, calf tenderness, cyanosis or clubbing       CTa chest 06/23/13  1. No evidence of pulmonary embolus.  2. The stomach and esophagus are fluid filled and distended.  Gastroesophageal reflux may be present.  3. Biapical nodular pleural parenchymal thickening, particularly on  the left. These changes are most likely secondary to scarring.          Assessment & Plan:

## 2014-12-04 NOTE — Telephone Encounter (Signed)
lmtcb

## 2014-12-05 NOTE — Telephone Encounter (Signed)
lmtcb X2 for pt.  

## 2014-12-06 NOTE — Telephone Encounter (Signed)
Called and spoke to pt. Pt stated she was returning a call from us stating we left a coupon card up front for her. I check and there was a coupon card waiting up front for her. Pt verbalized understanding and denied any further questions or concerns at this time.

## 2014-12-09 NOTE — Assessment & Plan Note (Addendum)
-   started on dulera 100 2bid 06/27/13> better 07/11/2013 - 07/11/2013 p extensive coaching HFA effectiveness =    90% - 07/24/13 Sinus CT > Slight mucosal thickening in the right maxillary sinus. Remainder of the paranasal sinuses are clear. No air-fluid levels. No acute bony abnormality. Orbital soft tissues are unremarkable. - spirometry  08/22/2013 min airflow obst reflected in fef 25-75 despite prominent "wheeze" on exam - 09/11/13 increased dulera to 200 2bid due to concern not using the dulera frequently enough -  12/03/2014  extensive coaching HFA effectiveness = 90%     Symptoms are disproportionate to objective findings and not clear this is a lung problem but pt does appear to have difficult airway management issues. DDX of  difficult airways management all start with A and  include Adherence, Ace Inhibitors, Acid Reflux, Active Sinus Disease, Alpha 1 Antitripsin deficiency, Anxiety masquerading as Airways dz,  ABPA,  allergy(esp in young), Aspiration (esp in elderly), Adverse effects of meds,  Active smokers, A bunch of PE's (a small clot burden can't cause this syndrome unless there is already severe underlying pulm or vascular dz with poor reserve) plus two Bs  = Bronchiectasis and Beta blocker use..and one C= CHF  Adherence is always the initial "prime suspect" and is a multilayered concern that requires a "trust but verify" approach in every patient - starting with knowing how to use medications, especially inhalers, correctly, keeping up with refills and understanding the fundamental difference between maintenance and prns vs those medications only taken for a very short course and then stopped and not refilled.  - The proper method of use, as well as anticipated side effects, of a metered-dose inhaler are discussed and demonstrated to the patient. Improved effectiveness after extensive coaching during this visit to a level of approximately  90% so try the lower dose of dulera   ? Acid (or  non-acid) GERD > always difficult to exclude as up to 75% of pts in some series report no assoc GI/ Heartburn symptoms> rec continue max (24h)  acid suppression and diet restrictions/ reviewed     ? Active sinus dz > Augmentin 875 mg take one pill twice daily  X 10 days -  Then sinus ct if not better(last study 07/24/13 chronic changes only)  ? Allergy > Prednisone 10 mg take  4 each am x 2 days,   2 each am x 2 days,  1 each am x 2 days and stop   I had an extended discussion with the patient reviewing all relevant studies completed to date and  lasting 15 to 20 minutes of a 25 minute visit    Each maintenance medication was reviewed in detail including most importantly the difference between maintenance and prns and under what circumstances the prns are to be triggered using an action plan format that is not reflected in the computer generated alphabetically organized AVS.    Please see instructions for details which were reviewed in writing and the patient given a copy highlighting the part that I personally wrote and discussed at today's ov.

## 2014-12-24 ENCOUNTER — Other Ambulatory Visit: Payer: Self-pay | Admitting: Internal Medicine

## 2014-12-24 DIAGNOSIS — J45991 Cough variant asthma: Secondary | ICD-10-CM

## 2014-12-24 MED ORDER — MOMETASONE FURO-FORMOTEROL FUM 100-5 MCG/ACT IN AERO: INHALATION_SPRAY | RESPIRATORY_TRACT | Status: DC

## 2015-01-14 ENCOUNTER — Ambulatory Visit: Payer: BLUE CROSS/BLUE SHIELD | Admitting: Internal Medicine

## 2015-01-20 ENCOUNTER — Emergency Department (HOSPITAL_COMMUNITY)
Admission: EM | Admit: 2015-01-20 | Discharge: 2015-01-20 | Disposition: A | Discharge status: Home / Self Care | Payer: BLUE CROSS/BLUE SHIELD | Source: Home / Self Care

## 2015-01-20 DIAGNOSIS — H5789 Other specified disorders of eye and adnexa: Secondary | ICD-10-CM

## 2015-01-20 DIAGNOSIS — H578 Other specified disorders of eye and adnexa: Secondary | ICD-10-CM | POA: Diagnosis not present

## 2015-01-20 MED ORDER — EYE WASH OPHTH SOLN
OPHTHALMIC | Status: AC
  Filled 2015-01-20: qty 236

## 2015-01-20 NOTE — Discharge Instructions (Signed)
Wear glasses  Use artificial tears  Follow up with your PCP if there are new or worsening of symptoms

## 2015-01-20 NOTE — ED Notes (Addendum)
Pt here for irritation to left eye after spraying deodorant  in it on accident Also comes in with sinus pressure with left ear pain  Tried eye wash and cough drops

## 2015-01-20 NOTE — ED Provider Notes (Signed)
CSN: [id number]     Arrival date & time 01/20/15  1756 History   None    Chief Complaint  Patient presents with  . Eye Problem  . Sinusitis   (Consider location/radiation/quality/duration/timing/severity/associated sxs/prior Treatment) HPI History obtained from patient:   LOCATION: left eye SEVERITY:3 DURATION:less than 1 hour CONTEXT:sprayed deordarant  QUALITY:stinging MODIFYING FACTORS:flushed at home ASSOCIATED SYMPTOMS: blurry vision TIMING:constant    Past Medical History  Diagnosis Date  . Anemia   . Headache(784.0)     has not had migraine in "long time"   Past Surgical History  Procedure Laterality Date  . Bunionectomy    . Foot surgery      left   Family History  Problem Relation Age of Onset  . Arthritis Other   . Heart disease Maternal Grandmother   . Kidney disease Other   . Diabetes Other   . Diabetes Mother   . Cancer Maternal Aunt     nasal/sinus  . Anesthesia problems Neg Hx   . Asthma Paternal Aunt   . Emphysema Paternal Uncle     smoked   Social History  Substance Use Topics  . Smoking status: Never Smoker   . Smokeless tobacco: Never Used  . Alcohol Use: 3.6 oz/week    2 Glasses of wine, 2 Cans of beer, 2 Shots of liquor per week     Comment: social    OB History    Gravida Para Term Preterm AB TAB SAB Ectopic Multiple Living   1 1 1  0 0 0 0 0 0 1     Review of Systems ROS +'ve left eye irritation  Denies: HEADACHE, NAUSEA, ABDOMINAL PAIN, CHEST PAIN, CONGESTION, DYSURIA, SHORTNESS OF BREATH  Allergies  Review of patient's allergies indicates no known allergies.  Home Medications   Prior to Admission medications   Medication Sig Start Date End Date Taking? Authorizing Provider  albuterol (PROVENTIL HFA;VENTOLIN HFA) 108 (90 BASE) MCG/ACT inhaler Inhale 1-2 puffs into the lungs every 6 (six) hours as needed for wheezing. 10/23/12   Reuben Likesavid C Keller, MD  amoxicillin-clavulanate (AUGMENTIN) 875-125 MG tablet Take 1 tablet by  mouth 2 (two) times daily. 12/03/14   Nyoka CowdenMichael B Wert, MD  cetirizine (ZYRTEC) 10 MG tablet Take 10 mg by mouth daily as needed for allergies.    Historical Provider, MD  famotidine (PEPCID) 20 MG tablet One at bedtime 06/06/14   Nyoka CowdenMichael B Wert, MD  HYDROcodone-acetaminophen (NORCO/VICODIN) 5-325 MG per tablet Take 1 tablet by mouth as needed. 10/01/13   Historical Provider, MD  ibuprofen (ADVIL,MOTRIN) 600 MG tablet Take 600 mg by mouth every 6 (six) hours as needed for moderate pain.    Historical Provider, MD  meloxicam (MOBIC) 15 MG tablet Take 15 mg by mouth daily.    Historical Provider, MD  Misc Natural Products (SUPER GREENS) POWD Take by mouth daily.    Historical Provider, MD  mometasone-formoterol (DULERA) 100-5 MCG/ACT AERO Take 2 puffs first thing in am and then another 2 puffs about 12 hours later. 12/24/14   Nyoka CowdenMichael B Wert, MD  Multiple Vitamins-Minerals (ALIVE WOMENS ENERGY PO) Take 1 tablet by mouth daily.    Historical Provider, MD  norethindrone-ethinyl estradiol (JUNEL FE,GILDESS FE,LOESTRIN FE) 1-20 MG-MCG tablet Take 1 tablet by mouth daily.    Historical Provider, MD  pantoprazole (PROTONIX) 40 MG tablet Take 1 tablet (40 mg total) by mouth daily. Take 30-60 min before first meal of the day 06/06/14   Nyoka CowdenMichael B Wert, MD  predniSONE (DELTASONE) 10 MG tablet Take  4 each am x 2 days,   2 each am x 2 days,  1 each am x 2 days and stop 12/03/14   Nyoka Cowden, MD   Meds Ordered and Administered this Visit  Medications - No data to display  There were no vitals taken for this visit. No data found.   Physical Exam  Constitutional: She appears well-developed and well-nourished. No distress.  HENT:  Head: Normocephalic and atraumatic.  Eyes: EOM are normal. Pupils are equal, round, and reactive to light. Left eye exhibits no discharge. Left conjunctiva is injected. Left conjunctiva has no hemorrhage.    ED Course  Procedures (including critical care time) Nursing staff with  copious flushing and vision check 20/20 OU with pin hole Labs Review Labs Reviewed - No data to display  Imaging Review No results found.   Visual Acuity Review  Right Eye Distance:   Left Eye Distance:   Bilateral Distance:    Right Eye Near:   Left Eye Near:    Bilateral Near:         MDM   1. Irritation of eye    Symptomatic treatment at home Follow up with eye physician as needed.     Tharon Aquas, [place] 01/21/15 [phone]

## 2015-02-10 ENCOUNTER — Ambulatory Visit: Payer: BLUE CROSS/BLUE SHIELD | Admitting: Internal Medicine

## 2015-08-28 ENCOUNTER — Other Ambulatory Visit: Payer: Self-pay | Admitting: Internal Medicine

## 2015-10-29 ENCOUNTER — Other Ambulatory Visit: Payer: Self-pay | Admitting: Internal Medicine

## 2016-01-19 ENCOUNTER — Other Ambulatory Visit: Payer: Self-pay | Admitting: Family Medicine

## 2016-01-19 DIAGNOSIS — N63 Unspecified lump in unspecified breast: Secondary | ICD-10-CM

## 2016-01-20 ENCOUNTER — Other Ambulatory Visit: Payer: Self-pay | Admitting: Family Medicine

## 2016-01-20 DIAGNOSIS — N63 Unspecified lump in unspecified breast: Secondary | ICD-10-CM

## 2016-01-30 ENCOUNTER — Ambulatory Visit: Payer: BLUE CROSS/BLUE SHIELD | Admitting: Nurse Practitioner

## 2016-05-21 IMAGING — CT CT PARANASAL SINUSES LIMITED
1 series · 13 of 15 positions shown, 17 images · non-contrast
Comparison: Plain films 08/28/2012

CLINICAL DATA: Persistent productive cough.

EXAM:
CT PARANASAL SINUS WITHOUT CONTRAST
TECHNIQUE: Multidetector CT images of the paranasal sinuses were obtained using
the standard protocol without intravenous contrast.

[Series 4: ltd sinus 3.0 h30s · axial · 0.33mm/px · z∈[-53,+23]mm · 13 of 15 slices shown, 17 images]
[im 2/15  brain]
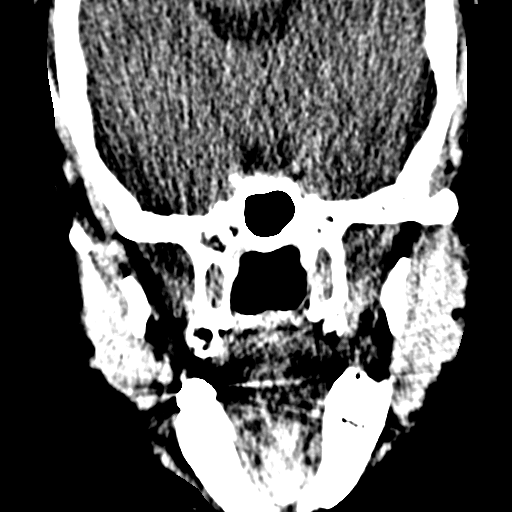
[im 2/15  bone]
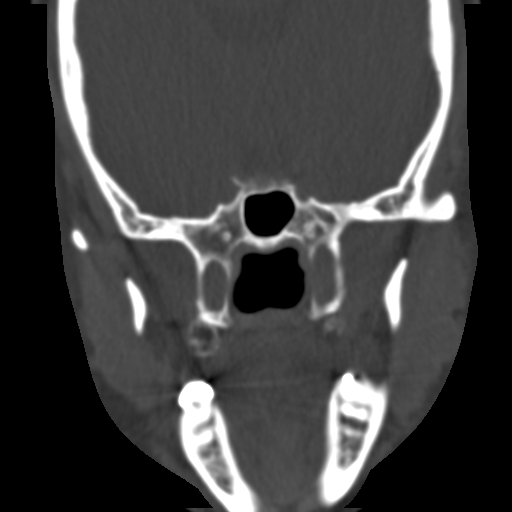
[im 3/15  bone]
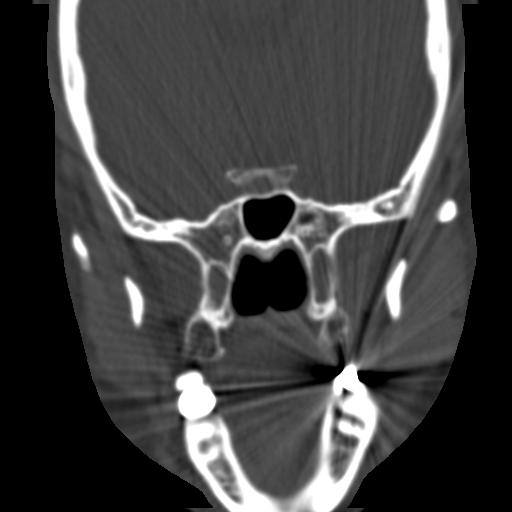
[im 4/15  bone]
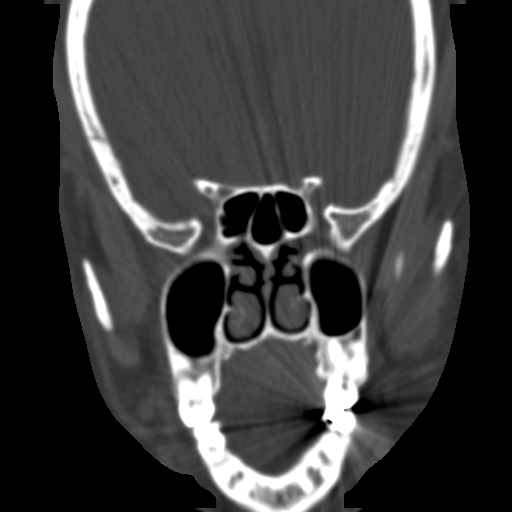
[im 5/15  bone]
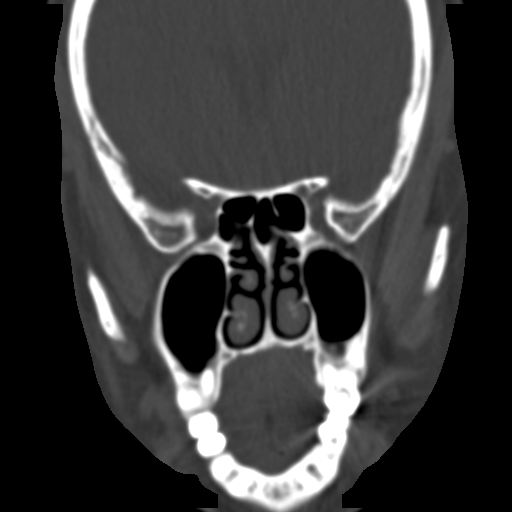
[im 6/15  brain]
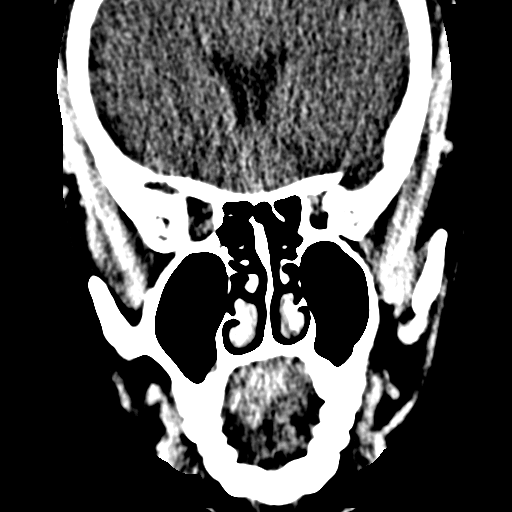
[im 6/15  bone]
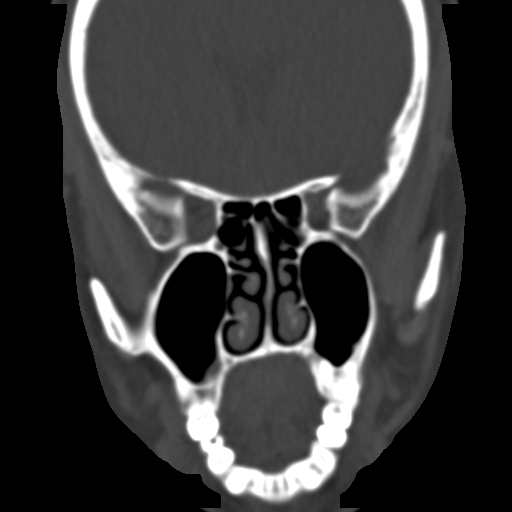
[im 7/15  bone]
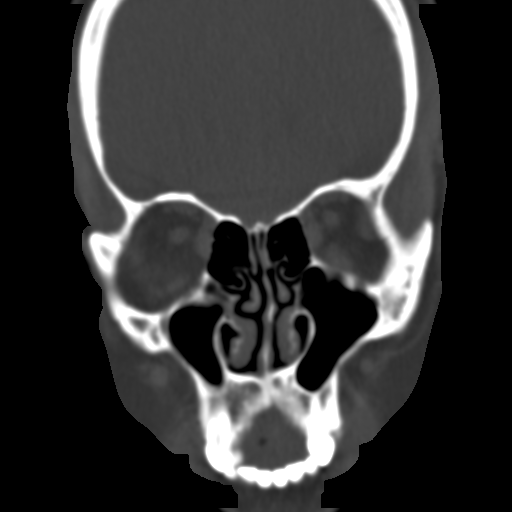
[im 8/15  bone]
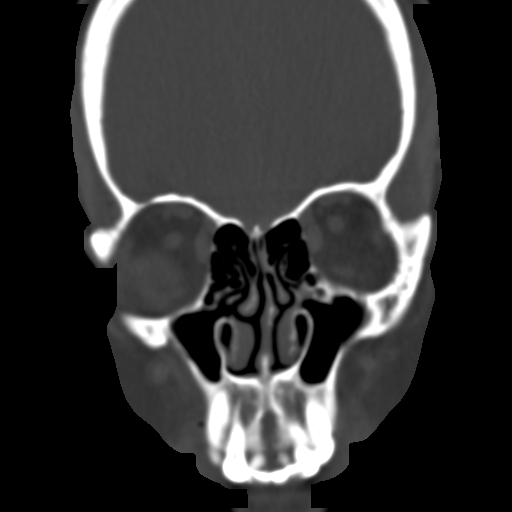
[im 9/15  bone]
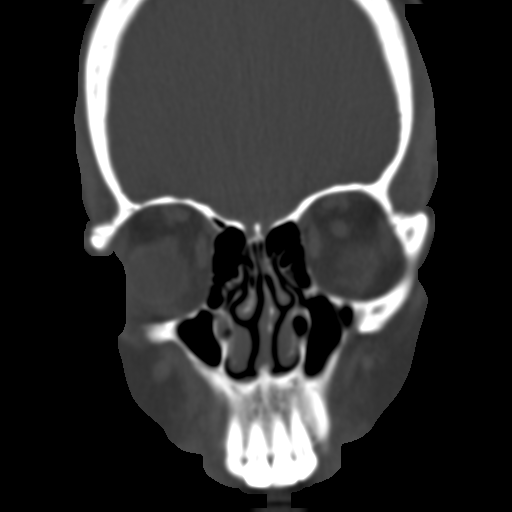
[im 10/15  brain]
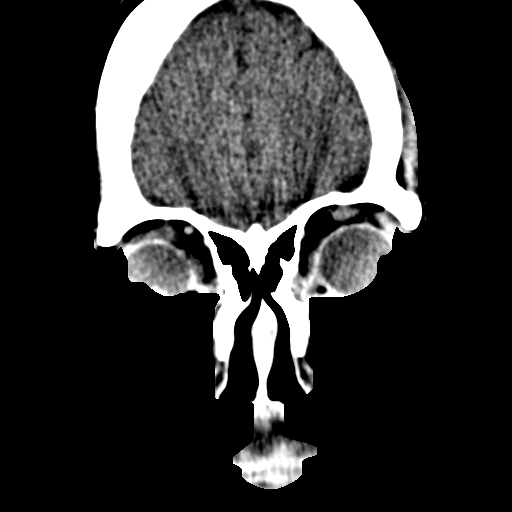
[im 10/15  bone]
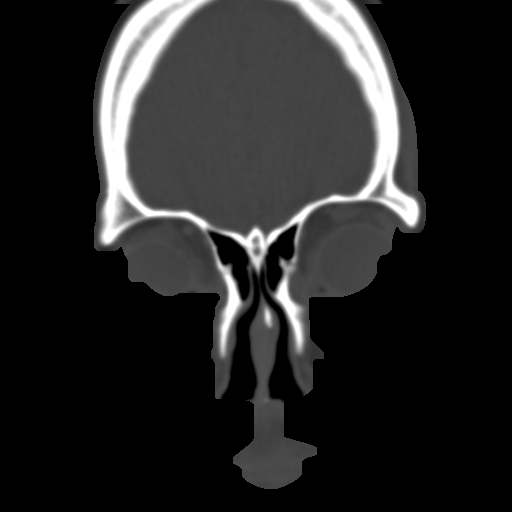
[im 11/15  bone]
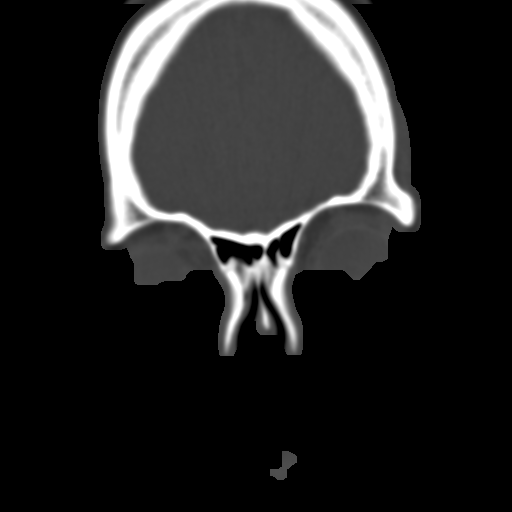
[im 12/15  bone]
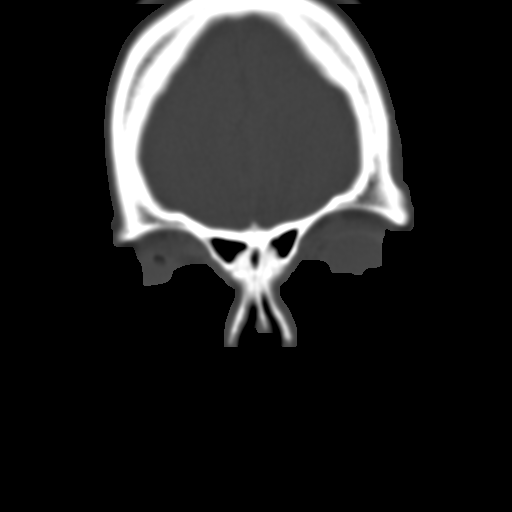
[im 13/15  bone]
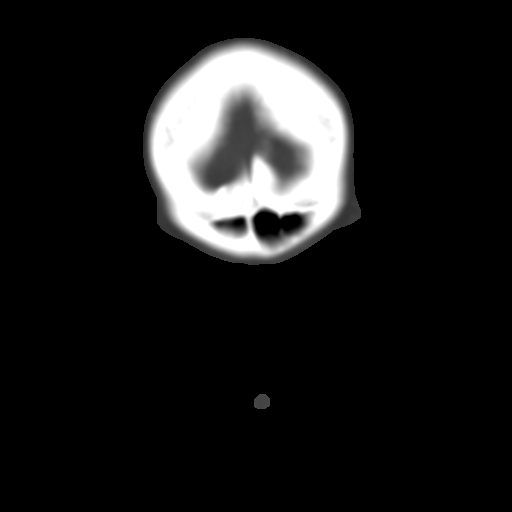
[im 14/15  brain]
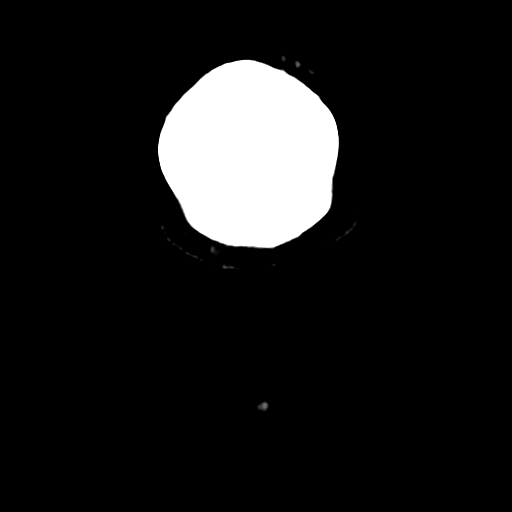
[im 14/15  bone]
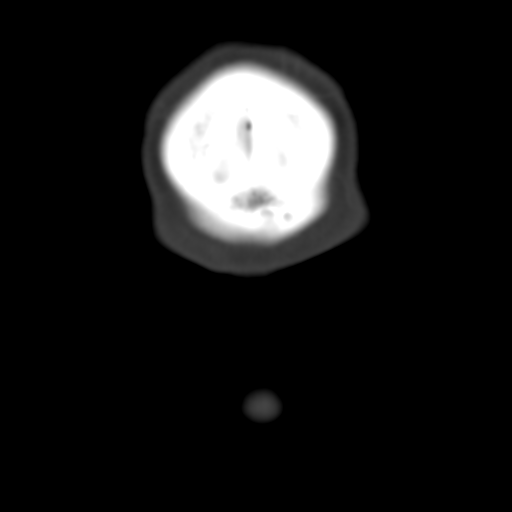

[13 of 15 positions shown; findings below may reference images not displayed]

FINDINGS: Slight mucosal thickening in the right maxillary sinus. Remainder of
the paranasal sinuses are clear. No air-fluid levels. No acute bony
abnormality. Orbital soft tissues are unremarkable.
IMPRESSION: Slight chronic sinusitis changes in the right maxillary sinus.

## 2016-08-30 DIAGNOSIS — E669 Obesity, unspecified: Secondary | ICD-10-CM | POA: Insufficient documentation

## 2016-09-08 ENCOUNTER — Emergency Department (HOSPITAL_COMMUNITY)
Admission: EM | Admit: 2016-09-08 | Discharge: 2016-09-08 | Disposition: A | Discharge status: Home / Self Care | Payer: BLUE CROSS/BLUE SHIELD | Attending: Emergency Medicine | Admitting: Emergency Medicine

## 2016-09-08 ENCOUNTER — Encounter (HOSPITAL_COMMUNITY): Payer: Self-pay | Admitting: Emergency Medicine

## 2016-09-08 DIAGNOSIS — Z79899 Other long term (current) drug therapy: Secondary | ICD-10-CM | POA: Diagnosis not present

## 2016-09-08 DIAGNOSIS — R252 Cramp and spasm: Secondary | ICD-10-CM | POA: Insufficient documentation

## 2016-09-08 LAB — CBC WITH DIFFERENTIAL/PLATELET
BASOS PCT: 0 %
Basophils Absolute: 0 10*3/uL (ref 0.0–0.1)
Eosinophils Absolute: 0 10*3/uL (ref 0.0–0.7)
Eosinophils Relative: 0 %
HEMATOCRIT: 39.4 % (ref 36.0–46.0)
Hemoglobin: 13.3 g/dL (ref 12.0–15.0)
Lymphocytes Relative: 22 %
Lymphs Abs: 2.9 10*3/uL (ref 0.7–4.0)
MCH: 27.3 pg (ref 26.0–34.0)
MCHC: 33.8 g/dL (ref 30.0–36.0)
MCV: 80.9 fL (ref 78.0–100.0)
MONO ABS: 0.5 10*3/uL (ref 0.1–1.0)
Monocytes Relative: 3 %
NEUTROS ABS: 9.9 10*3/uL — AB (ref 1.7–7.7)
Neutrophils Relative %: 75 %
Platelets: 267 10*3/uL (ref 150–400)
RBC: 4.87 MIL/uL (ref 3.87–5.11)
RDW: 15.2 % (ref 11.5–15.5)
WBC: 13.3 10*3/uL — ABNORMAL HIGH (ref 4.0–10.5)

## 2016-09-08 LAB — COMPREHENSIVE METABOLIC PANEL
ALBUMIN: 3.9 g/dL (ref 3.5–5.0)
ALT: 18 U/L (ref 14–54)
ANION GAP: 8 (ref 5–15)
AST: 21 U/L (ref 15–41)
Alkaline Phosphatase: 30 U/L — ABNORMAL LOW (ref 38–126)
BILIRUBIN TOTAL: 0.7 mg/dL (ref 0.3–1.2)
BUN: 16 mg/dL (ref 6–20)
CO2: 21 mmol/L — ABNORMAL LOW (ref 22–32)
Calcium: 9.1 mg/dL (ref 8.9–10.3)
Chloride: 106 mmol/L (ref 101–111)
Creatinine, Ser: 0.9 mg/dL (ref 0.44–1.00)
GFR calc Af Amer: >60 mL/min (ref >60)
Glucose, Bld: 125 mg/dL — ABNORMAL HIGH (ref 65–99)
POTASSIUM: 4.5 mmol/L (ref 3.5–5.1)
Sodium: 135 mmol/L (ref 135–145)
TOTAL PROTEIN: 6.6 g/dL (ref 6.5–8.1)

## 2016-09-08 MED ORDER — IBUPROFEN 600 MG PO TABS: 600.0000 mg | ORAL_TABLET | Freq: Four times a day (QID) | ORAL | 0 refills | Status: DC | PRN

## 2016-09-08 MED ORDER — METHOCARBAMOL 500 MG PO TABS: 500.0000 mg | ORAL_TABLET | Freq: Two times a day (BID) | ORAL | 0 refills | Status: DC

## 2016-09-08 NOTE — ED Provider Notes (Signed)
[facility]-EMERGENCY DEPT Provider Note   CSN: [id number] Arrival date & time: 09/08/16  0305     History   Chief Complaint Chief Complaint  Patient presents with  . Muscle Pain    HPI Marie Harper is a 41 y.o. female.  HPI   Patient is a 41 year old female with no pertinent past medical history presents the ED with complaint of muscle cramps. Patient reports over the past 2 days she has had intermittent cramping to her back, arms and legs. She notes symptoms initially occurred while she was at work. 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 She currently denies any muscle cramps but states she has continued to have aching to her muscles. Patient denies taking any medications at home for her symptoms. Denies any recent fall, trauma or injury. Denies fever, chills, body ache, chest pain, shortness of breath, abdominal pain, vomiting, urinary symptoms, numbness, weakness, rash, swelling. Patient notes she has been taking prednisone for the past 11 days due to an injury to her left hand which she is followed by ortho regarding, reports having one more dose left. Denies any other new medications, drug or alcohol use.  Past Medical History:  Diagnosis Date  . Anemia   . Headache(784.0)    has not had migraine in "long time"    Patient Active Problem List   Diagnosis Date Noted  . Cervical neck pain with evidence of disc disease 01/07/2014  . Upper airway cough syndrome 08/22/2013  . Cough variant asthma 06/28/2013  . Abnormal CT scan, chest 06/28/2013  . Anemia 02/18/2011  . Fatigue 02/18/2011  . Snoring 02/18/2011    Past Surgical History:  Procedure Laterality Date  . BUNIONECTOMY    . FOOT SURGERY     left    OB History    Gravida Para Term Preterm AB Living   1 1 1  0 0 1   SAB TAB Ectopic Multiple Live Births   0 0 0 0 1       Home Medications    Prior to  Admission medications   Medication Sig Start Date End Date Taking? Authorizing Provider  albuterol (PROVENTIL HFA;VENTOLIN HFA) 108 (90 BASE) MCG/ACT inhaler Inhale 1-2 puffs into the lungs every 6 (six) hours as needed for wheezing. 10/23/12   Reuben LikesKeller, David C, MD  amoxicillin-clavulanate (AUGMENTIN) 875-125 MG tablet Take 1 tablet by mouth 2 (two) times daily. 12/03/14   Nyoka CowdenWert, Michael B, MD  cetirizine (ZYRTEC) 10 MG tablet Take 10 mg by mouth daily as needed for allergies.    [provider]  famotidine (PEPCID) 20 MG tablet One at bedtime 06/06/14   Nyoka CowdenWert, Michael B, MD  HYDROcodone-acetaminophen (NORCO/VICODIN) 5-325 MG per tablet Take 1 tablet by mouth as needed. 10/01/13   [provider]  ibuprofen (ADVIL,MOTRIN) 600 MG tablet Take 1 tablet (600 mg total) by mouth every 6 (six) hours as needed. 09/08/16   Barrett HenleNadeau, Nicole Elizabeth, PA-C  meloxicam (MOBIC) 15 MG tablet Take 15 mg by mouth daily.    [provider]  methocarbamol (ROBAXIN) 500 MG tablet Take 1 tablet (500 mg total) by mouth 2 (two) times daily. 09/08/16   Barrett HenleNadeau, Nicole Elizabeth, PA-C  Misc Natural Products (SUPER GREENS) POWD Take by mouth daily.    [provider]  mometasone-formoterol (DULERA) 100-5 MCG/ACT AERO Take 2 puffs first thing in am and then another 2 puffs about 12 hours  later. 12/24/14   Nyoka Cowden, MD  Multiple Vitamins-Minerals (ALIVE WOMENS ENERGY PO) Take 1 tablet by mouth daily.    [provider]  norethindrone-ethinyl estradiol (JUNEL FE,GILDESS FE,LOESTRIN FE) 1-20 MG-MCG tablet Take 1 tablet by mouth daily.    [provider]  pantoprazole (PROTONIX) 40 MG tablet Take 1 tablet (40 mg total) by mouth daily. Take 30-60 min before first meal of the day 06/06/14   Nyoka Cowden, MD  predniSONE (DELTASONE) 10 MG tablet Take  4 each am x 2 days,   2 each am x 2 days,  1 each am x 2 days and stop 12/03/14   Nyoka Cowden, MD    Family History Family  History  Problem Relation Age of Onset  . Arthritis Other   . Kidney disease Other   . Diabetes Other   . Heart disease Maternal Grandmother   . Diabetes Mother   . Cancer Maternal Aunt        nasal/sinus  . Asthma Paternal Aunt   . Emphysema Paternal Uncle        smoked  . Anesthesia problems Neg Hx     Social History Social History  Substance Use Topics  . Smoking status: Never Smoker  . Smokeless tobacco: Never Used  . Alcohol use 3.6 oz/week    2 Glasses of wine, 2 Cans of beer, 2 Shots of liquor per week     Comment: social      Allergies   Patient has no known allergies.   Review of Systems Review of Systems  Musculoskeletal: Positive for myalgias (muscle cramps).  All other systems reviewed and are negative.    Physical Exam Updated Vital Signs BP 117/69   Pulse 66   Temp 98.1 F (36.7 C) (Oral)   Resp 16   Ht 5\' 5"  (1.651 m)   Wt 83.9 kg (185 lb)   LMP 08/22/2016   SpO2 100%   BMI 30.79 kg/m   Physical Exam  Constitutional: She is oriented to person, place, and time. She appears well-developed and well-nourished. No distress.  HENT:  Head: Normocephalic and atraumatic.  Mouth/Throat: Oropharynx is clear and moist. No oropharyngeal exudate.  Eyes: Conjunctivae and EOM are normal. Right eye exhibits no discharge. Left eye exhibits no discharge. No scleral icterus.  Neck: Normal range of motion. Neck supple.  Cardiovascular: Normal rate, regular rhythm, normal heart sounds and intact distal pulses.   Pulmonary/Chest: Effort normal and breath sounds normal. No respiratory distress. She has no wheezes. She has no rales. She exhibits no tenderness.  Abdominal: Soft. Bowel sounds are normal. She exhibits no distension and no mass. There is no tenderness. There is no rebound and no guarding.  Musculoskeletal: Normal range of motion. She exhibits no edema, tenderness or deformity.  No midline C, T, or L tenderness. Full range of motion of neck and back.  Full range of motion of bilateral upper and lower extremities, with 5/5 strength. Sensation intact. 2+ radial and PT pulses. Cap refill <2 seconds. Patient able to stand and ambulate without assistance.    Neurological: She is alert and oriented to person, place, and time. She has normal strength. No sensory deficit.  Skin: Skin is warm and dry. She is not diaphoretic.  Nursing note and vitals reviewed.    ED Treatments / Results  Labs (all labs ordered are listed, but only abnormal results are displayed) Labs Reviewed  CBC WITH DIFFERENTIAL/PLATELET - Abnormal; Notable for the following:  Result Value   WBC 13.3 (*)    Neutro Abs 9.9 (*)    All other components within normal limits  COMPREHENSIVE METABOLIC PANEL - Abnormal; Notable for the following:    CO2 21 (*)    Glucose, Bld 125 (*)    Alkaline Phosphatase 30 (*)    All other components within normal limits    EKG  EKG Interpretation None       Radiology No results found.  Procedures Procedures (including critical care time)  Medications Ordered in ED Medications - No data to display   Initial Impression / Assessment and Plan / ED Course  I have reviewed the triage vital signs and the nursing notes.  Pertinent labs & imaging results that were available during my care of the patient were reviewed by me and considered in my medical decision making (see chart for details).     Pt presents with intermittent muscle cramping present to her back, arms and legs that has been present for the past 2 days. She currently denies any cramping but reports associated muscle aches. Denies any recent fall, trauma or injury. 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 Denies any other associated symptoms. VSS. Exam unremarkable. No neuro deficits. No midline spinal tenderness. Bilateral upper and lower, nasogastric vascular intact. Patient  declined pain meds in the ED. Labs revealed WBC 13, patient notes she has been on prednisone for the past 11 days due to an injury to her left hand which is being followed by orthopedics. Remaining labs unremarkable. Suspect patient's symptoms may be due to mild dehydration associated with her job which she reports starting a few months ago. Do not suspect neurological etiology warranting further work up at this time. No signs of infection. Plan to discharge patient home with symptomatic treatment and 50 follow-up. Discussed return precautions.  Final Clinical Impressions(s) / ED Diagnoses   Final diagnoses:  Muscle cramps    New Prescriptions New Prescriptions   IBUPROFEN (ADVIL,MOTRIN) 600 MG TABLET    Take 1 tablet (600 mg total) by mouth every 6 (six) hours as needed.   METHOCARBAMOL (ROBAXIN) 500 MG TABLET    Take 1 tablet (500 mg total) by mouth 2 (two) times daily.     Barrett Henle, PA-C 09/08/16 0710    Gilda Crease, MD 09/08/16 [phone]

## 2016-09-08 NOTE — ED Triage Notes (Signed)
Pt c/o "full body" cramping that began Monday while at work. States cramps come and go, tonight she was unable to sleep.

## 2016-09-08 NOTE — Discharge Instructions (Signed)
Take your medication as prescribed as needed for pain relief and muscle spasms. You may also apply ice to affected area for muscle aches and heat for muscle spasms. Continue drinking water at home to remain hydrated. Please follow up with a primary care provider from the Resource Guide provided below in 1 week as needed. Please return to the Emergency Department if symptoms worsen or new onset of fever, numbness, weakness, swelling, rash.

## 2016-09-08 NOTE — ED Triage Notes (Signed)
Pt also states that she has been taking prednisone for about a week for an issue with her hand.

## 2016-10-13 ENCOUNTER — Telehealth: Payer: Self-pay | Admitting: Internal Medicine

## 2016-10-13 DIAGNOSIS — J45991 Cough variant asthma: Secondary | ICD-10-CM

## 2016-10-13 MED ORDER — MOMETASONE FURO-FORMOTEROL FUM 100-5 MCG/ACT IN AERO
INHALATION_SPRAY | RESPIRATORY_TRACT | 0 refills | Status: DC
Start: 2016-10-13 — End: 2016-11-22

## 2016-10-13 MED ORDER — FAMOTIDINE 20 MG PO TABS: ORAL_TABLET | ORAL | 0 refills | Status: DC

## 2016-10-13 NOTE — Telephone Encounter (Signed)
Spoke with patient. She is aware that we will only be able to give her enough medication to last until her appt with MW in October. She verbalized understanding. Will go ahead and send these into her pharmacy for her. Nothing else was needed at time of call.

## 2016-10-13 NOTE — Telephone Encounter (Signed)
LMOM TCB x1 We'll be happy to send in Rx, but only enough to last until her appt - last refills were for 90 days so would like to speak with patient prior to sending rx

## 2016-10-18 ENCOUNTER — Ambulatory Visit: Payer: BLUE CROSS/BLUE SHIELD | Admitting: Family Medicine

## 2016-11-22 ENCOUNTER — Ambulatory Visit: Payer: BLUE CROSS/BLUE SHIELD | Admitting: Internal Medicine

## 2016-11-22 ENCOUNTER — Telehealth: Payer: Self-pay | Admitting: Internal Medicine

## 2016-11-22 DIAGNOSIS — J45991 Cough variant asthma: Secondary | ICD-10-CM

## 2016-11-22 MED ORDER — PANTOPRAZOLE SODIUM 40 MG PO TBEC: 40.0000 mg | DELAYED_RELEASE_TABLET | Freq: Every day | ORAL | 0 refills | Status: DC

## 2016-11-22 MED ORDER — MOMETASONE FURO-FORMOTEROL FUM 100-5 MCG/ACT IN AERO: INHALATION_SPRAY | RESPIRATORY_TRACT | 0 refills | Status: DC

## 2016-11-22 NOTE — Telephone Encounter (Signed)
Pt has pending OV with MW on 12/02/16. Rxs have been refilled. Pt is aware. Nothing further was needed.

## 2016-11-22 NOTE — Telephone Encounter (Signed)
lmtcb x1 for pt. She will need an OV for refills, we have not seen her since 2016.

## 2016-12-02 ENCOUNTER — Ambulatory Visit (INDEPENDENT_AMBULATORY_CARE_PROVIDER_SITE_OTHER): Payer: BLUE CROSS/BLUE SHIELD | Admitting: Internal Medicine

## 2016-12-02 ENCOUNTER — Encounter: Payer: Self-pay | Admitting: Internal Medicine

## 2016-12-02 VITALS — BP 120/74 | HR 85 | Ht 65.5 in | Wt 180.0 lb

## 2016-12-02 DIAGNOSIS — J45991 Cough variant asthma: Secondary | ICD-10-CM | POA: Diagnosis not present

## 2016-12-02 MED ORDER — BUDESONIDE-FORMOTEROL FUMARATE 80-4.5 MCG/ACT IN AERO: 2.0000 | INHALATION_SPRAY | Freq: Two times a day (BID) | RESPIRATORY_TRACT | 0 refills | Status: DC

## 2016-12-02 MED ORDER — BUDESONIDE-FORMOTEROL FUMARATE 80-4.5 MCG/ACT IN AERO: 2.0000 | INHALATION_SPRAY | Freq: Two times a day (BID) | RESPIRATORY_TRACT | 11 refills | Status: DC

## 2016-12-02 NOTE — Progress Notes (Signed)
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 on [address]  rx abx then worse again in November 2014 progressively worse  Despite rx and referred to Pulmonary clinic 06/28/13 by Cammy Fulp p admit   Admit date: 06/23/2013  Discharge date: 06/25/2013  Discharge Diagnoses:  CAP (community acquired pneumonia)  Anemia  Bronchospasm      Diet recommendation: low sodium heart healthy  Filed Weights    06/23/13 1252   Weight:  77.5 kg (170 lb 13.7 oz)   History of present illness:  Marie Harper is a 41 y.o. female presented with a non resolving cough and SOB for several month, recently with fever, chills, night sweats; she was empirically treated outpatient with keflex, prednisone several times. ED revealed pneumonia on x ray, WBC of 15.5.  Hospital Course:  CAP  CXR lower lobe infiltrate,  CT showed biapical scarring. Needs a repeat CT in 6 months  Levaquin 750 IV while inpatient, will finish 5 day course outpatient.  Steroids for wheeze, tussionex and tessalon pearls for cough. Steroids discontinued on discharge.  Respiratory viral panel pending  Anemia  Appears chronic, Asymptomatic. Follow up outpatient. Procedures:  none Antibiotic  levaquin   06/27/2013 1st [facility] office visit/ Ellene Bloodsaw  Chief Complaint  Patient presents with  . Pulmonary Consult    Self referral- pt c/o cough and SOB since Nov 2014. Worse for the past month with recent hospitalization for PNA. Cough is prod with minimal clear sputum.   cough much worse when lie down with freq awakening > clear mucus was thick and green  , now clear saba helps wheeze, not really helping the  cough, not used since May 2 - not really sob unless coughing rec Pantoprazole (protonix) 40 mg   Take 30-60 min before first meal of the day and Pepcid 20 mg one bedtime    GERD diet   Dulera 100 Take 2 puffs first thing in am and then another 2 puffs about 12 hours later.  Prednisone 10 mg take  4 each am x 2 days,   2 each am x 2 days,  1 each am x 2 days and stop Use the cough medication   If not improving call [facility] [phone] for sinus CT scan before next visit.   07/11/2013 f/u ov/Herve Haug re: dulera 100 2bid uses saba once a week since prev ov Chief Complaint  Patient presents with  . Follow-up    prod cough with yellow-green mucous in morning,s white-clear mucous in afternoons, less frequent since last visit.   Not limited by breathing from desired activities   rec Please see patient coordinator before you leave today  to schedule Sinus CT > neg  Continue dulera 100 Take 2 puffs first thing in am and then another 2 puffs about 12 hours later.  Only use your albuterol (proair) as rescue          12/03/2014  f/u ov/Johnny Latu re: cough variant asthma breaking thru dulera 200 2bid / gerd rx  Chief Complaint  Patient presents with  . Acute Visit    Sore throat, PND, cough,  and increased SOB.  Symptoms started 1 1/2 wks ago. No f/c/s.   Worse problem is cough at hs and keeps her up > pnds with green mucus  rec Change dulera to 100 Take 2 puffs first thing in am and then another 2 puffs about 12 hours later.  For drainage / throat tickle try take CHLORPHENIRAMINE  4 mg - take one every 4 hours as needed - available over the counter- may cause drowsiness so start with just a bedtime dose or two and see how you tolerate it before trying in daytime  Augmentin 875 mg take one pill twice daily  X 10 days - take at breakfast and supper with large glass of water.  It would help reduce the usual side effects (diarrhea and yeast infections) if you ate cultured yogurt at lunch. If not better then take  Prednisone 10 mg take  4 each am x 2 days,   2 each am x 2 days,  1 each am x 2 days and stop       12/02/2016  f/u ov/Rameses Ou re:  Cough variant asthma  Chief Complaint  Patient presents with  . Follow-up    Pt needing medication refills.    off dulera  X  2 months and more sob/ non prod cough but only used rescue rarely  Most of the symptoms are daytime  / not noct  Sob is sitting and with activity and was able to zumba at one point but now too sob   And more difficult to get up steps since stopped the dulera   No obvious day to day or daytime variability or assoc excess/ purulent sputum or mucus plugs or hemoptysis or cp or chest tightness, subjective wheeze or overt sinus or hb symptoms. No unusual exp hx or h/o childhood pna/ asthma or knowledge of premature birth.  Sleeping ok flat without nocturnal  or early am exacerbation  of respiratory  c/o's or need for noct saba. Also denies any obvious fluctuation of symptoms with weather or environmental changes or other aggravating or alleviating factors except as outlined above   Current Allergies, Complete Past Medical History, Past Surgical History, Family History, and Social History were reviewed in [employer] record.  ROS  The following are not active complaints unless bolded Hoarseness, sore throat, dysphagia, dental problems, itching, sneezing,  nasal congestion or discharge of excess mucus or purulent secretions, ear ache,   fever, chills, sweats, unintended wt loss or wt gain, classically pleuritic or exertional cp,  orthopnea pnd or leg swelling, presyncope, palpitations, abdominal pain, anorexia, nausea, vomiting, diarrhea  or change in bowel habits or change in bladder habits, change in stools or change in urine, dysuria, hematuria,  rash, arthralgias, visual complaints, headache, numbness, weakness or ataxia or problems with walking or coordination,  change in mood/affect or memory.        Current Meds   Medication Sig  . cetirizine (ZYRTEC) 10 MG tablet Take 10 mg by mouth daily as needed for allergies.  Marland Kitchen diclofenac (VOLTAREN) 50 MG EC tablet Take 50 mg by mouth 2 (two) times daily.  Marland Kitchen HYDROcodone-acetaminophen (NORCO/VICODIN) 5-325 MG per tablet Take 1 tablet by mouth as needed.  Marland Kitchen ibuprofen (ADVIL,MOTRIN) 600 MG tablet Take 1 tablet (600 mg total) by mouth every 6 (six) hours as needed.  . meloxicam (MOBIC) 15 MG tablet Take 15 mg by mouth daily.  . methocarbamol (ROBAXIN) 500 MG tablet Take 1 tablet (500 mg total) by  mouth 2 (two) times daily.  . Misc Natural Products (SUPER GREENS) POWD Take by mouth daily.  . Multiple Vitamins-Minerals (ALIVE WOMENS ENERGY PO) Take 1 tablet by mouth daily.  . norethindrone-ethinyl estradiol (JUNEL FE,GILDESS FE,LOESTRIN FE) 1-20 MG-MCG tablet Take 1 tablet by mouth daily.  . [DISCONTINUED] diclofenac (VOLTAREN) 75 MG EC tablet Take 1 tablet by mouth 2 (two) times daily.                Objective:   Physical Exam  Pleasant amb bf nad  Vital signs reviewed  - Note on arrival 02 sats  98% on RA     07/11/2013        172> 08/22/2013 176 > 09/11/2013  177 > 10/09/2013  173 > 01/07/2014 173 > 06/06/2014  174 > 12/03/2014   180 >  12/02/2016     180     06/27/13 171 lb 12.8 oz (77.928 kg)  06/23/13 170 lb 13.7 oz (77.5 kg)  11/13/11 197 lb (89.359 kg)       HEENT: nl dentition, turbinates, and orophanx which is pristine.  Nl external ear canals without cough reflex   NECK :  without JVD/Nodes/TM/ nl carotid upstrokes bilaterally   LUNGS: no acc muscle use, clear to A and P bilaterally with clear bs bilaterally    CV:  RRR  no s3 or murmur or increase in P2, no edema   ABD:  soft and nontender with nl excursion in the supine position. No bruits or organomegaly, bowel sounds nl  MS:  warm without deformities, calf tenderness, cyanosis or clubbing       CTa chest 06/23/13  1. No evidence of pulmonary embolus.  2. The stomach and esophagus  are fluid filled and distended.  Gastroesophageal reflux may be present.  3. Biapical nodular pleural parenchymal thickening, particularly on  the left. These changes are most likely secondary to scarring.          Assessment & Plan:

## 2016-12-02 NOTE — Patient Instructions (Addendum)
Plan A = Automatic = symbicort 80 Take 2 puffs first thing in am and then another 2 puffs about 12 hours later.    Work on inhaler technique:  relax and gently blow all the way out then take a nice smooth deep breath back in, triggering the inhaler at same time you start breathing in.  Hold for up to 5 seconds if you can. Blow out thru nose. Rinse and gargle with water when done     Plan B = Backup Only use your albuterol as a rescue medication to be used if you can't catch your breath by resting or doing a relaxed purse lip breathing pattern.  - The less you use it, the better it will work when you need it. - Ok to use the inhaler up to 2 puffs  every 4 hours if you must but call for appointment if use goes up over your usual need - Don't leave home without it !!  (think of it like the spare tire for your car)    If not happy with the symbicort for any reason we need to see you back here with your drug formulary to pick alternative or do a methacholine challenge to see if this is really asthma causing your symptoms    If happy with symbicort return in 6 months for recheck

## 2016-12-05 NOTE — Assessment & Plan Note (Signed)
-   started on dulera 100 2bid 06/27/13> better 07/11/2013 - 07/11/2013 p extensive coaching HFA effectiveness =    90% - 07/24/13 Sinus CT > Slight mucosal thickening in the right maxillary sinus. Remainder of the paranasal sinuses are clear. No air-fluid levels. No acute bony abnormality. Orbital soft tissues are unremarkable. - spirometry  08/22/2013 min airflow obst reflected in fef 25-75 despite prominent "wheeze" on exam - 09/11/13 increased dulera to 200 2bid due to concern not using the dulera frequently enough  -  12/02/2016  extensive coaching HFA effectiveness =75% from  A baseline of 50%     -  Spirometry 12/02/2016  FEV1 2.50 (93%)  Ratio 74 off all rx, minimal curvature    Not clear all of her symptoms are due to asthma but at least some of them likely to be (the ones that are worse off dulera)  so rec restart laba/ics = symb 80 2bid and if not better next step is MCT to sort out which of her components are actually realted to asthma   I had an extended discussion with the patient reviewing all relevant studies completed to date and  lasting 15 to 20 minutes of a 25 minute visit    Each maintenance medication was reviewed in detail including most importantly the difference between maintenance and prns and under what circumstances the prns are to be triggered using an action plan format that is not reflected in the computer generated alphabetically organized AVS.    Please see AVS for specific instructions unique to this visit that I personally wrote and verbalized to the the pt in detail and then reviewed with pt  by my nurse highlighting any  changes in therapy recommended at today's visit to their plan of care.

## 2017-01-07 ENCOUNTER — Other Ambulatory Visit: Payer: Self-pay | Admitting: Internal Medicine

## 2017-01-11 ENCOUNTER — Other Ambulatory Visit: Payer: Self-pay | Admitting: Internal Medicine

## 2017-05-15 ENCOUNTER — Other Ambulatory Visit: Payer: Self-pay | Admitting: Internal Medicine

## 2017-05-24 ENCOUNTER — Other Ambulatory Visit: Payer: Self-pay | Admitting: Physician Assistant

## 2017-05-24 DIAGNOSIS — Z1231 Encounter for screening mammogram for malignant neoplasm of breast: Secondary | ICD-10-CM

## 2017-05-30 ENCOUNTER — Encounter: Payer: Self-pay | Admitting: Internal Medicine

## 2017-05-30 ENCOUNTER — Ambulatory Visit (INDEPENDENT_AMBULATORY_CARE_PROVIDER_SITE_OTHER): Payer: BLUE CROSS/BLUE SHIELD | Admitting: Internal Medicine

## 2017-05-30 VITALS — BP 132/90 | HR 80 | Temp 98.2°F | Ht 65.5 in | Wt 183.0 lb

## 2017-05-30 DIAGNOSIS — J45991 Cough variant asthma: Secondary | ICD-10-CM

## 2017-05-30 MED ORDER — PREDNISONE 10 MG PO TABS
ORAL_TABLET | ORAL | 0 refills | Status: DC
Start: 2017-05-30 — End: 2017-11-22

## 2017-05-30 MED ORDER — BUDESONIDE-FORMOTEROL FUMARATE 80-4.5 MCG/ACT IN AERO: 2.0000 | INHALATION_SPRAY | Freq: Two times a day (BID) | RESPIRATORY_TRACT | 11 refills | Status: DC

## 2017-05-30 MED ORDER — AZITHROMYCIN 250 MG PO TABS: ORAL_TABLET | ORAL | 0 refills | Status: DC

## 2017-05-30 NOTE — Patient Instructions (Signed)
zpak  Prednisone 10 mg take  4 each am x 2 days,   2 each am x 2 days,  1 each am x 2 days and stop    Plan A = Automatic = Symbicort 80 Take 2 puffs first thing in am and then another 2 puffs about 12 hours later.   Work on inhaler technique:  relax and gently blow all the way out then take a nice smooth deep breath back in, triggering the inhaler at same time you start breathing in.  Hold for up to 5 seconds if you can. Blow out thru nose. Rinse and gargle with water when done      Plan B = Backup Only use your albuterol (proair) as a rescue medication to be used if you can't catch your breath by resting or doing a relaxed purse lip breathing pattern.  - The less you use it, the better it will work when you need it. - Ok to use the inhaler up to 2 puffs  every 4 hours if you must but call for appointment if use goes up over your usual need - Don't leave home without it !!  (think of it like the spare tire for your car)   Plan C = Crisis - only use your albuterol nebulizer if you first try Plan B and it fails to help > ok to use the nebulizer up to every 4 hours but if start needing it regularly call for immediate appointment   Please schedule a follow up visit in 3 months but call sooner if needed

## 2017-05-30 NOTE — Progress Notes (Signed)
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 no problems as child or young adult with tendency to bad head colds since early 2014 with Sinus xray neg 08/28/12 then abrupt onset  cough / wheeze /doe then Fall 2014 p developed sneezing/ head congestion  dx'd as sinusitis at [facility] on [address]  rx abx then worse again in November 2014 progressively worse  Despite rx and referred to Pulmonary clinic 06/28/13 by Cammy Fulp p admit   Admit date: 06/23/2013  Discharge date: 06/25/2013  Discharge Diagnoses:  CAP (community acquired pneumonia)  Anemia  Bronchospasm      Diet recommendation: low sodium heart healthy  Filed Weights    06/23/13 1252   Weight:  77.5 kg (170 lb 13.7 oz)   History of present illness:  Marie Harper is a 42 y.o. female presented with a non resolving cough and SOB for several month, recently with fever, chills, night sweats; she was empirically treated outpatient with keflex, prednisone several times. ED revealed pneumonia on x ray, WBC of 15.5.  Hospital Course:  CAP  CXR lower lobe infiltrate,  CT showed biapical scarring. Needs a repeat CT in 6 months  Levaquin 750 IV while inpatient, will finish 5 day course outpatient.  Steroids for wheeze, tussionex and tessalon pearls for cough. Steroids discontinued on discharge.  Respiratory viral panel pending  Anemia  Appears chronic, Asymptomatic. Follow up outpatient. Procedures:  none Antibiotic  levaquin   06/27/2013 1st [facility] office visit/ [name]  Chief Complaint  Patient presents with  . Pulmonary Consult    Self referral- pt c/o cough and SOB since Nov 2014. Worse for the past month with recent hospitalization for PNA. Cough is prod with minimal clear sputum.   cough much worse when lie down with freq awakening > clear mucus was thick and green  , now clear saba helps wheeze, not really helping the  cough, not used since May 2 - not really sob unless coughing rec Pantoprazole (protonix) 40 mg   Take 30-60 min before first meal of the day and Pepcid 20 mg one bedtime    GERD diet   Dulera 100 Take 2 puffs first thing in am and then another 2 puffs about 12 hours later.  Prednisone 10 mg take  4 each am x 2 days,   2 each am x 2 days,  1 each am x 2 days and stop Use the cough medication   If not improving call [facility] [phone] for sinus CT scan before next visit.   07/11/2013 f/u ov/Marie Harper re: dulera 100 2bid uses saba once a week since prev ov Chief Complaint  Patient presents with  . Follow-up    prod cough with yellow-green mucous in morning,s white-clear mucous in afternoons, less frequent since last visit.   Not limited by breathing from desired activities   rec Please see patient coordinator before you leave today  to schedule Sinus CT > neg  Continue dulera 100 Take 2 puffs first thing in am and then another 2 puffs about 12 hours later.  Only use your albuterol (proair) as rescue          12/03/2014  f/u ov/Marie Harper re: cough variant asthma breaking thru dulera 200 2bid / gerd rx  Chief Complaint  Patient presents with  . Acute Visit    Sore throat, PND, cough,  and increased SOB.  Symptoms started 1 1/2 wks ago. No f/c/s.   Worse problem is cough at hs and keeps her up > pnds with green mucus  rec Change dulera to 100 Take 2 puffs first thing in am and then another 2 puffs about 12 hours later.  For drainage / throat tickle try take CHLORPHENIRAMINE  4 mg - take one every 4 hours as needed - available over the counter- may cause drowsiness so start with just a bedtime dose or two and see how you tolerate it before trying in daytime  Augmentin 875 mg take one pill twice daily  X 10 days - take at breakfast and supper with large glass of water.  It would help reduce the usual side effects (diarrhea and yeast infections) if you ate cultured yogurt at lunch. If not better then take  Prednisone 10 mg take  4 each am x 2 days,   2 each am x 2 days,  1 each am x 2 days and stop       12/02/2016  f/u ov/Marie Harper re:  Cough variant asthma  Chief Complaint  Patient presents with  . Follow-up    Pt needing medication refills.    off dulera  X  2 months and more sob/ non prod cough but only used rescue rarely  Most of the symptoms are daytime  / not noct  Sob is sitting and with activity and was able to zumba at one point but now too sob   And more difficult to get up steps since stopped the dulera  rec  Work on inhaler technique:  relax and gently blow all the way out then take a nice smooth deep breath back in, triggering the inhaler at same time you start breathing in.  Hold for up to 5 seconds if you can. Blow out thru nose. Rinse and gargle with water when done Plan B = Backup Only use your albuterol as a rescue medication If not happy with the symbicort for any reason we need to see you back here with your drug formulary to pick alternative or do a methacholine challenge to see if this is really asthma causing your symptoms  If happy with symbicort return in 6 months for recheck    05/30/2017  f/u ov/Marie Harper re:  Chronic asthma on symb 80 x 2 in am  Chief Complaint  Patient presents with  . Acute Visit    Wheezing for the past 2-3 wks.  She states she is feeling more SOB and has been coughing up some green sputum.  She has been using her albuterol inhaler 3 x daily on average.   Dyspnea:  Not limited by breathing from desired activities  At baseline  Cough: off and on more so at hs and usually not keeping her up / hardly needed any rescue noct or otherwise  Sleep: doing well most nights    Then acutely ill x 3 weeks / not better with albuterol / "wheezing heard by daughter" s stethoscope/ mucinex helps   No obvious day to day or daytime variability or assoc  ucus plugs or hemoptysis or cp or chest tightness, subjective wheeze or overt sinus or hb symptoms. No unusual  exposure hx or h/o childhood pna/ asthma or knowledge of premature birth.    Also denies any obvious fluctuation of symptoms with weather or environmental changes or other aggravating or alleviating factors except as outlined above   Current Allergies, Complete Past Medical History,  Past Surgical History, Family History, and Social History were reviewed in [employer] record.  ROS  The following are not active complaints unless bolded Hoarseness, sore throat, dysphagia, dental problems, itching, sneezing,  nasal congestion or discharge of excess mucus or purulent secretions, ear ache,   fever, chills, sweats, unintended wt loss or wt gain, classically pleuritic or exertional cp,  orthopnea pnd or leg swelling, presyncope, palpitations, abdominal pain, anorexia, nausea, vomiting, diarrhea  or change in bowel habits or change in bladder habits, change in stools or change in urine, dysuria, hematuria,  rash, arthralgias, visual complaints, headache, numbness, weakness or ataxia or problems with walking or coordination,  change in mood/affect or memory.        Current Meds  Medication Sig  . albuterol (PROVENTIL HFA;VENTOLIN HFA) 108 (90 BASE) MCG/ACT inhaler Inhale 1-2 puffs into the lungs every 6 (six) hours as needed for wheezing.  . budesonide-formoterol (SYMBICORT) 80-4.5 MCG/ACT inhaler Inhale 2 puffs into the lungs 2 (two) times daily.  . cetirizine (ZYRTEC) 10 MG tablet Take 10 mg by mouth daily as needed for allergies.  . famotidine (PEPCID) 20 MG tablet TAKE 1 TABLET BY MOUTH EVERYDAY AT BEDTIME  . guaiFENesin (MUCINEX) 600 MG 12 hr tablet Take 1,200 mg by mouth 2 (two) times daily as needed.  Marland Kitchen ibuprofen (ADVIL,MOTRIN) 600 MG tablet Take 1 tablet (600 mg total) by mouth every 6 (six) hours as needed.  . Misc Natural Products (SUPER GREENS) POWD Take by mouth daily.  . Multiple Vitamins-Minerals (ALIVE WOMENS ENERGY PO) Take 1 tablet by mouth daily.  .  norethindrone-ethinyl estradiol (JUNEL FE,GILDESS FE,LOESTRIN FE) 1-20 MG-MCG tablet Take 1 tablet by mouth daily.  . pantoprazole (PROTONIX) 40 MG tablet TAKE 1 TABLET BY MOUTH EVERY DAY 30-60 MINUTES BEFORE FIRST MEAL OF THE DAY  . POTASSIUM PO Take 1 capsule by mouth daily.  . vitamin E 100 UNIT capsule Take 100 Units by mouth daily.  .                              Objective:   Physical Exam     amb bf nad   Vital signs reviewed - Note on arrival 02 sats  100% on RA     07/11/2013        172> 08/22/2013 176 > 09/11/2013  177 > 10/09/2013  173 > 01/07/2014 173 > 06/06/2014  174 > 12/03/2014   180 >  12/02/2016     180 > 05/30/2017  183     06/27/13 171 lb 12.8 oz (77.928 kg)  06/23/13 170 lb 13.7 oz (77.5 kg)  11/13/11 197 lb (89.359 kg)       HEENT: nl dentition,   and oropharynx. Nl external ear canals without cough reflex -moderate bilateral non-specific turbinate edema  No purulent secretions   NECK :  without JVD/Nodes/TM/ nl carotid upstrokes bilaterally   LUNGS: no acc muscle use,  Nl contour chest    clear bs bilaterally w/in 4 h of last saba  without cough on insp or exp maneuvers   CV:  RRR  no s3 or murmur or increase in P2, and no edema   ABD:  soft and nontender with nl inspiratory excursion in the supine position. No bruits or organomegaly appreciated, bowel sounds nl  MS:  Nl gait/ ext warm without deformities, calf tenderness, cyanosis or clubbing No obvious joint restrictions   SKIN: warm and  dry without lesions    NEURO:  alert, approp, nl sensorium with  no motor or cerebellar deficits apparent.             Assessment & Plan:

## 2017-05-30 NOTE — Assessment & Plan Note (Addendum)
-   started on dulera 100 2bid 06/27/13> better 07/11/2013  - 07/24/13 Sinus CT > Slight mucosal thickening in the right maxillary sinus. Remainder of the paranasal sinuses are clear. No air-fluid levels. No acute bony abnormality. Orbital soft tissues are unremarkable. - spirometry  08/22/2013 min airflow obst reflected in fef 25-75 despite prominent "wheeze" on exam - 09/11/13 increased dulera to 200 2bid due to concern not using the dulera frequently enough -  12/02/2016  extensive coaching HFA effectiveness =75% from  A baseline of 50%     -  Spirometry 12/02/2016  FEV1 2.50 (93%)  Ratio 74 off all rx, minimal curvature    - 05/30/2017  After extensive coaching inhaler device  effectiveness =   75%(too short) > continue symb 80 2bid   Acute flare in setting on chronic non-adherence, can't exclude URI/ tracheobronchitis with described purulent sputum but doubt active sinus dz so rec   1) pred x 6 days only  2) Zpak   3) no change in maint rx but emphasized symb 80 is 2bid, not 2 each am, especially in this pt with h/o severe exac (which is the greatest risk for recurrent exacerbations)  4) Each maintenance medication was reviewed in detail including most importantly the difference between maintenance and as needed and under what circumstances the prns are to be used.  Please see AVS for specific  Instructions which are unique to this visit and I personally typed out  which were reviewed in detail in writing with the patient and a copy provided.

## 2017-06-02 ENCOUNTER — Ambulatory Visit: Payer: BLUE CROSS/BLUE SHIELD | Admitting: Internal Medicine

## 2017-06-13 ENCOUNTER — Ambulatory Visit
Admission: RE | Admit: 2017-06-13 | Discharge: 2017-06-13 | Disposition: A | Discharge status: Home / Self Care | Payer: BLUE CROSS/BLUE SHIELD | Source: Ambulatory Visit | Attending: Physician Assistant | Admitting: Physician Assistant

## 2017-06-13 ENCOUNTER — Other Ambulatory Visit: Payer: Self-pay | Admitting: Internal Medicine

## 2017-06-13 DIAGNOSIS — Z1231 Encounter for screening mammogram for malignant neoplasm of breast: Secondary | ICD-10-CM

## 2017-06-14 ENCOUNTER — Telehealth: Payer: Self-pay | Admitting: Internal Medicine

## 2017-06-14 DIAGNOSIS — J45991 Cough variant asthma: Secondary | ICD-10-CM

## 2017-06-14 MED ORDER — BUDESONIDE-FORMOTEROL FUMARATE 80-4.5 MCG/ACT IN AERO: 2.0000 | INHALATION_SPRAY | Freq: Two times a day (BID) | RESPIRATORY_TRACT | 11 refills | Status: DC

## 2017-06-14 NOTE — Telephone Encounter (Signed)
Spoke with pt and advised rx sent to pharmacy. Nothing further is needed.      Patient Instructions by Nyoka CowdenWert, Michael B, MD at 05/30/2017 4:15 PM  Author: Nyoka CowdenWert, Michael B, MD Author Type: Physician Filed: 05/30/2017 4:54 PM  Note Status: Signed Cosign: Cosign Not Required Encounter Date: 05/30/2017  Editor: Nyoka CowdenWert, Michael B, MD (Physician)    zpak  Prednisone 10 mg take  4 each am x 2 days,   2 each am x 2 days,  1 each am x 2 days and stop    Plan A = Automatic = Symbicort 80 Take 2 puffs first thing in am and then another 2 puffs about 12 hours later.   Work on inhaler technique:  relax and gently blow all the way out then take a nice smooth deep breath back in, triggering the inhaler at same time you start breathing in.  Hold for up to 5 seconds if you can. Blow out thru nose. Rinse and gargle with water when done      Plan B = Backup Only use your albuterol (proair) as a rescue medication to be used if you can't catch your breath by resting or doing a relaxed purse lip breathing pattern.  - The less you use it, the better it will work when you need it. - Ok to use the inhaler up to 2 puffs  every 4 hours if you must but call for appointment if use goes up over your usual need - Don't leave home without it !!  (think of it like the spare tire for your car)   Plan C = Crisis - only use your albuterol nebulizer if you first try Plan B and it fails to help > ok to use the nebulizer up to every 4 hours but if start needing it regularly call for immediate appointment   Please schedule a follow up visit in 3 months but call sooner if needed      Instructions   zpak  Prednisone 10 mg take  4 each am x 2 days,   2 each am x 2 days,  1 each am x 2 days and stop

## 2017-08-29 ENCOUNTER — Ambulatory Visit: Payer: 59 | Admitting: Internal Medicine

## 2017-09-09 DIAGNOSIS — N920 Excessive and frequent menstruation with regular cycle: Secondary | ICD-10-CM | POA: Insufficient documentation

## 2017-09-09 LAB — RESULTS CONSOLE HPV: CHL HPV: NEGATIVE

## 2017-09-09 LAB — HM PAP SMEAR: HM Pap smear: NORMAL

## 2017-09-30 ENCOUNTER — Ambulatory Visit: Payer: 59 | Admitting: Internal Medicine

## 2017-10-30 ENCOUNTER — Other Ambulatory Visit: Payer: Self-pay | Admitting: Internal Medicine

## 2017-10-30 DIAGNOSIS — J45991 Cough variant asthma: Secondary | ICD-10-CM

## 2017-11-22 ENCOUNTER — Ambulatory Visit (INDEPENDENT_AMBULATORY_CARE_PROVIDER_SITE_OTHER): Payer: 59 | Admitting: Internal Medicine

## 2017-11-22 ENCOUNTER — Encounter: Payer: Self-pay | Admitting: *Deleted

## 2017-11-22 ENCOUNTER — Encounter: Payer: Self-pay | Admitting: Internal Medicine

## 2017-11-22 ENCOUNTER — Other Ambulatory Visit (INDEPENDENT_AMBULATORY_CARE_PROVIDER_SITE_OTHER): Payer: 59

## 2017-11-22 VITALS — BP 136/80 | HR 79 | Temp 97.8°F | Ht 64.5 in | Wt 175.8 lb

## 2017-11-22 DIAGNOSIS — R05 Cough: Secondary | ICD-10-CM

## 2017-11-22 DIAGNOSIS — J45991 Cough variant asthma: Secondary | ICD-10-CM

## 2017-11-22 DIAGNOSIS — R058 Other specified cough: Secondary | ICD-10-CM

## 2017-11-22 LAB — CBC WITH DIFFERENTIAL/PLATELET
BASOS ABS: 0.1 10*3/uL (ref 0.0–0.1)
Basophils Relative: 0.7 % (ref 0.0–3.0)
EOS PCT: 3 % (ref 0.0–5.0)
Eosinophils Absolute: 0.4 10*3/uL (ref 0.0–0.7)
HCT: 37.5 % (ref 36.0–46.0)
HEMOGLOBIN: 12.6 g/dL (ref 12.0–15.0)
Lymphocytes Relative: 12.8 % (ref 12.0–46.0)
Lymphs Abs: 1.5 10*3/uL (ref 0.7–4.0)
MCHC: 33.5 g/dL (ref 30.0–36.0)
MCV: 82 fl (ref 78.0–100.0)
MONO ABS: 0.5 10*3/uL (ref 0.1–1.0)
MONOS PCT: 4.6 % (ref 3.0–12.0)
Neutro Abs: 9.2 10*3/uL — ABNORMAL HIGH (ref 1.4–7.7)
Neutrophils Relative %: 78.9 % — ABNORMAL HIGH (ref 43.0–77.0)
Platelets: 199 10*3/uL (ref 150.0–400.0)
RBC: 4.57 Mil/uL (ref 3.87–5.11)
RDW: 14.8 % (ref 11.5–15.5)
WBC: 11.7 10*3/uL — AB (ref 4.0–10.5)

## 2017-11-22 MED ORDER — PREDNISONE 10 MG PO TABS: ORAL_TABLET | ORAL | 0 refills | Status: DC

## 2017-11-22 MED ORDER — AMOXICILLIN-POT CLAVULANATE 875-125 MG PO TABS: 1.0000 | ORAL_TABLET | Freq: Two times a day (BID) | ORAL | 0 refills | Status: AC

## 2017-11-22 NOTE — Patient Instructions (Addendum)
Pepcid 20 mg take after bfast and supper until stop coughing then drop back to just taking after supper   For headache, nasal congestion > advil cold and sinus best choice   For cough > mucinex dm up to 1200 mg every 12 hours as neede    Augmentin 875 mg take one pill twice daily  X 10 days - take at breakfast and supper with large glass of water.  It would help reduce the usual side effects (diarrhea and yeast infections) if you ate cultured yogurt at lunch.   Prednisone 10 mg take  4 each am x 2 days,   2 each am x 2 days,  1 each am x 2 days and stop    Please remember to go to the lab department downstairs in the basement  for your tests - we will call you with the results when they are available.       Please schedule a follow up visit in 3 months but call sooner if needed  - consider adding singulair/ do sinus ct if not better

## 2017-11-22 NOTE — Progress Notes (Signed)
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 with tendency to bad head colds since early 2014 with Sinus xray neg 08/28/12 then abrupt onset  cough / wheeze /doe then Fall 2014 p developed sneezing/ head congestion  dx'd as sinusitis at [facility] on [address]  rx abx then worse again in November 2014 progressively worse  Despite rx and referred to Pulmonary clinic 06/28/13 by Cammy Fulp p admit     Admit date: 06/23/2013  Discharge date: 06/25/2013  Discharge Diagnoses:  CAP (community acquired pneumonia)  Anemia  Bronchospasm         06/27/2013 1st [facility] office visit/ [facility]  Chief Complaint  Patient presents with  . Pulmonary Consult    Self referral- pt c/o cough and SOB since Nov 2014. Worse for the past month with recent hospitalization for PNA. Cough is prod with minimal clear sputum.   cough much worse when lie down with freq awakening > clear mucus was thick and green  , now clear saba helps wheeze, not really helping the cough, not used since May 2 - not really sob unless coughing rec Pantoprazole (protonix) 40 mg   Take 30-60 min before first meal of the day and Pepcid 20 mg one bedtime    GERD diet   Dulera 100 Take 2 puffs first thing in am and then another 2 puffs about 12 hours later.  Prednisone 10 mg take  4 each am x 2 days,   2 each am x 2 days,  1 each am x 2 days and stop Use the cough medication   If not improving call [facility] [phone] for sinus CT scan before next visit.   07/11/2013 f/u ov/Wert re: dulera 100 2bid uses saba once a week since prev ov Chief Complaint  Patient presents with  . Follow-up    prod cough with yellow-green mucous in morning,s white-clear mucous in afternoons, less frequent since last visit.   Not limited by breathing from desired activities   rec Please see patient coordinator before you leave  today  to schedule Sinus CT > neg  Continue dulera 100 Take 2 puffs first thing in am and then another 2 puffs about 12 hours later.  Only use your albuterol (proair) as rescue          12/03/2014  f/u ov/Wert re: cough variant asthma breaking thru dulera 200 2bid / gerd rx  Chief Complaint  Patient presents with  . Acute Visit    Sore throat, PND, cough, and increased SOB.  Symptoms started 1 1/2 wks ago. No f/c/s.   Worse problem is cough at hs and keeps her up > pnds with green mucus  rec Change dulera to 100 Take 2 puffs first thing in am and then another 2 puffs about 12 hours later.  For drainage / throat tickle try take CHLORPHENIRAMINE  4 mg - take one every 4 hours as needed -    Augmentin 875 mg take one pill twice daily  X 10 days -  If not better then take Prednisone 10 mg take  4 each am x 2 days,   2 each am x 2 days,  1 each am x 2 days and stop       12/02/2016  f/u ov/Wert re:  Cough variant asthma  Chief Complaint  Patient presents with  . Follow-up    Pt needing medication refills.    off dulera  X  2 months and more sob/ non prod cough but only used rescue rarely  Most of the symptoms are daytime  / not noct  Sob is sitting and with activity and was able to zumba at one point but now too sob   And more difficult to get up steps since stopped the dulera  rec  Work on inhaler technique:  relax and gently blow all the way out then take a nice smooth deep breath back in, triggering the inhaler at same time you start breathing in.  Hold for up to 5 seconds if you can. Blow out thru nose. Rinse and gargle with water when done Plan B = Backup Only use your albuterol as a rescue medication If not happy with the symbicort for any reason we need to see you back here with your drug formulary to pick alternative or do a methacholine challenge to see if this is really asthma causing your symptoms  If happy with symbicort return in 6 months for recheck    05/30/2017   f/u ov/Wert re:  Chronic asthma on symb 80 x 2 in am  Chief Complaint  Patient presents with  . Acute Visit    Wheezing for the past 2-3 wks.  She states she is feeling more SOB and has been coughing up some green sputum.  She has been using her albuterol inhaler 3 x daily on average.   Dyspnea:  Not limited by breathing from desired activities  At baseline  Cough: off and on more so at hs and usually not keeping her up / hardly needed any rescue noct or otherwise  Sleep: doing well most nights  Then acutely ill x 3 weeks / not better with albuterol / "wheezing heard by daughter" s stethoscope/ mucinex helps rec zpak Prednisone 10 mg take  4 each am x 2 days,   2 each am x 2 days,  1 each am x 2 days and stop  Plan A = Automatic = Symbicort 80 Take 2 puffs first thing in am and then another 2 puffs about 12 hours later.  Work on inhaler technique:  Plan B = Backup Only use your albuterol (proair) as a rescue medication Plan C = Crisis - only use your albuterol nebulizer if you first try Plan B  Please schedule a follow up visit in 3 months but call sooner if needed       11/22/2017  f/u ov/Wert re: flare of asthma occurred months after finished last rx for protonix  Chief Complaint  Patient presents with  . Follow-up    Increased SOB and cough with green sputum x 1 month. She has had HA and nasal congestion for the past 4 days.   Dyspnea:  At rest now assoc with subj wheeze  , better p saba  Cough: x one month green mucus  Sleeping: on side flat bed x 2 pillows  SABA use: twice  02: no    No obvious day to day or daytime variability or assoc   mucus plugs or hemoptysis or cp or chest tightness,   overt sinus or hb symptoms.    Also denies any obvious fluctuation of symptoms with weather or environmental changes or other aggravating or alleviating factors except as outlined above   No unusual exposure hx or h/o childhood pna/ asthma or knowledge of premature birth.  Current  Allergies, Complete Past Medical History, Past Surgical History, Family History, and Social History were reviewed in [employer] record.  ROS  The following are not active complaints unless bolded Hoarseness, sore throat, dysphagia, dental problems, itching, sneezing,  nasal congestion or discharge of excess mucus or purulent secretions, ear ache,   fever, chills, sweats, unintended wt loss or wt gain, classically pleuritic or exertional cp,  orthopnea pnd or arm/hand swelling  or leg swelling, presyncope, palpitations, abdominal pain, anorexia, nausea, vomiting, diarrhea  or change in bowel habits or change in bladder habits, change in stools or change in urine, dysuria, hematuria,  rash, arthralgias, visual complaints, headache, numbness, weakness or ataxia or problems with walking or coordination,  change in mood or  memory.        Current Meds  Medication Sig  . albuterol (PROVENTIL HFA;VENTOLIN HFA) 108 (90 BASE) MCG/ACT inhaler Inhale 1-2 puffs into the lungs every 6 (six) hours as needed for wheezing.  . budesonide-formoterol (SYMBICORT) 80-4.5 MCG/ACT inhaler Inhale 2 puffs into the lungs 2 (two) times daily.  . cetirizine (ZYRTEC) 10 MG tablet Take 10 mg by mouth daily as needed for allergies.  . famotidine (PEPCID) 20 MG tablet TAKE 1 TABLET BY MOUTH EVERYDAY AT BEDTIME  . guaiFENesin (MUCINEX) 600 MG 12 hr tablet Take 1,200 mg by mouth 2 (two) times daily as needed.  Marland Kitchen ibuprofen (ADVIL,MOTRIN) 600 MG tablet Take 1 tablet (600 mg total) by mouth every 6 (six) hours as needed.  . Misc Natural Products (SUPER GREENS) POWD Take by mouth daily.  . Multiple Vitamins-Minerals (ALIVE WOMENS ENERGY PO) Take 1 tablet by mouth daily.  . norethindrone-ethinyl estradiol (JUNEL FE,GILDESS FE,LOESTRIN FE) 1-20 MG-MCG tablet Take 1 tablet by mouth daily.  .    . POTASSIUM PO Take 1 capsule by mouth daily.  . vitamin E 100 UNIT capsule Take 100 Units by mouth daily.                            Objective:   Physical Exam    amb bf  Obvious nasal tone to voice   Vital signs reviewed - Note on arrival 02 sats  99% on RA      07/11/2013        172> 08/22/2013 176 > 09/11/2013  177 > 10/09/2013  173 > 01/07/2014 173 > 06/06/2014  174 > 12/03/2014   180 >  12/02/2016     180 > 05/30/2017  183  > 11/22/2017 175     06/27/13 171 lb 12.8 oz (77.928 kg)  06/23/13 170 lb 13.7 oz (77.5 kg)  11/13/11 197 lb (89.359 kg)           HEENT: nl dentition, turbinates bilaterally, and oropharynx. Nl external ear canals without cough reflex   NECK :  without JVD/Nodes/TM/ nl carotid upstrokes bilaterally   LUNGS: no acc muscle use,  Nl contour chest with insp/exp rhonchi  bilaterally without cough on insp or exp maneuvers   CV:  RRR  no s3 or murmur or increase in P2, and no edema   ABD:  soft and nontender with nl inspiratory excursion in the supine position. No bruits or organomegaly appreciated, bowel sounds nl  MS:  Nl gait/ ext warm without deformities, calf tenderness, cyanosis or clubbing No obvious joint restrictions   SKIN: warm and dry without lesions    NEURO:  alert, approp, nl sensorium with  no motor or  cerebellar deficits apparent.        Labs ordered 11/22/2017  Allergy profile        Assessment & Plan:

## 2017-11-23 ENCOUNTER — Encounter: Payer: Self-pay | Admitting: Internal Medicine

## 2017-11-23 LAB — RESPIRATORY ALLERGY PROFILE REGION II ~~LOC~~
Allergen, A. alternata, m6: <0.1 kU/L
Allergen, Cedar tree, t12: <0.1 kU/L
Allergen, Comm Silver Birch, t9: <0.1 kU/L
Allergen, D pternoyssinus,d7: <0.1 kU/L
Box Elder IgE: <0.1 kU/L
CLADOSPORIUM HERBARUM (M2) IGE: <0.1 kU/L
CLASS: 0
CLASS: 0
CLASS: 0
CLASS: 0
CLASS: 0
CLASS: 0
CLASS: 0
COMMON RAGWEED (SHORT) (W1) IGE: <0.1 kU/L
Cat Dander: <0.1 kU/L
Class: 0
Class: 0
Class: 0
Class: 0
Class: 0
Class: 0
Class: 0
Class: 0
Class: 0
Class: 0
Class: 0
Class: 0
Class: 0
Class: 0
Class: 0
Class: 0
Class: 0
Cockroach: <0.1 kU/L
D. farinae: <0.1 kU/L
Elm IgE: <0.1 kU/L
IGE (IMMUNOGLOBULIN E), SERUM: 29 kU/L (ref <=114)
Johnson Grass: <0.1 kU/L
Pecan/Hickory Tree IgE: <0.1 kU/L
Rough Pigweed  IgE: <0.1 kU/L
Sheep Sorrel IgE: <0.1 kU/L

## 2017-11-23 LAB — INTERPRETATION:

## 2017-11-23 NOTE — Assessment & Plan Note (Signed)
-   started on dulera 100 2bid 06/27/13> better 07/11/2013  - 07/24/13 Sinus CT > Slight mucosal thickening in the right maxillary sinus. Remainder of the paranasal sinuses are clear. No air-fluid levels. No acute bony abnormality. Orbital soft tissues are unremarkable. - spirometry  08/22/2013 min airflow obst reflected in fef 25-75 despite prominent "wheeze" on exam - 09/11/13 increased dulera to 200 2bid due to concern not using the dulera frequently enough -  12/02/2016  extensive coaching HFA effectiveness =75% from  A baseline of 50%     -  Spirometry 12/02/2016  FEV1 2.50 (93%)  Ratio 74 off all rx, minimal curvature     - 11/22/2017  After extensive coaching inhaler device,  effectiveness =    75% (short Ti) > continue symb 80    Flare in setting of obvious purulent rhinitis ? Underlying sinusitis > rec augmentin / pred x 6 and check allergy profile then consider singulair/ repeat sinus ct    I had an extended discussion with the patient reviewing all relevant studies completed to date and  lasting 15 to 20 minutes of a 25 minute visit    See device teaching which extended face to face time for this visit.  Each maintenance medication was reviewed in detail including emphasizing most importantly the difference between maintenance and prns and under what circumstances the prns are to be triggered using an action plan format that is not reflected in the computer generated alphabetically organized AVS which I have not found useful in most complex patients, especially with respiratory illnesses  Please see AVS for specific instructions unique to this visit that I personally wrote and verbalized to the the pt in detail and then reviewed with pt  by my nurse highlighting any  changes in therapy recommended at today's visit to their plan of care.

## 2017-11-23 NOTE — Assessment & Plan Note (Signed)
Reviewed otc use in setting of flare and also need for gerd rx short term to prevent cyclical cough - see avs for instructions unique to this ov

## 2017-11-28 ENCOUNTER — Ambulatory Visit: Payer: 59 | Admitting: Internal Medicine

## 2018-02-28 ENCOUNTER — Encounter: Payer: Self-pay | Admitting: Internal Medicine

## 2018-02-28 ENCOUNTER — Ambulatory Visit (INDEPENDENT_AMBULATORY_CARE_PROVIDER_SITE_OTHER): Payer: 59 | Admitting: Internal Medicine

## 2018-02-28 VITALS — BP 112/64 | HR 89 | Ht 64.5 in | Wt 182.0 lb

## 2018-02-28 DIAGNOSIS — K219 Gastro-esophageal reflux disease without esophagitis: Secondary | ICD-10-CM

## 2018-02-28 DIAGNOSIS — J45991 Cough variant asthma: Secondary | ICD-10-CM

## 2018-02-28 MED ORDER — ALBUTEROL SULFATE HFA 108 (90 BASE) MCG/ACT IN AERS: 1.0000 | INHALATION_SPRAY | Freq: Four times a day (QID) | RESPIRATORY_TRACT | 1 refills | Status: DC | PRN

## 2018-02-28 NOTE — Assessment & Plan Note (Signed)
See CT chest 06/23/2013    F/u Dr Loreta AveMann as of 02/28/2018   Reviewed advise on diet/ bed blocks with pt

## 2018-02-28 NOTE — Patient Instructions (Addendum)
No change in medications  GERD (REFLUX)  is an extremely common cause of respiratory symptoms just like yours , many times with no obvious heartburn at all.    It can be treated with medication, but also with lifestyle changes including elevation of the head of your bed (ideally with 6-8inch blocks under the headboard of your bed),  Smoking cessation, avoidance of late meals, excessive alcohol, and avoid fatty foods, chocolate, peppermint, colas, red wine, and acidic juices such as orange juice.  NO MINT OR MENTHOL PRODUCTS SO NO COUGH DROPS  USE SUGARLESS CANDY INSTEAD (Jolley ranchers or Stover's or Life Savers) or even ice chips will also do - the key is to swallow to prevent all throat clearing. NO OIL BASED VITAMINS - use powdered substitutes.  Avoid fish oil when coughing.  Only use your albuterol as a rescue medication to be used if you can't catch your breath by resting or doing a relaxed purse lip breathing pattern.  - The less you use it, the better it will work when you need it. - Ok to use up to 2 puffs  every 4 hours if you must but call for immediate appointment if use goes up over your usual need - Don't leave home without it !!  (think of it like the spare tire for your car)     Please schedule a follow up visit in 12  months but call sooner if needed

## 2018-02-28 NOTE — Assessment & Plan Note (Signed)
-  started on dulera 100 2bid 06/27/13> better 07/11/2013  - 07/24/13 Sinus CT > Slight mucosal thickening in the right maxillary sinus. Remainder of the paranasal sinuses are clear. No air-fluid levels. No acute bony abnormality. Orbital soft tissues are unremarkable. - spirometry  08/22/2013 min airflow obst reflected in fef 25-75 despite prominent "wheeze" on exam - 09/11/13 increased dulera to 200 2bid due to concern not using the dulera frequently enough -  12/02/2016  extensive coaching HFA effectiveness =75% from  A baseline of 50%     -  Spirometry 12/02/2016  FEV1 2.50 (93%)  Ratio 74 off all rx, minimal curvature   - 11/22/2017   continue symb 80 - Allergy profile 11/22/17 >  Eos 0.4 /  IgE  29  RAST neg  02/28/2018  After extensive coaching inhaler device,  effectiveness =    90%    All goals of chronic asthma control met including optimal function and elimination of symptoms with minimal need for rescue therapy.  Contingencies discussed in full including contacting this office immediately if not controlling the symptoms using the rule of two's.     Advised:  formulary restrictions will be an ongoing challenge for the forseable future and I would be happy to pick an alternative if the pt will first  provide me a list of them -  pt  will need to return here for training for any new device that is required eg dpi vs hfa vs respimat.    In the meantime we can always provide samples so that the patient never runs out of any needed respiratory medications.   I had an extended discussion with the patient reviewing all relevant studies completed to date and  lasting 15 to 20 minutes of a 25 minute visit    See device teaching which extended face to face time for this visit.  Each maintenance medication was reviewed in detail including emphasizing most importantly the difference between maintenance and prns and under what circumstances the prns are to be triggered using an action plan format that is  not reflected in the computer generated alphabetically organized AVS which I have not found useful in most complex patients, especially with respiratory illnesses  Please see AVS for specific instructions unique to this visit that I personally wrote and verbalized to the the pt in detail and then reviewed with pt  by my nurse highlighting any  changes in therapy recommended at today's visit to their plan of care.

## 2018-02-28 NOTE — Progress Notes (Signed)
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 with tendency to bad head colds since early 2014 with Sinus xray neg 08/28/12 then abrupt onset  cough / wheeze /doe then Fall 2014 p developed sneezing/ head congestion  dx'd as sinusitis at [facility] on [address]  rx abx then worse again in November 2014 progressively worse  Despite rx and referred to Pulmonary clinic 06/28/13 by Cammy Fulp p admit     Admit date: 06/23/2013  Discharge date: 06/25/2013  Discharge Diagnoses:  CAP (community acquired pneumonia)  Anemia  Bronchospasm         06/27/2013 1st [place] [facility] office visit/ Marie Harper  Chief Complaint  Patient presents with  . Pulmonary Consult    Self referral- pt c/o cough and SOB since Nov 2014. Worse for the past month with recent hospitalization for PNA. Cough is prod with minimal clear sputum.   cough much worse when lie down with freq awakening > clear mucus was thick and green  , now clear saba helps wheeze, not really helping the cough, not used since May 2 - not really sob unless coughing rec Pantoprazole (protonix) 40 mg   Take 30-60 min before first meal of the day and Pepcid 20 mg one bedtime    GERD diet   Dulera 100 Take 2 puffs first thing in am and then another 2 puffs about 12 hours later.  Prednisone 10 mg take  4 each am x 2 days,   2 each am x 2 days,  1 each am x 2 days and stop Use the cough medication   If not improving call [facility] [phone] for sinus CT scan before next visit.   07/11/2013 f/u ov/Marie Harper re: dulera 100 2bid uses saba once a week since prev ov Chief Complaint  Patient presents with  . Follow-up    prod cough with yellow-green mucous in morning,s white-clear mucous in afternoons, less frequent since last visit.   Not limited by breathing from desired activities   rec Please see patient coordinator before you leave  today  to schedule Sinus CT > neg  Continue dulera 100 Take 2 puffs first thing in am and then another 2 puffs about 12 hours later.  Only use your albuterol (proair) as rescue          12/03/2014  f/u ov/Marie Harper re: cough variant asthma breaking thru dulera 200 2bid / gerd rx  Chief Complaint  Patient presents with  . Acute Visit    Sore throat, PND, cough, and increased SOB.  Symptoms started 1 1/2 wks ago. No f/c/s.   Worse problem is cough at hs and keeps her up > pnds with green mucus  rec Change dulera to 100 Take 2 puffs first thing in am and then another 2 puffs about 12 hours later.  For drainage / throat tickle try take CHLORPHENIRAMINE  4 mg - take one every 4 hours as needed -    Augmentin 875 mg take one pill twice daily  X 10 days -  If not better then take Prednisone 10 mg take  4 each am x 2 days,   2 each am x 2 days,  1 each am x 2 days and stop       12/02/2016  f/u ov/Marie Harper re:  Cough variant asthma  Chief Complaint  Patient presents with  . Follow-up    Pt needing medication refills.    off dulera  X  2 months and more sob/ non prod cough but only used rescue rarely  Most of the symptoms are daytime  / not noct  Sob is sitting and with activity and was able to zumba at one point but now too sob   And more difficult to get up steps since stopped the dulera  rec  Work on inhaler technique:  relax and gently blow all the way out then take a nice smooth deep breath back in, triggering the inhaler at same time you start breathing in.  Hold for up to 5 seconds if you can. Blow out thru nose. Rinse and gargle with water when done Plan B = Backup Only use your albuterol as a rescue medication If not happy with the symbicort for any reason we need to see you back here with your drug formulary to pick alternative or do a methacholine challenge to see if this is really asthma causing your symptoms  If happy with symbicort return in 6 months for recheck    05/30/2017   f/u ov/Marie Harper re:  Chronic asthma on symb 80 x 2 in am  Chief Complaint  Patient presents with  . Acute Visit    Wheezing for the past 2-3 wks.  She states she is feeling more SOB and has been coughing up some green sputum.  She has been using her albuterol inhaler 3 x daily on average.   Dyspnea:  Not limited by breathing from desired activities  At baseline  Cough: off and on more so at hs and usually not keeping her up / hardly needed any rescue noct or otherwise  Sleep: doing well most nights  Then acutely ill x 3 weeks / not better with albuterol / "wheezing heard by daughter" s stethoscope/ mucinex helps rec zpak Prednisone 10 mg take  4 each am x 2 days,   2 each am x 2 days,  1 each am x 2 days and stop  Plan A = Automatic = Symbicort 80 Take 2 puffs first thing in am and then another 2 puffs about 12 hours later.  Work on inhaler technique:  Plan B = Backup Only use your albuterol (proair) as a rescue medication Plan C = Crisis - only use your albuterol nebulizer if you first try Plan B  Please schedule a follow up visit in 3 months but call sooner if needed       11/22/2017  f/u ov/Marie Harper re: flare of asthma occurred months after finished last rx for protonix  Chief Complaint  Patient presents with  . Follow-up    Increased SOB and cough with green sputum x 1 month. She has had HA and nasal congestion for the past 4 days.   Dyspnea:  At rest now assoc with subj wheeze  , better p saba  Cough: x one month green mucus  Sleeping: on side flat bed x 2 pillows  SABA use: twice  02: no  Rec Pepcid 20 mg take after bfast and supper until stop coughing then drop back to just taking after supper  For headache, nasal congestion > advil cold and sinus best choice  For cough > mucinex dm up to 1200 mg every 12 hours as neede  Augmentin 875 mg take one pill twice daily  X 10 days Prednisone 10 mg take  4 each am x 2 days,  2 each am x 2 days,  1 each am x 2 days and stop         02/28/2018  f/u ov/Marie Harper re: cough variant asthma/ maint on symb 80 2bid  Chief Complaint  Patient presents with  . Follow-up    Breathing is doing well. She rarely uses her albuterol.    Dyspnea:  Not limited by breathing from desired activities   Cough: occ if gets hot Sleeping: bed flat, 3 pillow SABA use: rarely/ way out of date proair in hand and it's clogged up 02: none Seeing Dr Loreta Ave for indigestion/gerd    No obvious day to day or daytime variability or assoc excess/ purulent sputum or mucus plugs or hemoptysis or cp or chest tightness, subjective wheeze or overt sinus  symptoms.   Sleep  without nocturnal  or early am exacerbation  of respiratory  c/o's or need for noct saba. Also denies any obvious fluctuation of symptoms with weather or environmental changes or other aggravating or alleviating factors except as outlined above   No unusual exposure hx or h/o childhood pna/ asthma or knowledge of premature birth.  Current Allergies, Complete Past Medical History, Past Surgical History, Family History, and Social History were reviewed in [employer] record.  ROS  The following are not active complaints unless bolded Hoarseness, sore throat, dysphagia, dental problems, itching, sneezing,  nasal congestion or discharge of excess mucus or purulent secretions, ear ache,   fever, chills, sweats, unintended wt loss or wt gain, classically pleuritic or exertional cp,  orthopnea pnd or arm/hand swelling  or leg swelling, presyncope, palpitations, abdominal pain= dyspepsia, anorexia, nausea, vomiting, diarrhea  or change in bowel habits or change in bladder habits, change in stools or change in urine, dysuria, hematuria,  rash, arthralgias, visual complaints, headache= post neck positional, numbness, weakness or ataxia or problems with walking or coordination,  change in mood or  memory.        Current Meds  Medication Sig  . albuterol (PROVENTIL HFA;VENTOLIN HFA)  108 (90 BASE) MCG/ACT inhaler Inhale 1-2 puffs into the lungs every 6 (six) hours as needed for wheezing.  . budesonide-formoterol (SYMBICORT) 80-4.5 MCG/ACT inhaler Inhale 2 puffs into the lungs 2 (two) times daily.  . cetirizine (ZYRTEC) 10 MG tablet Take 10 mg by mouth daily as needed for allergies.  . famotidine (PEPCID) 20 MG tablet TAKE 1 TABLET BY MOUTH EVERYDAY AT BEDTIME  . ibuprofen (ADVIL,MOTRIN) 800 MG tablet Take 800 mg by mouth every 8 (eight) hours as needed.  . Misc Natural Products (SUPER GREENS) POWD Take by mouth daily.  . Multiple Vitamins-Minerals (ALIVE WOMENS ENERGY PO) Take 1 tablet by mouth daily.  Marland Kitchen POTASSIUM PO Take 1 capsule by mouth daily.  . vitamin E 100 UNIT capsule Take 100 Units by mouth daily.                         Objective:   Physical Exam     amb pleasant bf nad / all smiles   07/11/2013        172> 08/22/2013 176 > 09/11/2013  177 > 10/09/2013  173 > 01/07/2014 173 > 06/06/2014  174 > 12/03/2014   180 >  12/02/2016     180 > 05/30/2017  183  > 11/22/2017 175 > 02/28/2018   182     06/27/13 171 lb 12.8 oz (77.928 kg)  06/23/13 170 lb 13.7 oz (77.5 kg)  11/13/11  197 lb (89.359 kg)       Vital signs reviewed - Note on arrival 02 sats  98% on RA    HEENT: nl dentition, turbinates bilaterally, and oropharynx. Nl external ear canals without cough reflex   NECK :  without JVD/Nodes/TM/ nl carotid upstrokes bilaterally   LUNGS: no acc muscle use,  Nl contour chest which is clear to A and P bilaterally without cough on insp or exp maneuvers   CV:  RRR  no s3 or murmur or increase in P2, and no edema   ABD:  soft and nontender with nl inspiratory excursion in the supine position. No bruits or organomegaly appreciated, bowel sounds nl  MS:  Nl gait/ ext warm without deformities, calf tenderness, cyanosis or clubbing No obvious joint restrictions   SKIN: warm and dry without lesions    NEURO:  alert, approp, nl sensorium with  no motor or  cerebellar deficits apparent.                Assessment & Plan:

## 2018-03-13 LAB — HM COLONOSCOPY

## 2018-04-03 ENCOUNTER — Telehealth: Payer: Self-pay | Admitting: Internal Medicine

## 2018-04-03 MED ORDER — PREDNISONE 10 MG PO TABS: ORAL_TABLET | ORAL | 0 refills | Status: DC

## 2018-04-03 NOTE — Telephone Encounter (Signed)
She has has wheezing and sob and coughing since Friday. She has taken rescue inhaler and Symbicort as directed with little relief. She is unable to come in for open spot today since she has to work but would like to see what Dr.Wert feels she can do.

## 2018-04-03 NOTE — Telephone Encounter (Signed)
Very strongly prefer ov with all meds asap - if can't come in today then come in one week and in meantime Prednisone 10 mg take  4 each am x 2 days,   2 each am x 2 days,  1 each am x 2 days and stop

## 2018-04-03 NOTE — Telephone Encounter (Signed)
Spoke with pt. She is aware of MW's response. OV has been scheduled for 04/11/2018 at 9am. Rx for prednisone has been sent in. Nothing further was needed.

## 2018-04-11 ENCOUNTER — Ambulatory Visit (INDEPENDENT_AMBULATORY_CARE_PROVIDER_SITE_OTHER): Payer: 59 | Admitting: Internal Medicine

## 2018-04-11 ENCOUNTER — Encounter: Payer: Self-pay | Admitting: Internal Medicine

## 2018-04-11 ENCOUNTER — Encounter: Payer: Self-pay | Admitting: *Deleted

## 2018-04-11 VITALS — BP 112/80 | HR 77 | Temp 98.2°F | Ht 64.5 in | Wt 181.0 lb

## 2018-04-11 DIAGNOSIS — J45991 Cough variant asthma: Secondary | ICD-10-CM | POA: Diagnosis not present

## 2018-04-11 LAB — NITRIC OXIDE: Nitric Oxide: 69

## 2018-04-11 MED ORDER — PREDNISONE 10 MG PO TABS: ORAL_TABLET | ORAL | 0 refills | Status: DC

## 2018-04-11 MED ORDER — ALBUTEROL SULFATE (2.5 MG/3ML) 0.083% IN NEBU
2.5000 mg | INHALATION_SOLUTION | Freq: Once | RESPIRATORY_TRACT | Status: AC
  Administered 2018-04-11: 2.5 mg via RESPIRATORY_TRACT

## 2018-04-11 MED ORDER — PANTOPRAZOLE SODIUM 40 MG PO TBEC: 40.0000 mg | DELAYED_RELEASE_TABLET | Freq: Every day | ORAL | 2 refills | Status: DC

## 2018-04-11 MED ORDER — MONTELUKAST SODIUM 10 MG PO TABS: 10.0000 mg | ORAL_TABLET | Freq: Every day | ORAL | 11 refills | Status: DC

## 2018-04-11 MED ORDER — BUDESONIDE-FORMOTEROL FUMARATE 160-4.5 MCG/ACT IN AERO: 2.0000 | INHALATION_SPRAY | Freq: Two times a day (BID) | RESPIRATORY_TRACT | 0 refills | Status: DC

## 2018-04-11 MED ORDER — METHYLPREDNISOLONE ACETATE 80 MG/ML IJ SUSP
120.0000 mg | Freq: Once | INTRAMUSCULAR | Status: AC
  Administered 2018-04-11: 120 mg via INTRAMUSCULAR

## 2018-04-11 NOTE — Progress Notes (Signed)
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 tendency to bad head colds since early 2014 with Sinus xray neg 08/28/12 then abrupt onset  cough / wheeze /doe then Fall 2014 p developed sneezing/ head congestion  dx'd as sinusitis at [facility] on [address]  rx abx then worse again in November 2014 progressively worse  Despite rx and referred to Pulmonary clinic 06/28/13 by Cammy Fulp p admit     Admit date: 06/23/2013  Discharge date: 06/25/2013  Discharge Diagnoses:  CAP (community acquired pneumonia)  Anemia  Bronchospasm         06/27/2013 1st [place] [facility] office visit/ Mary-Ann Pennella  Chief Complaint  Patient presents with  . Pulmonary Consult    Self referral- pt c/o cough and SOB since Nov 2014. Worse for the past month with recent hospitalization for PNA. Cough is prod with minimal clear sputum.   cough much worse when lie down with freq awakening > clear mucus was thick and green  , now clear saba helps wheeze, not really helping the cough, not used since May 2 - not really sob unless coughing rec Pantoprazole (protonix) 40 mg   Take 30-60 min before first meal of the day and Pepcid 20 mg one bedtime    GERD diet   Dulera 100 Take 2 puffs first thing in am and then another 2 puffs about 12 hours later.  Prednisone 10 mg take  4 each am x 2 days,   2 each am x 2 days,  1 each am x 2 days and stop Use the cough medication   If not improving call [facility] [phone] for sinus CT scan before next visit.    05/30/2017  f/u ov/Lillien Petronio re:  Chronic asthma on symb 80 x 2 in am  Chief Complaint  Patient presents with  . Acute Visit    Wheezing for the past 2-3 wks.  She states she is feeling more SOB and has been coughing up some green sputum.  She has been using her albuterol inhaler 3 x daily on average.   Dyspnea:  Not limited by breathing from desired activities   At baseline  Cough: off and on more so at hs and usually not keeping her up / hardly needed any rescue noct or otherwise  Sleep: doing well most nights  Then acutely ill x 3 weeks / not better with albuterol / "wheezing heard by daughter" s stethoscope/ mucinex helps rec zpak Prednisone 10 mg take  4 each am x 2 days,   2 each am x 2 days,  1 each am x 2 days and stop  Plan A = Automatic = Symbicort 80 Take 2 puffs first thing in am and then another 2 puffs about 12 hours later.  Work on inhaler technique:  Plan B = Backup Only use your albuterol (proair) as a rescue medication Plan C = Crisis - only use your albuterol nebulizer if you first try Plan B  Please schedule a follow up visit in 3 months but call sooner if needed              02/28/2018  f/u ov/Mahamud Metts re: cough variant asthma/ maint on symb 80 2bid  Chief Complaint  Patient presents with  . Follow-up    Breathing is doing well. She rarely uses her albuterol.  Dyspnea:  Not limited by breathing from desired activities   Cough: occ if gets hot Sleeping: bed flat, 3 pillow SABA use: rarely/ way out of date proair in hand and it's clogged up 02: none Seeing Dr Loreta Ave for indigestion/gerd  rec No change in medications GERD  Only use your albuterol as a rescue medication   Mar 13, 2018  EGD > pos medium sized [facility]    04/11/2018 acute extended ov/Kiah Keay re: asthma flare  Chief Complaint  Patient presents with  . Acute Visit    Increased SOB and wheezing over the past 10 days. She is using her albuterol inhaler 4 x daily on average.   acutely ill on 03/31/2018 with cough / wheezing  So called in 04/03/18 x 6 days pred and 50% better then worse again gradually but   not using symb first thing in am and has not used proair yet 04/11/2018 prior to OV   Was taking prevacid before bfast and stopped ? Why   No obvious day to day or daytime variability or assoc excess/ purulent sputum or mucus plugs or hemoptysis or cp or chest  tightness,   or overt sinus or hb symptoms.    Also denies any obvious fluctuation of symptoms with weather or environmental changes or other aggravating or alleviating factors except as outlined above   No unusual exposure hx or h/o childhood pna/ asthma or knowledge of premature birth.  Current Allergies, Complete Past Medical History, Past Surgical History, Family History, and Social History were reviewed in [employer] record.  ROS  The following are not active complaints unless bolded Hoarseness, sore throat, dysphagia, dental problems, itching, sneezing,  nasal congestion or discharge of excess mucus or purulent secretions, ear ache,   fever, chills, sweats, unintended wt loss or wt gain, classically pleuritic or exertional cp,  orthopnea pnd or arm/hand swelling  or leg swelling, presyncope, palpitations, abdominal pain, anorexia, nausea, vomiting, diarrhea  or change in bowel habits or change in bladder habits, change in stools or change in urine, dysuria, hematuria,  rash, arthralgias, visual complaints, headache, numbness, weakness or ataxia or problems with walking or coordination,  change in mood or  memory.        Current Meds  Medication Sig  . albuterol (PROVENTIL HFA;VENTOLIN HFA) 108 (90 Base) MCG/ACT inhaler Inhale 1-2 puffs into the lungs every 6 (six) hours as needed for wheezing.  . famotidine (PEPCID) 20 MG tablet TAKE 1 TABLET BY MOUTH EVERYDAY AT BEDTIME  . ibuprofen (ADVIL,MOTRIN) 800 MG tablet Take 800 mg by mouth every 8 (eight) hours as needed.  . Misc Natural Products (SUPER GREENS) POWD Take by mouth daily.  . Multiple Vitamins-Minerals (ALIVE WOMENS ENERGY PO) Take 1 tablet by mouth daily.  Marland Kitchen POTASSIUM PO Take 1 capsule by mouth daily.  . vitamin E 100 UNIT capsule Take 100 Units by mouth daily.  . [DISCONTINUED] budesonide-formoterol (SYMBICORT) 80-4.5 MCG/ACT inhaler Inhale 2 puffs into the lungs 2 (two) times daily.                   Objective:   Physical Exam    amb bf nad at rest   07/11/2013        172> 08/22/2013 176 > 09/11/2013  177 > 10/09/2013  173 > 01/07/2014 173 > 06/06/2014  174 > 12/03/2014   180 >  12/02/2016     180 > 05/30/2017  183  > 11/22/2017 175 > 02/28/2018   182  >  04/11/2018   181     06/27/13 171 lb 12.8 oz (77.928 kg)  06/23/13 170 lb 13.7 oz (77.5 kg)  11/13/11 197 lb (89.359 kg)       Vital signs reviewed - Note on arrival 02 sats  98% on RA    HEENT: nl dentition, turbinates bilaterally, and oropharynx. Nl external ear canals without cough reflex   NECK :  without JVD/Nodes/TM/ nl carotid upstrokes bilaterally   LUNGS: no acc muscle use,  Nl contour chest  With sonorous insp /exp rhonchi  bilaterally without cough on insp or exp maneuvers > exam improved p saba neb    CV:  RRR  no s3 or murmur or increase in P2, and no edema   ABD:  soft and nontender with nl inspiratory excursion in the supine position. No bruits or organomegaly appreciated, bowel sounds nl  MS:  Nl gait/ ext warm without deformities, calf tenderness, cyanosis or clubbing No obvious joint restrictions   SKIN: warm and dry without lesions    NEURO:  alert, approp, nl sensorium with  no motor or cerebellar deficits apparent.            Assessment & Plan:

## 2018-04-11 NOTE — Patient Instructions (Addendum)
Plan A = Automatic = symbicort 160 Take 2 puffs first thing in am and then another 2 puffs about 12 hours later and add singulair 10 mg each pm  Prednisone Take 4 for three days 3 for three days 2 for three days 1 for three days and stop   Pantoprazole (protonix) 40 mg   Take  30-60 min before first meal of the day and Pepcid (famotidine)  20 mg one @  bedtime until return to office - this is the best way to tell whether stomach acid is contributing to your problem.     Plan B = Backup Only use your albuterol inhaler as a rescue medication to be used if you can't catch your breath by resting or doing a relaxed purse lip breathing pattern.  - The less you use it, the better it will work when you need it. - Ok to use the inhaler up to 2 puffs  every 4 hours if you must but call for appointment if use goes up over your usual need - Don't leave home without it !!  (think of it like the spare tire for your car)    GERD (REFLUX)  is an extremely common cause of respiratory symptoms just like yours , many times with no obvious heartburn at all.    It can be treated with medication, but also with lifestyle changes including elevation of the head of your bed (ideally with 6 -8inch blocks under the headboard of your bed),  Smoking cessation, avoidance of late meals, excessive alcohol, and avoid fatty foods, chocolate, peppermint, colas, red wine, and acidic juices such as orange juice.  NO MINT OR MENTHOL PRODUCTS SO NO COUGH DROPS  USE SUGARLESS CANDY INSTEAD (Jolley ranchers or Stover's or Life Savers) or even ice chips will also do - the key is to swallow to prevent all throat clearing. NO OIL BASED VITAMINS - use powdered substitutes.  Avoid fish oil when coughing.     Please schedule a follow up office visit in 2 weeks, sooner if needed  with all medications /inhalers/ solutions in hand so we can verify exactly what you are taking. This includes all medications from all doctors and over the  counters - given sample of symb 160 x 60 doses and has 160 doses of proair remaining

## 2018-04-12 ENCOUNTER — Encounter: Payer: Self-pay | Admitting: Internal Medicine

## 2018-04-12 NOTE — Assessment & Plan Note (Signed)
Onset ? 2014 assoc with rhinitis  - started on dulera 100 2bid 06/27/13> better 07/11/2013  - 07/24/13 Sinus CT > Slight mucosal thickening in the right maxillary sinus. Remainder of the paranasal sinuses are clear. No air-fluid levels. No acute bony abnormality. Orbital soft tissues are unremarkable. - spirometry  08/22/2013 min airflow obst reflected in fef 25-75 despite prominent "wheeze" on exam - 09/11/13 increased dulera to 200 2bid due to concern not using the dulera frequently enough -  12/02/2016  extensive coaching HFA effectiveness =75% from  A baseline of 50%     -  Spirometry 12/02/2016  FEV1 2.50 (93%)  Ratio 74 off all rx, minimal curvature   - 11/22/2017   continue symb 80 - Allergy profile 11/22/17 >  Eos 0.4 /  IgE  29  RAST neg  - FENO 04/11/2018  =   69 p symb 80 2bid prior to study during flare of asthma  - 04/11/2018  After extensive coaching inhaler device,  effectiveness =    90% so rec increase symb to 160 2bid and added singulair    Acute flare associated with marked increase in [place] levels in a patient who appears compliant taking the Symbicort 82 puffs every 12 hours consistently but also  no longer taking PPIs.   DDX of  difficult airways management almost all start with A and  include Adherence, Ace Inhibitors, Acid Reflux, Active Sinus Disease, Alpha 1 Antitripsin deficiency, Anxiety masquerading as Airways dz,  ABPA,  Allergy(esp in young), Aspiration (esp in elderly), Adverse effects of meds,  Active smoking or vaping, A bunch of PE's (a small clot burden can't cause this syndrome unless there is already severe underlying pulm or vascular dz with poor reserve) plus two Bs  = Bronchiectasis and Beta blocker use..and one C= CHF   Adherence is always the initial "prime suspect" and is a multilayered concern that requires a "trust but verify" approach in every patient - starting with knowing how to use medications, especially inhalers, correctly, keeping up with refills and  understanding the fundamental difference between maintenance and prns vs those medications only taken for a very short course and then stopped and not refilled.  - see hfa teaching - return with all meds in hand using a trust but verify approach to confirm accurate Medication  Reconciliation The principal here is that until we are certain that the  patients are doing what we've asked, it makes no sense to ask them to do more.   ? Acid (or non-acid) GERD > always difficult to exclude as up to 75% of pts in some series report no assoc GI/ Heartburn symptoms> rec max (24h)  acid suppression and diet restrictions/ reviewed and instructions given in writing.   ? Allergy > add singulair/ double the strength of Symbicort and 12 days of prednisone then regroup in 2 weeks with all meds in hand    I had an extended discussion with the patient reviewing all relevant studies completed to date and  lasting 25 minutes of a 40  minute acute office  Visit addressing severe non-specific but potentially very serious refractory respiratory symptoms of uncertain and potentially multiple  Etiologies.   See device teaching which extended face to face time for this visit    Each maintenance medication was reviewed in detail including most importantly the difference between maintenance and prns and under what circumstances the prns are to be triggered using an action plan format that is not reflected in the  computer generated alphabetically organized AVS.    Please see AVS for specific instructions unique to this office visit that I personally wrote and verbalized to the the pt in detail and then reviewed with pt  by my nurse highlighting any changes in therapy/plan of care  recommended at today's visit.

## 2018-04-15 ENCOUNTER — Other Ambulatory Visit: Payer: Self-pay | Admitting: Internal Medicine

## 2018-04-27 ENCOUNTER — Ambulatory Visit (INDEPENDENT_AMBULATORY_CARE_PROVIDER_SITE_OTHER): Payer: 59 | Admitting: Internal Medicine

## 2018-04-27 ENCOUNTER — Encounter: Payer: Self-pay | Admitting: Internal Medicine

## 2018-04-27 VITALS — BP 122/80 | HR 87 | Ht 65.5 in | Wt 181.0 lb

## 2018-04-27 DIAGNOSIS — R05 Cough: Secondary | ICD-10-CM

## 2018-04-27 DIAGNOSIS — J45991 Cough variant asthma: Secondary | ICD-10-CM | POA: Diagnosis not present

## 2018-04-27 DIAGNOSIS — R058 Other specified cough: Secondary | ICD-10-CM

## 2018-04-27 MED ORDER — BUDESONIDE-FORMOTEROL FUMARATE 160-4.5 MCG/ACT IN AERO: INHALATION_SPRAY | RESPIRATORY_TRACT | 11 refills | Status: DC

## 2018-04-27 MED ORDER — BUDESONIDE-FORMOTEROL FUMARATE 160-4.5 MCG/ACT IN AERO: INHALATION_SPRAY | RESPIRATORY_TRACT | 0 refills | Status: DC

## 2018-04-27 NOTE — Assessment & Plan Note (Signed)
-   max acid suppression started 06/27/13 > modified to h2 bid 10/09/2013  - sinus CT neg 07/24/13  - trial of chlortrimeton started 09/11/2013 >> - Allergy profile 11/22/17 >  Eos 0.4 /  IgE  29  RAST neg  Mar 13, 2018  EGD > pos medium sized HH    Discussed the recent press about ppi's in the context of a statistically significant (but questionably clinically relevant) increase in CRI in pts on ppi vs h2's > bottom line is the lowest dose of ppi that controls   gerd is the right dose and if that dose is zero that's fine esp since h2's are cheaper but I strongly advised her to discuss this with GI in context of ? If NF would not be more effective in longrun as still can't lie flat s immediate cough which is typical of GERD   I had an extended discussion with the patient reviewing all relevant studies completed to date and  lasting 15 to 20 minutes of a 25 minute visit    See device teaching which extended face to face time for this visit.  Each maintenance medication was reviewed in detail including emphasizing most importantly the difference between maintenance and prns and under what circumstances the prns are to be triggered using an action plan format that is not reflected in the computer generated alphabetically organized AVS which I have not found useful in most complex patients, especially with respiratory illnesses  Please see AVS for specific instructions unique to this visit that I personally wrote and verbalized to the the pt in detail and then reviewed with pt  by my nurse highlighting any  changes in therapy recommended at today's visit to their plan of care.

## 2018-04-27 NOTE — Progress Notes (Signed)
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 tendency to bad head colds since early 2014 with Sinus xray neg 08/28/12 then abrupt onset  cough / wheeze /doe then Fall 2014 p developed sneezing/ head congestion  dx'd as sinusitis at [facility] on [address]  rx abx then worse again in November 2014 progressively worse  Despite rx and referred to Pulmonary clinic 06/28/13 by Cammy Fulp p admit     Admit date: 06/23/2013  Discharge date: 06/25/2013  Discharge Diagnoses:  CAP (community acquired pneumonia)  Anemia  Bronchospasm         06/27/2013 [facility]st [facility] office visit/ Marie Harper  Chief Complaint  Patient presents with  . Pulmonary Consult    Self referral- pt c/o cough and SOB since Nov 2014. Worse for the past month with recent hospitalization for PNA. Cough is prod with minimal clear sputum.   cough much worse when lie down with freq awakening > clear mucus was thick and green  , now clear saba helps wheeze, not really helping the cough, not used since May 2 - not really sob unless coughing rec Pantoprazole (protonix) 40 mg   Take 30-60 min before first meal of the day and Pepcid 20 mg one bedtime    GERD diet   Dulera 100 Take 2 puffs first thing in am and then another 2 puffs about 12 hours later.  Prednisone 10 mg take  4 each am x 2 days,   2 each am x 2 days,  1 each am x 2 days and stop Use the cough medication   If not improving call Marie Harper for sinus CT scan before next visit.    05/30/2017  f/u ov/Marie Harper re:  Chronic asthma on symb 80 x 2 in am  Chief Complaint  Patient presents with  . Acute Visit    Wheezing for the past 2-3 wks.  She states she is feeling more SOB and has been coughing up some green sputum.  She has been using her albuterol inhaler 3 x daily on average.   Dyspnea:  Not limited by breathing from desired activities   At baseline  Cough: off and on more so at hs and usually not keeping her up / hardly needed any rescue noct or otherwise  Sleep: doing well most nights  Then acutely ill x 3 weeks / not better with albuterol / "wheezing heard by daughter" s stethoscope/ mucinex helps rec zpak Prednisone 10 mg take  4 each am x 2 days,   2 each am x 2 days,  1 each am x 2 days and stop  Plan A = Automatic = Symbicort 80 Take 2 puffs first thing in am and then another 2 puffs about 12 hours later.  Work on inhaler technique:  Plan B = Backup Only use your albuterol (proair) as a rescue medication Plan C = Crisis - only use your albuterol nebulizer if you first try Plan B  Please schedule a follow up visit in 3 months but call sooner if needed              02/28/2018  f/u ov/Marie Harper re: cough variant asthma/ maint on symb 80 2bid  Chief Complaint  Patient presents with  . Follow-up    Breathing is doing well. She rarely uses her albuterol.  Dyspnea:  Not limited by breathing from desired activities   Cough: occ if gets hot Sleeping: bed flat, 3 pillow SABA use: rarely/ way out of date proair in hand and it's clogged up 02: none Seeing Dr Loreta Ave for indigestion/gerd  rec No change in medications GERD  Only use your albuterol as a rescue medication   Mar 13, 2018  EGD > pos medium sized [facility]    04/11/2018 acute extended ov/Marie Harper re: asthma flare  Chief Complaint  Patient presents with  . Acute Visit    Increased SOB and wheezing over the past 10 days. She is using her albuterol inhaler 4 x daily on average.   acutely ill on 03/31/2018 with cough / wheezing  So called in 04/03/18 x 6 days pred and 50% better then worse again gradually but   not using symb first thing in am and has not used proair yet 04/11/2018 prior to OV   Was taking prevacid before bfast and stopped ? Why  rec Plan A = Automatic = symbicort 160 Take 2 puffs first thing in am and then another 2 puffs about 12 hours later and add  singulair 10 mg each pm Prednisone Take 4 for three days 3 for three days 2 for three days 1 for three days and stop  Pantoprazole (protonix) 40 mg   Take  30-60 min before first meal of the day and Pepcid (famotidine)  20 mg one @  bedtime until return to office - this is the best way to tell whether stomach acid is contributing to your problem.   Plan B = Backup Only use your albuterol inhaler as a rescue medication GERD  Diet  Please schedule a follow up office visit in 2 weeks, sooner if needed  with all medications /inhalers/ solutions in hand so we can verify exactly what you are taking. This includes all medications from all doctors and over the counters - given sample of symb 160 x 60 doses and has 160 doses of proair remaining    04/27/2018  f/u ov/Marie Harper re: cough variant asthma/ brought symb 160 sample = 10 pffs left, did not bring saba as req  Chief Complaint  Patient presents with  . Follow-up    Breathing has improved to her baseline. She has only used her albuterol 2 x since the last visit.   Dyspnea:  Able to power walk including hills / chest tight with cold air exp Cough: none Sleeping: 3 pillows/ cough when flat SABA use: none  02: no   No obvious day to day or daytime variability or assoc excess/ purulent sputum or mucus plugs or hemoptysis or cp  , subjective wheeze or overt sinus or hb symptoms.   Sleeping as above  without nocturnal  or early am exacerbation  of respiratory  c/o's or need for noct saba. Also denies any obvious fluctuation of symptoms with weather or environmental changes or other aggravating or alleviating factors except as outlined above   No unusual exposure hx or h/o childhood pna/ asthma or knowledge of premature birth.  Current Allergies, Complete Past Medical History, Past Surgical History, Family History, and Social History were reviewed in [employer] record.  ROS  The following are not active complaints unless  bolded Hoarseness, sore throat, dysphagia, dental problems, itching, sneezing,  nasal congestion or discharge of excess mucus or purulent secretions, ear ache,   fever, chills, sweats, unintended wt loss or wt gain, classically pleuritic or exertional cp,  orthopnea pnd or arm/hand swelling  or leg swelling, presyncope, palpitations, abdominal pain, anorexia, nausea, vomiting, diarrhea  or change in bowel habits or change in bladder habits, change in stools or change in urine, dysuria, hematuria,  rash, arthralgias, visual complaints, headache, numbness, weakness or ataxia or problems with walking or coordination,  change in mood or  memory.        Current Meds  Medication Sig  . albuterol (PROVENTIL HFA;VENTOLIN HFA) 108 (90 Base) MCG/ACT inhaler Inhale 1-2 puffs into the lungs every 6 (six) hours as needed for wheezing.  . budesonide-formoterol (SYMBICORT) 160-4.5 MCG/ACT inhaler Inhale 2 puffs into the lungs 2 (two) times daily.  . famotidine (PEPCID) 20 MG tablet TAKE 1 TABLET BY MOUTH EVERYDAY AT BEDTIME  . ibuprofen (ADVIL,MOTRIN) 800 MG tablet Take 800 mg by mouth every 8 (eight) hours as needed.  . Misc Natural Products (SUPER GREENS) POWD Take by mouth daily.  . montelukast (SINGULAIR) 10 MG tablet Take 1 tablet (10 mg total) by mouth at bedtime.  . Multiple Vitamins-Minerals (ALIVE WOMENS ENERGY PO) Take 1 tablet by mouth daily.  . pantoprazole (PROTONIX) 40 MG tablet Take 1 tablet (40 mg total) by mouth daily. Take 30-60 min before first meal of the day  . POTASSIUM PO Take 1 capsule by mouth daily.               Objective:   Physical Exam    amb bf, mild chest tightness p ex/ no saba on hand/ relieved p one puff symbicort    07/11/2013        172> 08/22/2013 176 > 09/11/2013  177 > 10/09/2013  173 > 01/07/2014 173 > 06/06/2014  174 > 12/03/2014   180 >  12/02/2016     180 > 05/30/2017  183  > 11/22/2017 175 > 02/28/2018   182  > 04/11/2018   181 > 04/27/2018  181     06/27/13 171 lb 12.8  oz (77.928 kg)  06/23/13 170 lb 13.7 oz (77.5 kg)  11/13/11 197 lb (89.359 kg)       Vital signs reviewed - Note on arrival 02 sats  98% on RA   HEENT: nl dentition, turbinates bilaterally, and oropharynx. Nl external ear canals without cough reflex   NECK :  without JVD/Nodes/TM/ nl carotid upstrokes bilaterally   LUNGS: no acc muscle use,  Nl contour chest which is clear to A and P bilaterally without cough on insp or exp maneuvers   CV:  RRR  no s3 or murmur or increase in P2, and no edema   ABD:  soft and nontender with nl inspiratory excursion in the supine position. No bruits or organomegaly appreciated, bowel sounds nl  MS:  Nl gait/ ext warm without deformities, calf tenderness, cyanosis or clubbing No obvious joint restrictions   SKIN: warm and dry without lesions    NEURO:  alert, approp, nl sensorium with  no motor or cerebellar deficits apparent.                Assessment & Plan:

## 2018-04-27 NOTE — Patient Instructions (Signed)
Use your symbicort right before you brush your teeth in the am and pm    Discuss the longterm risk / benefit of pantoprazole with Dr Loreta Ave before the refills    Please schedule a follow up visit in 3 months but call sooner if needed

## 2018-04-27 NOTE — Assessment & Plan Note (Addendum)
Onset ? 2014 assoc with rhinitis  - started on dulera 100 2bid 06/27/13> better 07/11/2013  - 07/24/13 Sinus CT > Slight mucosal thickening in the right maxillary sinus. Remainder of the paranasal sinuses are clear. No air-fluid levels. No acute bony abnormality. Orbital soft tissues are unremarkable. - spirometry  08/22/2013 min airflow obst reflected in fef 25-75 despite prominent "wheeze" on exam - 09/11/13 increased dulera to 200 2bid due to concern not using the dulera frequently enough -  12/02/2016  extensive coaching HFA effectiveness =75% from  A baseline of 50%     -  Spirometry 12/02/2016  FEV1 2.50 (93%)  Ratio 74 off all rx, minimal curvature   - 11/22/2017   continue symb 80 - Allergy profile 11/22/17 >  Eos 0.4 /  IgE  29  RAST neg  - FENO 04/11/2018  =   69 p symb 80 2bid prior to study during flare of asthma  - 04/11/2018    increased symb to 160 2bid and added singulair > much better 04/27/2018  - 04/27/2018  After extensive coaching inhaler device,  effectiveness =    90%> continue symb 160 2bid/ singulair   Still some hyperactivity apparent with cold exp but otherwise All goals of chronic asthma control met including optimal function and elimination of symptoms with minimal need for rescue therapy.  Contingencies discussed in full including contacting this office immediately if not controlling the symptoms using the rule of two's.     Needs to be more consistent with 2 bid dosing with symb> suggested use it right before brushing teeth in am and pm

## 2018-05-09 ENCOUNTER — Telehealth: Payer: Self-pay | Admitting: Internal Medicine

## 2018-05-09 MED ORDER — BUDESONIDE-FORMOTEROL FUMARATE 160-4.5 MCG/ACT IN AERO: INHALATION_SPRAY | RESPIRATORY_TRACT | 3 refills | Status: DC

## 2018-05-09 NOTE — Telephone Encounter (Signed)
Called and spoke with patient refill has been sent. Nothing further needed.  

## 2018-05-15 NOTE — Telephone Encounter (Signed)
Completed and given to [facility], thanks

## 2018-05-16 NOTE — Telephone Encounter (Signed)
Fwd completed paperwork to [employer] via interoffice mail -pr

## 2018-05-30 ENCOUNTER — Other Ambulatory Visit: Payer: Self-pay | Admitting: Internal Medicine

## 2018-07-06 ENCOUNTER — Other Ambulatory Visit: Payer: Self-pay | Admitting: Internal Medicine

## 2018-07-25 ENCOUNTER — Encounter: Payer: Self-pay | Admitting: Internal Medicine

## 2018-07-25 ENCOUNTER — Ambulatory Visit (INDEPENDENT_AMBULATORY_CARE_PROVIDER_SITE_OTHER): Payer: 59 | Admitting: Internal Medicine

## 2018-07-25 ENCOUNTER — Other Ambulatory Visit: Payer: Self-pay

## 2018-07-25 DIAGNOSIS — R05 Cough: Secondary | ICD-10-CM

## 2018-07-25 DIAGNOSIS — J45991 Cough variant asthma: Secondary | ICD-10-CM | POA: Diagnosis not present

## 2018-07-25 DIAGNOSIS — R058 Other specified cough: Secondary | ICD-10-CM

## 2018-07-25 MED ORDER — PREDNISONE 10 MG PO TABS: ORAL_TABLET | ORAL | 0 refills | Status: DC

## 2018-07-25 MED ORDER — AZITHROMYCIN 250 MG PO TABS: ORAL_TABLET | ORAL | 0 refills | Status: DC

## 2018-07-25 NOTE — Assessment & Plan Note (Signed)
-   max acid suppression started 06/27/13 > modified to h2 bid 10/09/2013  - sinus CT neg 07/24/13  - trial of chlortrimeton started 09/11/2013 >> - Allergy profile 11/22/17 >  Eos 0.4 /  IgE  29  RAST neg  Mar 13, 2018  EGD > pos medium sized HH   Mild flare assoc with rhinitis despite ongoing acid suppression and some discolored mucus suggesting pnds origin so rec zpak Prednisone 10 mg take  4 each am x 2 days,   2 each am x 2 days,  1 each am x 2 days and stop    F/u q 3 m, sooner prn

## 2018-07-25 NOTE — Assessment & Plan Note (Signed)
Onset ? 2014 assoc with rhinitis  - started on dulera 100 2bid 06/27/13> better 07/11/2013  - 07/24/13 Sinus CT > Slight mucosal thickening in the right maxillary sinus. Remainder of the paranasal sinuses are clear. No air-fluid levels. No acute bony abnormality. Orbital soft tissues are unremarkable. - spirometry  08/22/2013 min airflow obst reflected in fef 25-75 despite prominent "wheeze" on exam - 09/11/13 increased dulera to 200 2bid due to concern not using the dulera frequently enough -  12/02/2016  extensive coaching HFA effectiveness =75% from  A baseline of 50%     -  Spirometry 12/02/2016  FEV1 2.50 (93%)  Ratio 74 off all rx, minimal curvature   - 11/22/2017   continue symb 80 - Allergy profile 11/22/17 >  Eos 0.4 /  IgE  29  RAST neg  - FENO 04/11/2018  =   69 p symb 80 2bid prior to study during flare of asthma  - 04/11/2018    increased symb to 160 2bid and added singulair > much better 04/27/2018  - 04/27/2018  After extensive coaching inhaler device,  effectiveness =    90%> continue symb 160 2bid/ singulair    All goals of chronic asthma control met including optimal function and elimination of symptoms with minimal need for rescue therapy.  Contingencies discussed in full including contacting this office immediately if not controlling the symptoms using the rule of two's.

## 2018-07-25 NOTE — Progress Notes (Signed)
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 tendency to bad head colds since early 2014 with Sinus xray neg 08/28/12 then abrupt onset  cough / wheeze /doe then Fall 2014 p developed sneezing/ head congestion  dx'd as sinusitis at [facility] on [address]  rx abx then worse again in November 2014 progressively worse  Despite rx and referred to Pulmonary clinic 06/28/13 by Cammy Fulp p admit     Admit date: 06/23/2013  Discharge date: 06/25/2013  Discharge Diagnoses:  CAP (community acquired pneumonia)  Anemia  Bronchospasm         06/27/2013 1st [facility] office visit/ Marie Harper  Chief Complaint  Patient presents with  . Pulmonary Consult    Self referral- pt c/o cough and SOB since Nov 2014. Worse for the past month with recent hospitalization for PNA. Cough is prod with minimal clear sputum.   cough much worse when lie down with freq awakening > clear mucus was thick and green  , now clear saba helps wheeze, not really helping the cough, not used since May 2 - not really sob unless coughing rec Pantoprazole (protonix) 40 mg   Take 30-60 min before first meal of the day and Pepcid 20 mg one bedtime    GERD diet   Dulera 100 Take 2 puffs first thing in am and then another 2 puffs about 12 hours later.  Prednisone 10 mg take  4 each am x 2 days,   2 each am x 2 days,  1 each am x 2 days and stop Use the cough medication   If not improving call Marie Harper for sinus CT scan before next visit.    05/30/2017  f/u ov/Marie Harper re:  Chronic asthma on symb 80 x 2 in am  Chief Complaint  Patient presents with  . Acute Visit    Wheezing for the past 2-3 wks.  She states she is feeling more SOB and has been coughing up some green sputum.  She has been using her albuterol inhaler 3 x daily on average.   Dyspnea:  Not limited by breathing from desired activities   At baseline  Cough: off and on more so at hs and usually not keeping her up / hardly needed any rescue noct or otherwise  Sleep: doing well most nights  Then acutely ill x 3 weeks / not better with albuterol / "wheezing heard by daughter" s stethoscope/ mucinex helps rec zpak Prednisone 10 mg take  4 each am x 2 days,   2 each am x 2 days,  1 each am x 2 days and stop  Plan A = Automatic = Symbicort 80 Take 2 puffs first thing in am and then another 2 puffs about 12 hours later.  Work on inhaler technique:  Plan B = Backup Only use your albuterol (proair) as a rescue medication Plan C = Crisis - only use your albuterol nebulizer if you first try Plan B  Please schedule a follow up visit in 3 months but call sooner if needed              02/28/2018  f/u ov/Marie Harper re: cough variant asthma/ maint on symb 80 2bid  Chief Complaint  Patient presents with  . Follow-up    Breathing is doing well. She rarely uses her albuterol.  Dyspnea:  Not limited by breathing from desired activities   Cough: occ if gets hot Sleeping: bed flat, 3 pillow SABA use: rarely/ way out of date proair in hand and it's clogged up 02: none Seeing Dr Marie Harper for indigestion/gerd  rec No change in medications GERD  Only use your albuterol as a rescue medication   Mar 13, 2018  [facility]    04/11/2018 acute extended ov/Marie Harper re: asthma flare  Chief Complaint  Patient presents with  . Acute Visit    Increased SOB and wheezing over the past 10 days. She is using her albuterol inhaler 4 x daily on average.   acutely ill on 03/31/2018 with cough / wheezing  So called in 04/03/18 x 6 days pred and 50% better then worse again gradually but   not using symb first thing in am and has not used proair yet 04/11/2018 prior to OV   Was taking prevacid before bfast and stopped ? Why  rec Plan A = Automatic = symbicort 160 Take 2 puffs first thing in am and then another 2 puffs about 12 hours later and add  singulair 10 mg each pm Prednisone Take 4 for three days 3 for three days 2 for three days 1 for three days and stop  Pantoprazole (protonix) 40 mg   Take  30-60 min before first meal of the day and Pepcid (famotidine)  20 mg one @  bedtime until return to office - this is the best way to tell whether stomach acid is contributing to your problem.   Plan B = Backup Only use your albuterol inhaler as a rescue medication GERD  Diet  Please schedule a follow up office visit in 2 weeks, sooner if needed  with all medications /inhalers/ solutions in hand so we can verify exactly what you are taking. This includes all medications from all doctors and over the counters - given sample of symb 160 x 60 doses and has 160 doses of proair remaining    04/27/2018  f/u ov/Marie Harper re: cough variant asthma/ brought symb 160 sample = 10 pffs left, did not bring saba as req  Chief Complaint  Patient presents with  . Follow-up    Breathing has improved to her baseline. She has only used her albuterol 2 x since the last visit.   Dyspnea:  Able to power walk including hills / chest tight with cold air exp Cough: none Sleeping: 3 pillows/ cough when flat SABA use: none  02: no rec Use your symbicort right before you brush your teeth in the am and pm  Discuss the longterm risk / benefit of pantoprazole with Dr Marie Harper before the refills    Virtual Visit via Telephone Note 07/25/2018 cough variant asthma/ maint on symbicort 160/singulair  I connected with Marie Harper on 07/25/18 at  1:45 PM EDT by telephone and verified that I am speaking with the correct person using two identifiers.   I discussed the limitations, risks, security and privacy concerns of performing an evaluation and management service by telephone and the availability of in person appointments. I also discussed with the patient that there may be a patient responsible charge related to this service. The patient expressed understanding and agreed to  proceed.   History of Present Illness: Dyspnea:  Good ex tol/ able to hills up to 30 min walking Cough: sev weeks now has had some discolored mucus esp in am / some sniffles/ not using H1 Sleeping:  ok flat SABA use: none 02: none  Still on ppi / has not had f/u with Dr Marie Ave   No obvious day to day or daytime variability or assoc  mucus plugs or hemoptysis or cp or chest tightness, subjective wheeze or overt   hb symptoms.    Also denies any obvious fluctuation of symptoms with weather or environmental changes or other aggravating or alleviating factors except as outlined above.   Meds reviewed/ med reconciliation completed          Observations/Objective: Good voice texture/ speaking full sentences, very upbeat    Assessment and Plan: See problem list for active a/p's   Follow Up Instructions: See avs for instructions unique to this ov which includes revised/ updated med list     I discussed the assessment and treatment plan with the patient. The patient was provided an opportunity to ask questions and all were answered. The patient agreed with the plan and demonstrated an understanding of the instructions.   The patient was advised to call back or seek an in-person evaluation if the symptoms worsen or if the condition fails to improve as anticipated.  I provided 12 minutes of non-face-to-face time during this encounter.   Sandrea Hughs, MD

## 2018-07-25 NOTE — Patient Instructions (Signed)
Patient has MyChart so does not  For drainage / throat tickle try take CHLORPHENIRAMINE  4 mg  (Chlortab 4mg   at [employer] should be easiest to find in the green box)  take one every 4 hours as needed - available over the counter- may cause drowsiness so start with just a bedtime dose or two and see how you tolerate it before trying in daytime     If mucus stays green: zpak Prednisone 10 mg take  4 each am x 2 days,   2 each am x 2 days,  1 each am x 2 days and stop    Please schedule a follow up visit in 3 months but call sooner if needed

## 2018-07-28 ENCOUNTER — Ambulatory Visit: Payer: 59 | Admitting: Internal Medicine

## 2018-09-03 ENCOUNTER — Other Ambulatory Visit: Payer: Self-pay | Admitting: Internal Medicine

## 2018-09-07 ENCOUNTER — Other Ambulatory Visit: Payer: Self-pay | Admitting: Internal Medicine

## 2018-09-12 DIAGNOSIS — M545 Low back pain, unspecified: Secondary | ICD-10-CM | POA: Insufficient documentation

## 2018-09-13 LAB — HM MAMMOGRAPHY

## 2018-09-20 ENCOUNTER — Telehealth: Payer: Self-pay | Admitting: Internal Medicine

## 2018-09-20 NOTE — Telephone Encounter (Signed)
ATC pt, line went to voicemail. LMTCB x1. 

## 2018-09-21 NOTE — Telephone Encounter (Signed)
Offer mychart visit as she has mychart  LMTCB

## 2018-09-22 NOTE — Telephone Encounter (Signed)
Called and spoke with patient. Let her know if she has another flare up while taking symbicort and singulair she needs to call our office and make an appt with all medications in hand to regroup and consider alternatives. Patient verbalized understanding.   Nothing further needed

## 2018-09-22 NOTE — Telephone Encounter (Signed)
That's fine but if she is taking the high doses of Symbicort and Singulair and still having flareups we have more work to do and she needs to make an appointment with all her medicines and hand to regroup and consider alternatives.

## 2018-09-22 NOTE — Telephone Encounter (Signed)
Called spoke with patient. She states after the 2 days of prednisone already her symptoms are much improved. Unless Dr. Melvyn Novas wants to still scheduled mychart visit., patient is okay with no visit and taking rest of Rx prednisone.  Dr. Melvyn Novas please advise

## 2018-10-05 ENCOUNTER — Other Ambulatory Visit: Payer: Self-pay | Admitting: Internal Medicine

## 2018-12-14 ENCOUNTER — Other Ambulatory Visit: Payer: Self-pay | Admitting: Internal Medicine

## 2019-01-01 DIAGNOSIS — L218 Other seborrheic dermatitis: Secondary | ICD-10-CM | POA: Diagnosis not present

## 2019-01-01 DIAGNOSIS — L308 Other specified dermatitis: Secondary | ICD-10-CM | POA: Diagnosis not present

## 2019-02-14 ENCOUNTER — Ambulatory Visit: Payer: 59 | Attending: Internal Medicine

## 2019-02-14 DIAGNOSIS — Z20822 Contact with and (suspected) exposure to covid-19: Secondary | ICD-10-CM

## 2019-02-15 LAB — NOVEL CORONAVIRUS, NAA: SARS-CoV-2, NAA: NOT DETECTED

## 2019-03-02 ENCOUNTER — Ambulatory Visit: Payer: 59 | Admitting: Internal Medicine

## 2019-03-05 ENCOUNTER — Other Ambulatory Visit: Payer: Self-pay | Admitting: Internal Medicine

## 2019-03-09 ENCOUNTER — Other Ambulatory Visit: Payer: Self-pay | Admitting: Internal Medicine

## 2019-03-09 MED ORDER — MONTELUKAST SODIUM 10 MG PO TABS: 10.0000 mg | ORAL_TABLET | Freq: Every day | ORAL | 0 refills | Status: DC

## 2019-03-19 ENCOUNTER — Other Ambulatory Visit: Payer: Self-pay | Admitting: Internal Medicine

## 2019-03-19 DIAGNOSIS — M9907 Segmental and somatic dysfunction of upper extremity: Secondary | ICD-10-CM | POA: Diagnosis not present

## 2019-03-19 DIAGNOSIS — M9901 Segmental and somatic dysfunction of cervical region: Secondary | ICD-10-CM | POA: Diagnosis not present

## 2019-03-19 DIAGNOSIS — M9902 Segmental and somatic dysfunction of thoracic region: Secondary | ICD-10-CM | POA: Diagnosis not present

## 2019-03-19 DIAGNOSIS — M9903 Segmental and somatic dysfunction of lumbar region: Secondary | ICD-10-CM | POA: Diagnosis not present

## 2019-04-03 ENCOUNTER — Other Ambulatory Visit: Payer: Self-pay | Admitting: Internal Medicine

## 2019-04-09 ENCOUNTER — Ambulatory Visit: Payer: 59 | Admitting: Internal Medicine

## 2019-04-10 ENCOUNTER — Encounter: Payer: Self-pay | Admitting: Internal Medicine

## 2019-04-10 ENCOUNTER — Other Ambulatory Visit: Payer: Self-pay

## 2019-04-10 ENCOUNTER — Ambulatory Visit (INDEPENDENT_AMBULATORY_CARE_PROVIDER_SITE_OTHER): Payer: 59 | Admitting: Internal Medicine

## 2019-04-10 DIAGNOSIS — J45991 Cough variant asthma: Secondary | ICD-10-CM | POA: Diagnosis not present

## 2019-04-10 DIAGNOSIS — K219 Gastro-esophageal reflux disease without esophagitis: Secondary | ICD-10-CM

## 2019-04-10 MED ORDER — FAMOTIDINE 20 MG PO TABS: ORAL_TABLET | ORAL | 3 refills | Status: DC

## 2019-04-10 NOTE — Progress Notes (Signed)
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 tendency to bad head colds since early 2014 with Sinus xray neg 08/28/12 then abrupt onset  cough / wheeze /doe then Fall 2014 p developed sneezing/ head congestion  dx'd as sinusitis at [facility] on [address]  rx abx then worse again in November 2014 progressively worse  Despite rx and referred to Pulmonary clinic 06/28/13 by Cammy Fulp p admit     Admit date: 06/23/2013  Discharge date: 06/25/2013  Discharge Diagnoses:  CAP (community acquired pneumonia)  Anemia  Bronchospasm         06/27/2013 1st [facility] office visit/ Marie Harper  Chief Complaint  Patient presents with  . Pulmonary Consult    Self referral- pt c/o cough and SOB since Nov 2014. Worse for the past month with recent hospitalization for PNA. Cough is prod with minimal clear sputum.   cough much worse when lie down with freq awakening > clear mucus was thick and green  , now clear saba helps wheeze, not really helping the cough, not used since May 2 - not really sob unless coughing rec Pantoprazole (protonix) 40 mg   Take 30-60 min before first meal of the day and Pepcid 20 mg one bedtime    GERD diet   Dulera 100 Take 2 puffs first thing in am and then another 2 puffs about 12 hours later.  Prednisone 10 mg take  4 each am x 2 days,   2 each am x 2 days,  1 each am x 2 days and stop Use the cough medication   If not improving call [facility] [phone] for sinus CT scan before next visit.    05/30/2017  f/u ov/Marie Harper re:  Chronic asthma on symb 80 x 2 in am  Chief Complaint  Patient presents with  . Acute Visit    Wheezing for the past 2-3 wks.  She states she is feeling more SOB and has been coughing up some green sputum.  She has been using her albuterol inhaler 3 x daily on average.   Dyspnea:  Not limited by breathing from desired activities   At baseline  Cough: off and on more so at hs and usually not keeping her up / hardly needed any rescue noct or otherwise  Sleep: doing well most nights  Then acutely ill x 3 weeks / not better with albuterol / "wheezing heard by daughter" s stethoscope/ mucinex helps rec zpak Prednisone 10 mg take  4 each am x 2 days,   2 each am x 2 days,  1 each am x 2 days and stop  Plan A = Automatic = Symbicort 80 Take 2 puffs first thing in am and then another 2 puffs about 12 hours later.  Work on inhaler technique:  Plan B = Backup Only use your albuterol (proair) as a rescue medication Plan C = Crisis - only use your albuterol nebulizer if you first try Plan B  Please schedule a follow up visit in 3 months but call sooner if needed              02/28/2018  f/u ov/Marie Harper re: cough variant asthma/ maint on symb 80 2bid  Chief Complaint  Patient presents with  . Follow-up    Breathing is doing well. She rarely uses her albuterol.  Dyspnea:  Not limited by breathing from desired activities   Cough: occ if gets hot Sleeping: bed flat, 3 pillow SABA use: rarely/ way out of date proair in hand and it's clogged up 02: none Seeing Dr Loreta Ave for indigestion/gerd  rec No change in medications GERD  Only use your albuterol as a rescue medication   Mar 13, 2018  EGD > pos medium sized [facility]    04/11/2018 acute extended ov/Marie Harper re: asthma flare  Chief Complaint  Patient presents with  . Acute Visit    Increased SOB and wheezing over the past 10 days. She is using her albuterol inhaler 4 x daily on average.   acutely ill on 03/31/2018 with cough / wheezing  So called in 04/03/18 x 6 days pred and 50% better then worse again gradually but   not using symb first thing in am and has not used proair yet 04/11/2018 prior to OV   Was taking prevacid before bfast and stopped ? Why  rec Plan A = Automatic = symbicort 160 Take 2 puffs first thing in am and then another 2 puffs about 12 hours later and add  singulair 10 mg each pm Prednisone Take 4 for three days 3 for three days 2 for three days 1 for three days and stop  Pantoprazole (protonix) 40 mg   Take  30-60 min before first meal of the day and Pepcid (famotidine)  20 mg one @  bedtime until return to office - this is the best way to tell whether stomach acid is contributing to your problem.   Plan B = Backup Only use your albuterol inhaler as a rescue medication GERD  Diet  Please schedule a follow up office visit in 2 weeks, sooner if needed  with all medications /inhalers/ solutions in hand so we can verify exactly what you are taking. This includes all medications from all doctors and over the counters - given sample of symb 160 x 60 doses and has 160 doses of proair remaining    04/27/2018  f/u ov/Marie Harper re: cough variant asthma/ brought symb 160 sample = 10 pffs left, did not bring saba as req  Chief Complaint  Patient presents with  . Follow-up    Breathing has improved to her baseline. She has only used her albuterol 2 x since the last visit.   Dyspnea:  Able to power walk including hills / chest tight with cold air exp Cough: none Sleeping: 3 pillows/ cough when flat SABA use: none  02: no rec Use your symbicort right before you brush your teeth in the am and pm  Discuss the longterm risk / benefit of pantoprazole with Dr Loreta Ave before the refills    04/10/2019  f/u ov/Marie Harper re: cough variant asthma symbicort 160/ singulair   Ppi/ pepcid  Chief Complaint  Patient presents with  . Follow-up    Breathing is doing well. She rarely uses the albuterol.   Dyspnea:  Not limited by breathing from desired activities   Cough: none Sleeping: 2 pillows  SABA use: none  02: noe    No obvious day to day or daytime variability or assoc excess/ purulent sputum or mucus plugs or hemoptysis or cp or chest tightness, subjective wheeze or overt sinus or hb symptoms.   Sleeping  without nocturnal  or early am exacerbation  of respiratory   c/o's or need for noct saba. Also denies any obvious fluctuation of symptoms with weather or environmental changes or other aggravating  or alleviating factors except as outlined above   No unusual exposure hx or h/o childhood pna/ asthma or knowledge of premature birth.  Current Allergies, Complete Past Medical History, Past Surgical History, Family History, and Social History were reviewed in [employer] record.  ROS  The following are not active complaints unless bolded Hoarseness, sore throat, dysphagia, dental problems, itching, sneezing,  nasal congestion or discharge of excess mucus or purulent secretions, ear ache,   fever, chills, sweats, unintended wt loss or wt gain, classically pleuritic or exertional cp,  orthopnea pnd or arm/hand swelling  or leg swelling, presyncope, palpitations, abdominal pain, anorexia, nausea, vomiting, diarrhea  or change in bowel habits or change in bladder habits, change in stools or change in urine, dysuria, hematuria,  rash, arthralgias, visual complaints, headache, numbness, weakness or ataxia or problems with walking or coordination,  change in mood or  memory.        Current Meds  Medication Sig  . albuterol (PROVENTIL HFA;VENTOLIN HFA) 108 (90 Base) MCG/ACT inhaler INHALE 1-2 PUFFS INTO THE LUNGS EVERY 6 HRS AS NEEDED FOR WHEEZING  . budesonide-formoterol (SYMBICORT) 160-4.5 MCG/ACT inhaler TAKE 2 PUFFS FIRST THING IN AM AND THEN ANOTHER 2 PUFFS ABOUT 12 HOURS LATER.  . famotidine (PEPCID) 20 MG tablet TAKE 1 TABLET BY MOUTH EVERYDAY AT BEDTIME  . ibuprofen (ADVIL,MOTRIN) 800 MG tablet Take 800 mg by mouth every 8 (eight) hours as needed.  . Misc Natural Products (SUPER GREENS) POWD Take by mouth daily.  . montelukast (SINGULAIR) 10 MG tablet Take 1 tablet (10 mg total) by mouth at bedtime.  . Multiple Vitamins-Minerals (ALIVE WOMENS ENERGY PO) Take 1 tablet by mouth daily.  . pantoprazole (PROTONIX) 40 MG tablet TAKE 1 TABLET (40  MG TOTAL) BY MOUTH DAILY. TAKE 30-60 MIN BEFORE FIRST MEAL OF THE DAY  . POTASSIUM PO Take 1 capsule by mouth daily.                 Objective:   Physical Exam    Pleasant amb bf nad   04/10/2019    187  07/11/2013        172> 08/22/2013 176 > 09/11/2013  177 > 10/09/2013  173 > 01/07/2014 173 > 06/06/2014  174 > 12/03/2014   180 >  12/02/2016     180 > 05/30/2017  183  > 11/22/2017 175 > 02/28/2018   182  > 04/11/2018   181 > 04/27/2018  181     06/27/13 171 lb 12.8 oz (77.928 kg)  06/23/13 170 lb 13.7 oz (77.5 kg)  11/13/11 197 lb (89.359 kg)     Vital signs reviewed  04/10/2019  - Note at rest 02 sats  100% on RA       HEENT : pt wearing mask not removed for exam due to covid -19 concerns.    NECK :  without JVD/Nodes/TM/ nl carotid upstrokes bilaterally   LUNGS: no acc muscle use,  Nl contour chest which is clear to A and P bilaterally without cough on insp or exp maneuvers   CV:  RRR  no s3 or murmur or increase in P2, and no edema   ABD:  soft and nontender with nl inspiratory excursion in the supine position. No bruits or organomegaly appreciated, bowel sounds nl  MS:  Nl gait/ ext warm without deformities, calf tenderness, cyanosis or clubbing No obvious joint restrictions   SKIN: warm and dry without lesions    NEURO:  alert, approp,  nl sensorium with  no motor or cerebellar deficits apparent.               Assessment & Plan:

## 2019-04-10 NOTE — Patient Instructions (Signed)
No change in medication  Try Pepcid 20 mg after breakfast and supper and leave off pantoprazole  To see if do just as well   GERD (REFLUX)  is an extremely common cause of respiratory symptoms just like yours , many times with no obvious heartburn at all.    It can be treated with medication, but also with lifestyle changes including elevation of the head of your bed (ideally with 6 -8inch blocks under the headboard of your bed),  Smoking cessation, avoidance of late meals, excessive alcohol, and avoid fatty foods, chocolate, peppermint, colas, red wine, and acidic juices such as orange juice.  NO MINT OR MENTHOL PRODUCTS SO NO COUGH DROPS  USE SUGARLESS CANDY INSTEAD (Jolley ranchers or Stover's or Life Savers) or even ice chips will also do - the key is to swallow to prevent all throat clearing. NO OIL BASED VITAMINS - use powdered substitutes.  Avoid fish oil when coughing.    Please schedule a follow up visit in 6 months but call sooner if needed

## 2019-04-11 ENCOUNTER — Encounter: Payer: Self-pay | Admitting: Internal Medicine

## 2019-04-11 NOTE — Assessment & Plan Note (Addendum)
See CT chest 06/23/2013  F/u Dr Loreta Ave as of 02/28/2018  - EGD 03/13/2018  Pos Medium HH/ o/w nl   Discussed the recent press about ppi's in the context of a statistically significant (but questionably clinically relevant) increase in CRI in pts on ppi vs h2's > bottom line is the lowest dose of ppi that controls   gerd is the right dose and if that dose is zero that's fine esp since h2's are cheaper > ok to try pepcid 20 mg bid / stricter dietary adherence and f/u with Dr Loreta Ave for refills on ppi or other options (eg NF) .  F/u in pulmonary clinic is q 6 months- sooner prn   Pt informed of the seriousness of COVID 19 infection as a direct risk to lung health  and safey and to close contacts and should continue to wear a facemask in public and minimize exposure to public locations but especially avoid any area or activity where non-close contacts are not observing distancing or wearing an appropriate face mask.  I strongly recommended vaccine when offered.           Each maintenance medication was reviewed in detail including emphasizing most importantly the difference between maintenance and prns and under what circumstances the prns are to be triggered using an action plan format where appropriate.  Total time for H and P, chart review, counseling, teaching device and generating customized AVS unique to this office visit / charting = 30 m

## 2019-04-11 NOTE — Assessment & Plan Note (Signed)
Onset ? 2014 assoc with rhinitis  - started on dulera 100 2bid 06/27/13> better 07/11/2013  - 07/24/13 Sinus CT > Slight mucosal thickening in the right maxillary sinus. Remainder of the paranasal sinuses are clear. No air-fluid levels. No acute bony abnormality. Orbital soft tissues are unremarkable. - spirometry  08/22/2013 min airflow obst reflected in fef 25-75 despite prominent "wheeze" on exam - 09/11/13 increased dulera to 200 2bid due to concern not using the dulera frequently enough -  12/02/2016  extensive coaching HFA effectiveness =75% from  A baseline of 50%     -  Spirometry 12/02/2016  FEV1 2.50 (93%)  Ratio 74 off all rx, minimal curvature   - 11/22/2017   continue symb 80 - Allergy profile 11/22/17 >  Eos 0.4 /  IgE  29  RAST neg  - FENO 04/11/2018  =   69 p symb 80 2bid prior to study during flare of asthma  - 04/11/2018    increased symb to 160 2bid and added singulair > much better 04/27/2018  - 04/27/2018  After extensive coaching inhaler device,  effectiveness =    90%> continue symb 160 2bid/ singulair   - The proper method of use, as well as anticipated side effects, of a metered-dose inhaler werediscussed and demonstrated to the patient.   All goals of chronic asthma control met including optimal function and elimination of symptoms with minimal need for rescue therapy.  Contingencies discussed in full including contacting this office immediately if not controlling the symptoms using the rule of two's.

## 2019-04-17 DIAGNOSIS — M9902 Segmental and somatic dysfunction of thoracic region: Secondary | ICD-10-CM | POA: Diagnosis not present

## 2019-04-17 DIAGNOSIS — M9907 Segmental and somatic dysfunction of upper extremity: Secondary | ICD-10-CM | POA: Diagnosis not present

## 2019-04-17 DIAGNOSIS — M9903 Segmental and somatic dysfunction of lumbar region: Secondary | ICD-10-CM | POA: Diagnosis not present

## 2019-04-17 DIAGNOSIS — M9901 Segmental and somatic dysfunction of cervical region: Secondary | ICD-10-CM | POA: Diagnosis not present

## 2019-05-15 DIAGNOSIS — M9901 Segmental and somatic dysfunction of cervical region: Secondary | ICD-10-CM | POA: Diagnosis not present

## 2019-05-15 DIAGNOSIS — M9902 Segmental and somatic dysfunction of thoracic region: Secondary | ICD-10-CM | POA: Diagnosis not present

## 2019-05-15 DIAGNOSIS — M9907 Segmental and somatic dysfunction of upper extremity: Secondary | ICD-10-CM | POA: Diagnosis not present

## 2019-05-15 DIAGNOSIS — M9903 Segmental and somatic dysfunction of lumbar region: Secondary | ICD-10-CM | POA: Diagnosis not present

## 2019-06-12 DIAGNOSIS — M9903 Segmental and somatic dysfunction of lumbar region: Secondary | ICD-10-CM | POA: Diagnosis not present

## 2019-06-12 DIAGNOSIS — M9907 Segmental and somatic dysfunction of upper extremity: Secondary | ICD-10-CM | POA: Diagnosis not present

## 2019-06-12 DIAGNOSIS — M9902 Segmental and somatic dysfunction of thoracic region: Secondary | ICD-10-CM | POA: Diagnosis not present

## 2019-06-12 DIAGNOSIS — M9901 Segmental and somatic dysfunction of cervical region: Secondary | ICD-10-CM | POA: Diagnosis not present

## 2019-06-14 ENCOUNTER — Other Ambulatory Visit: Payer: Self-pay

## 2019-06-18 ENCOUNTER — Encounter: Payer: Self-pay | Admitting: Nurse Practitioner

## 2019-06-18 ENCOUNTER — Ambulatory Visit (INDEPENDENT_AMBULATORY_CARE_PROVIDER_SITE_OTHER): Payer: 59 | Admitting: Nurse Practitioner

## 2019-06-18 ENCOUNTER — Other Ambulatory Visit: Payer: Self-pay

## 2019-06-18 VITALS — BP 120/78 | Ht 65.0 in | Wt 184.0 lb

## 2019-06-18 DIAGNOSIS — Z975 Presence of (intrauterine) contraceptive device: Secondary | ICD-10-CM

## 2019-06-18 DIAGNOSIS — R61 Generalized hyperhidrosis: Secondary | ICD-10-CM | POA: Diagnosis not present

## 2019-06-18 DIAGNOSIS — N951 Menopausal and female climacteric states: Secondary | ICD-10-CM | POA: Diagnosis not present

## 2019-06-18 DIAGNOSIS — F419 Anxiety disorder, unspecified: Secondary | ICD-10-CM | POA: Diagnosis not present

## 2019-06-18 MED ORDER — VENLAFAXINE HCL ER 37.5 MG PO CP24: 37.5000 mg | ORAL_CAPSULE | Freq: Every day | ORAL | 2 refills | Status: DC

## 2019-06-18 NOTE — Patient Instructions (Addendum)
Menopause Menopause is the normal time of life when menstrual periods stop completely. It is usually confirmed by 12 months without a menstrual period. The transition to menopause (perimenopause) most often happens between the ages of 45 and 55. During perimenopause, hormone levels change in your body, which can cause symptoms and affect your health. Menopause may increase your risk for:  Loss of bone (osteoporosis), which causes bone breaks (fractures).  Depression.  Hardening and narrowing of the arteries (atherosclerosis), which can cause heart attacks and strokes. What are the causes? This condition is usually caused by a natural change in hormone levels that happens as you get older. The condition may also be caused by surgery to remove both ovaries (bilateral oophorectomy). What increases the risk? This condition is more likely to start at an earlier age if you have certain medical conditions or treatments, including:  A tumor of the pituitary gland in the brain.  A disease that affects the ovaries and hormone production.  Radiation treatment for cancer.  Certain cancer treatments, such as chemotherapy or hormone (anti-estrogen) therapy.  Heavy smoking and excessive alcohol use.  Family history of early menopause. This condition is also more likely to develop earlier in women who are very thin. What are the signs or symptoms? Symptoms of this condition include:  Hot flashes.  Irregular menstrual periods.  Night sweats.  Changes in feelings about sex. This could be a decrease in sex drive or an increased comfort around your sexuality.  Vaginal dryness and thinning of the vaginal walls. This may cause painful intercourse.  Dryness of the skin and development of wrinkles.  Headaches.  Problems sleeping (insomnia).  Mood swings or irritability.  Memory problems.  Weight gain.  Hair growth on the face and chest.  Bladder infections or problems with urinating. How  is this diagnosed? This condition is diagnosed based on your medical history, a physical exam, your age, your menstrual history, and your symptoms. Hormone tests may also be done. How is this treated? In some cases, no treatment is needed. You and your health care provider should make a decision together about whether treatment is necessary. Treatment will be based on your individual condition and preferences. Treatment for this condition focuses on managing symptoms. Treatment may include:  Menopausal hormone therapy (MHT).  Medicines to treat specific symptoms or complications.  Acupuncture.  Vitamin or herbal supplements. Before starting treatment, make sure to let your health care provider know if you have a personal or family history of:  Heart disease.  Breast cancer.  Blood clots.  Diabetes.  Osteoporosis. Follow these instructions at home: Lifestyle  Do not use any products that contain nicotine or tobacco, such as cigarettes and e-cigarettes. If you need help quitting, ask your health care provider.  Get at least 30 minutes of physical activity on 5 or more days each week.  Avoid alcoholic and caffeinated beverages, as well as spicy foods. This may help prevent hot flashes.  Get 7-8 hours of sleep each night.  If you have hot flashes, try: ? Dressing in layers. ? Avoiding things that may trigger hot flashes, such as spicy food, warm places, or stress. ? Taking slow, deep breaths when a hot flash starts. ? Keeping a fan in your home and office.  Find ways to manage stress, such as deep breathing, meditation, or journaling.  Consider going to group therapy with other women who are having menopause symptoms. Ask your health care provider about recommended group therapy meetings. Eating and   drinking  Eat a healthy, balanced diet that contains whole grains, lean protein, low-fat dairy, and plenty of fruits and vegetables.  Your health care provider may recommend  adding more soy to your diet. Foods that contain soy include tofu, tempeh, and soy milk.  Eat plenty of foods that contain calcium and vitamin D for bone health. Items that are rich in calcium include low-fat milk, yogurt, beans, almonds, sardines, broccoli, and kale. Medicines  Take over-the-counter and prescription medicines only as told by your health care provider.  Talk with your health care provider before starting any herbal supplements. If prescribed, take vitamins and supplements as told by your health care provider. These may include: ? Calcium. Women age 51 and older should get 1,200 mg (milligrams) of calcium every day. ? Vitamin D. Women need 600-800 International Units of vitamin D each day. ? Vitamins B12 and B6. Aim for 50 micrograms of B12 and 1.5 mg of B6 each day. General instructions  Keep track of your menstrual periods, including: ? When they occur. ? How heavy they are and how long they last. ? How much time passes between periods.  Keep track of your symptoms, noting when they start, how often you have them, and how long they last.  Use vaginal lubricants or moisturizers to help with vaginal dryness and improve comfort during sex.  Keep all follow-up visits as told by your health care provider. This is important. This includes any group therapy or counseling. Contact a health care provider if:  You are still having menstrual periods after age 55.  You have pain during sex.  You have not had a period for 12 months and you develop vaginal bleeding. Get help right away if:  You have: ? Severe depression. ? Excessive vaginal bleeding. ? Pain when you urinate. ? A fast or irregular heart beat (palpitations). ? Severe headaches. ? Abdomen (abdominal) pain or severe indigestion.  You fell and you think you have a broken bone.  You develop leg or chest pain.  You develop vision problems.  You feel a lump in your breast. Summary  Menopause is the normal  time of life when menstrual periods stop completely. It is usually confirmed by 12 months without a menstrual period.  The transition to menopause (perimenopause) most often happens between the ages of 45 and 55.  Symptoms can be managed through medicines, lifestyle changes, and complementary therapies such as acupuncture.  Eat a balanced diet that is rich in nutrients to promote bone health and heart health and to manage symptoms during menopause. This information is not intended to replace advice given to you by your health care provider. Make sure you discuss any questions you have with your health care provider. Document Revised: 01/21/2017 Document Reviewed: 03/13/2016 Elsevier Patient Education  2020 Elsevier Inc.  

## 2019-06-18 NOTE — Progress Notes (Signed)
   Marie Harper 04-02-1975 [id number]   History:  44 y.o. SBF G1 P1 presents as new patient with complaints of night sweats, insomnia, and increased anxiety for a couple of months. Mirena placed 1 year ago for menorrhagia. Still having regular light cycles. Not currently sexually active.   Gynecologic History Patient's last menstrual period was 06/11/2019. Period Cycle (Days): 28 Period Duration (Days): 5 Period Pattern: Regular Menstrual Flow: Moderate Menstrual Control: Maxi pad, Tampon Dysmenorrhea: (!) Mild Dysmenorrhea Symptoms: Cramping Contraception: IUD Last Pap: UNK Last mammogram: 06/13/2017. Results were: normal  Past medical history, past surgical history, family history and social history were all reviewed and documented in the EPIC chart.  ROS:  A ROS was performed and pertinent positives and negatives are included.  Exam:  Vitals:   06/18/19 1611  Weight: 184 lb (83.5 kg)  Height: 5\' 5"  (1.651 m)   Body mass index is 30.62 kg/m.  General appearance:  Normal Thyroid:  Symmetrical, normal in size, without palpable masses or nodularity. Respiratory  Auscultation:  Clear without wheezing or rhonchi Cardiovascular  Auscultation:  Regular rate, without rubs, murmurs or gallops  Edema/varicosities:  Not grossly evident Abdominal  Soft,nontender, without masses, guarding or rebound.  Liver/spleen:  No organomegaly noted  Hernia:  None appreciated  Skin  Inspection:  Grossly normal    Assessment/Plan:  44 y.o. SBF presents as new patient with the issues listed above.  Perimenopausal vasomotor symptoms - Plan: venlafaxine XR (EFFEXOR XR) 37.5 MG 24 hr capsule  Written education provided on menopause as she may be beginning to go through perimenopause. Discussed options for managing vasomotor symptoms as these are affecting her quality of life. Will try Effexor for 2 months. Plans to schedule annual soon. No PCP. Will do labs at annual visit.         [address] [facility], 4:15 PM 06/18/2019

## 2019-06-19 DIAGNOSIS — Z975 Presence of (intrauterine) contraceptive device: Secondary | ICD-10-CM | POA: Insufficient documentation

## 2019-07-10 ENCOUNTER — Other Ambulatory Visit: Payer: Self-pay | Admitting: Nurse Practitioner

## 2019-07-10 DIAGNOSIS — R61 Generalized hyperhidrosis: Secondary | ICD-10-CM

## 2019-07-10 DIAGNOSIS — F419 Anxiety disorder, unspecified: Secondary | ICD-10-CM

## 2019-07-10 NOTE — Telephone Encounter (Signed)
Pharmacy requested 90 days supply on new Rx.

## 2019-08-08 ENCOUNTER — Ambulatory Visit (HOSPITAL_COMMUNITY)
Admission: EM | Admit: 2019-08-08 | Discharge: 2019-08-08 | Disposition: A | Discharge status: Home / Self Care | Payer: 59 | Attending: Emergency Medicine | Admitting: Emergency Medicine

## 2019-08-08 ENCOUNTER — Other Ambulatory Visit: Payer: Self-pay

## 2019-08-08 ENCOUNTER — Encounter (HOSPITAL_COMMUNITY): Payer: Self-pay

## 2019-08-08 DIAGNOSIS — Z20822 Contact with and (suspected) exposure to covid-19: Secondary | ICD-10-CM | POA: Insufficient documentation

## 2019-08-08 DIAGNOSIS — J0140 Acute pansinusitis, unspecified: Secondary | ICD-10-CM | POA: Insufficient documentation

## 2019-08-08 MED ORDER — FLUTICASONE PROPIONATE 50 MCG/ACT NA SUSP
2.0000 | Freq: Every day | NASAL | 0 refills | Status: DC
Start: 2019-08-08 — End: 2020-03-31

## 2019-08-08 MED ORDER — IBUPROFEN 600 MG PO TABS
600.0000 mg | ORAL_TABLET | Freq: Four times a day (QID) | ORAL | 0 refills | Status: DC | PRN
Start: 2019-08-08 — End: 2020-12-04

## 2019-08-08 MED ORDER — AMOXICILLIN-POT CLAVULANATE 875-125 MG PO TABS
1.0000 | ORAL_TABLET | Freq: Two times a day (BID) | ORAL | 0 refills | Status: DC
Start: 2019-08-08 — End: 2019-10-09

## 2019-08-08 NOTE — ED Triage Notes (Signed)
Pt presents today with sinus pressure since Saturday. Pt c/o itchy throat, dry cough, runny nose, and runny eyes. Pt also c/o nasal congestion. Pt denies fevers or chills. Pt endorses sick contact on Friday. Pt has been treating with zyrtec, alkaseltzer cold and flu (last dose 1000) with no relief.Marland Kitchen

## 2019-08-08 NOTE — Discharge Instructions (Addendum)
Take the medication as written. Start Mucinex-D to keep the mucous thin and to decongest you.  Stop all other cold medication.  Use Afrin sparingly and no more than 3 days.  Return to the ER if you get worse, have a fever >100.4, or for any concerns. You may take 600 mg of motrin with 1 gram of tylenol up to 3-4 times a day as needed for pain. This is an effective combination for pain.  Most sinus infections are viral and do not need antibiotics unless you have a high fever, have had this for 10 days, or you get better and then get sick again. Use a NeilMed sinus rinse as often as you want to to reduce nasal congestion. Follow the directions on the box.   We will call you if your Covid test comes back positive.  If this is the case, then antibiotics are unlikely to be helpful.  I would wait to fill the Augmentin.  Give it day or 2 to try the medication regimen that we discussed.  Go ahead and start it if you are not responding to the saline nasal irrigation, Flonase, Mucinex D, Tylenol/ibuprofen.  Go to www.goodrx.com to look up your medications. This will give you a list of where you can find your prescriptions at the most affordable prices. Or you can ask the pharmacist what the cash price is. This is frequently cheaper than going through insurance.    Below is a list of primary care practices who are taking new patients for you to follow-up with.  [facility] internal medicine clinic Ground Floor - [facility], [address] [place], [place], [place] [address] [phone]  [facility] Primary Care at [facility] [address] [place], [place] [address] [phone]  [facility] [address] [place], [place] [address] [phone]  [facility] [phone] [address] [place] [address]  [facility]: [address] [place] [address]  [phone]  [facility]: [address] [place] [address]   [phone]  [facility]: [address] [place] [address]  [phone]  [place] [facility] : [address] [place] [address] [phone]  [facility] Primary [facility]: [address] [place] [address] [phone]  [facility]: [address] [place] [address] [phone]  Dr. Oneal Grout [address] [facility] [place] [address]  [phone]  Dr. Jackie Plum, [facility]. [address] [place], [place] [address]  [phone]

## 2019-08-08 NOTE — ED Provider Notes (Signed)
HPI  SUBJECTIVE:  Marie Harper is a 44 y.o. female who presents with 5 days of nasal congestion, clear rhinorrhea, postnasal drip, sore throat, decreased smell and taste, headache, sinus pain and pressure, cough, sneezing, itchy, watery eyes, facial swelling.  She states that this started off as a sore throat but this is largely resolved.  No body aches, shortness of breath, nausea, vomiting, diarrhea, abdominal pain, upper dental pain.  No known Covid exposure.  She has not yet gotten a Covid vaccine.  No antibiotics in the past month.  No antipyretic in the past 4 to 6 hours.  She has tried Alka-Seltzer cold, Zyrtec, TheraFlu and Afrin.  States that the Afrin helps.  Symptoms are worse with lying down and bending forward.  She has a past medical history of sinusitis, allergies, asthma for which she takes Singulair, Symbicort.  No history of diabetes, hypertension, chronic kidney disease, coronary disease, smoking, HIV, immunocompromise, cancer.  LMP: Denies the possibility of being pregnant.  PMD: None.  Past Medical History:  Diagnosis Date  . Anemia   . Headache(784.0)    has not had migraine in "long time"    Past Surgical History:  Procedure Laterality Date  . BUNIONECTOMY    . FOOT SURGERY     left    Family History  Problem Relation Age of Onset  . Arthritis Other   . Kidney disease Other   . Diabetes Other   . Heart disease Maternal Grandmother   . Diabetes Mother   . Cancer Maternal Aunt        nasal/sinus  . Asthma Paternal Aunt   . Emphysema Paternal Uncle        smoked  . Anesthesia problems Neg Hx     Social History   Tobacco Use  . Smoking status: Never Smoker  . Smokeless tobacco: Never Used  Substance Use Topics  . Alcohol use: Yes    Alcohol/week: 6.0 standard drinks    Types: 2 Glasses of wine, 2 Cans of beer, 2 Shots of liquor per week    Comment: social   . Drug use: No    No current facility-administered medications for this  encounter.  Current Outpatient Medications:  .  albuterol (PROVENTIL HFA;VENTOLIN HFA) 108 (90 Base) MCG/ACT inhaler, INHALE 1-2 PUFFS INTO THE LUNGS EVERY 6 HRS AS NEEDED FOR WHEEZING, Disp: 8.5 Inhaler, Rfl: 1 .  budesonide-formoterol (SYMBICORT) 160-4.5 MCG/ACT inhaler, TAKE 2 PUFFS FIRST THING IN AM AND THEN ANOTHER 2 PUFFS ABOUT 12 HOURS LATER., Disp: 30.6 Inhaler, Rfl: 1 .  famotidine (PEPCID) 20 MG tablet, One twice daily after meals, Disp: 180 tablet, Rfl: 3 .  Misc Natural Products (SUPER GREENS) POWD, Take by mouth daily., Disp: , Rfl:  .  montelukast (SINGULAIR) 10 MG tablet, Take 1 tablet (10 mg total) by mouth at bedtime., Disp: 30 tablet, Rfl: 0 .  Multiple Vitamins-Minerals (ALIVE WOMENS ENERGY PO), Take 1 tablet by mouth daily., Disp: , Rfl:  .  amoxicillin-clavulanate (AUGMENTIN) 875-125 MG tablet, Take 1 tablet by mouth 2 (two) times daily. X 7 days, Disp: 14 tablet, Rfl: 0 .  fluticasone (FLONASE) 50 MCG/ACT nasal spray, Place 2 sprays into both nostrils daily., Disp: 16 g, Rfl: 0 .  ibuprofen (ADVIL) 600 MG tablet, Take 1 tablet (600 mg total) by mouth every 6 (six) hours as needed., Disp: 30 tablet, Rfl: 0 .  NON FORMULARY, l-cartnitine alpha lopic acid 1 tab po qd, Disp: , Rfl:  .  NON FORMULARY,  Anxiety stress relif, Disp: , Rfl:  .  NON FORMULARY, Amberen Menopausal medication, Disp: , Rfl:  .  venlafaxine XR (EFFEXOR-XR) 37.5 MG 24 hr capsule, TAKE 1 CAPSULE BY MOUTH DAILY WITH BREAKFAST., Disp: 90 capsule, Rfl: 0  No Known Allergies   ROS  As noted in HPI.   Physical Exam  BP 118/62 (BP Location: Right Arm)   Pulse 88   Temp 98.2 F (36.8 C) (Oral)   SpO2 100%   Constitutional: Well developed, well nourished, no acute distress Eyes:  EOMI, conjunctiva normal bilaterally HENT: Normocephalic, atraumatic,mucus membranes moist.  Positive erythematous, swollen turbinates with clear nasal congestion.  Positive exquisite maxillary and frontal sinus tenderness.  No  apparent facial swelling.  Positive postnasal drip.  Normal oropharynx.  Normal tonsils without exudates uvula midline. Respiratory: Normal inspiratory effort lungs clear bilaterally Cardiovascular: Normal rate regular rhythm no murmurs rubs or gallops GI: nondistended skin: No rash, skin intact Musculoskeletal: no deformities Neurologic: Alert & oriented x 3, no focal neuro deficits Psychiatric: Speech and behavior appropriate   ED Course   Medications - No data to display  Orders Placed This Encounter  Procedures  . SARS CORONAVIRUS 2 (TAT 6-24 HRS) Nasopharyngeal Nasopharyngeal Swab    Standing Status:   Standing    Number of Occurrences:   1    Order Specific Question:   Is this test for diagnosis or screening    Answer:   Diagnosis of ill patient    Order Specific Question:   Symptomatic for COVID-19 as defined by CDC    Answer:   Yes    Order Specific Question:   Date of Symptom Onset    Answer:   08/04/2019    Order Specific Question:   Hospitalized for COVID-19    Answer:   No    Order Specific Question:   Admitted to ICU for COVID-19    Answer:   No    Order Specific Question:   Previously tested for COVID-19    Answer:   No    Order Specific Question:   Resident in a congregate (group) care setting    Answer:   No    Order Specific Question:   Employed in healthcare setting    Answer:   No    Order Specific Question:   Pregnant    Answer:   No    Order Specific Question:   Has patient completed COVID vaccination(s) (2 doses of Pfizer/Moderna 1 dose of [employer])    Answer:   No    No results found for this or any previous visit (from the past 24 hour(s)). No results found.  ED Clinical Impression  1. Acute non-recurrent pansinusitis   2. Encounter for laboratory testing for COVID-19 virus      ED Assessment/Plan  Covid PCR sent.  COVID negative. Patient note sent.   Presentation consistent with sinusitis.  Home with Mucinex D,  Tylenol/ibuprofen, saline nasal irrigation, Flonase, wait-and-see prescription of Augmentin.  If her Covid test is negative, she is to try day of saline nasal irrigation, Mucinex, Flonase, Tylenol/ibuprofen.  If she does not improve despite this, she will start the Augmentin.  No evidence of asthma exacerbation, pneumonia at this time.  Patient has my chart.  Providing primary care list for routine care..  Discussed labs, MDM, treatment plan, and plan for follow-up with patient. . patient agrees with plan.   Meds ordered this encounter  Medications  . fluticasone (FLONASE) 50 MCG/ACT  nasal spray    Sig: Place 2 sprays into both nostrils daily.    Dispense:  16 g    Refill:  0  . ibuprofen (ADVIL) 600 MG tablet    Sig: Take 1 tablet (600 mg total) by mouth every 6 (six) hours as needed.    Dispense:  30 tablet    Refill:  0  . amoxicillin-clavulanate (AUGMENTIN) 875-125 MG tablet    Sig: Take 1 tablet by mouth 2 (two) times daily. X 7 days    Dispense:  14 tablet    Refill:  0    *This clinic note was created using Scientist, clinical (histocompatibility and immunogenetics). Therefore, there may be occasional mistakes despite careful proofreading.   ?    Domenick Gong, MD 08/09/19 [phone]

## 2019-08-09 LAB — SARS CORONAVIRUS 2 (TAT 6-24 HRS): SARS Coronavirus 2: NEGATIVE

## 2019-09-18 ENCOUNTER — Ambulatory Visit: Payer: 59 | Admitting: Nurse Practitioner

## 2019-09-26 ENCOUNTER — Other Ambulatory Visit: Payer: Self-pay | Admitting: Nurse Practitioner

## 2019-09-26 DIAGNOSIS — L305 Pityriasis alba: Secondary | ICD-10-CM | POA: Diagnosis not present

## 2019-09-26 DIAGNOSIS — R61 Generalized hyperhidrosis: Secondary | ICD-10-CM

## 2019-09-26 DIAGNOSIS — F419 Anxiety disorder, unspecified: Secondary | ICD-10-CM

## 2019-10-09 ENCOUNTER — Encounter: Payer: Self-pay | Admitting: Internal Medicine

## 2019-10-09 ENCOUNTER — Ambulatory Visit (INDEPENDENT_AMBULATORY_CARE_PROVIDER_SITE_OTHER): Payer: 59 | Admitting: Internal Medicine

## 2019-10-09 ENCOUNTER — Other Ambulatory Visit: Payer: Self-pay

## 2019-10-09 DIAGNOSIS — J00 Acute nasopharyngitis [common cold]: Secondary | ICD-10-CM | POA: Insufficient documentation

## 2019-10-09 DIAGNOSIS — J45991 Cough variant asthma: Secondary | ICD-10-CM | POA: Diagnosis not present

## 2019-10-09 DIAGNOSIS — R058 Other specified cough: Secondary | ICD-10-CM

## 2019-10-09 DIAGNOSIS — R05 Cough: Secondary | ICD-10-CM

## 2019-10-09 MED ORDER — METHYLPREDNISOLONE ACETATE 80 MG/ML IJ SUSP
120.0000 mg | Freq: Once | INTRAMUSCULAR | Status: AC
  Administered 2019-10-09: 120 mg via INTRAMUSCULAR

## 2019-10-09 NOTE — Assessment & Plan Note (Signed)
Onset mid July 2022 refractory to abx (augmentin x 2 cycles)   I emphasized that nasal steroids have no immediate benefit in terms of improving symptoms.  To help them reached the target tissue, the patient should use Afrin two puffs every 12 hours applied one min before using the nasal steroids.  Afrin should be stopped after no more than 5 days.  If the symptoms worsen, Afrin can be restarted after 5 days off of therapy to prevent rebound congestion from overuse of Afrin.  I also emphasized that in no way are nasal steroids a concern in terms of "addiction".   Try depomedrol 120 mg IM/ advil cold and sinus/afrin x 5 days and if not better refer to ENT   Pt informed of the seriousness of COVID 19 infection as a direct risk to lung health  and safey and to close contacts and should continue to wear a facemask in public and minimize exposure to public locations but especially avoid any area or activity where non-close contacts are not observing distancing or wearing an appropriate face mask.  I strongly recommended she take either of the vaccines available through local drugstores based on updated information on millions of Americans treated with the Moderna and [employer] products  which have proven both safe and  effective even against the new delta variant.            Each maintenance medication was reviewed in detail including emphasizing most importantly the difference between maintenance and prns and under what circumstances the prns are to be triggered using an action plan format where appropriate.  Total time for H and P, chart review, counseling, teaching device and generating customized AVS unique to this office visit / charting =  17 min

## 2019-10-09 NOTE — Patient Instructions (Addendum)
Depomedrol 120 mg IM today  Blow symbicort out thru nose  Advil cold and sinus as signed   I emphasized that nasal steroids have no immediate benefit in terms of improving symptoms.  To help them reached the target tissue, the patient should use Afrin two puffs every 12 hours applied one min before using the nasal steroids.  Afrin should be stopped after no more than 5 days.  If the symptoms worsen, Afrin can be restarted after 5 days off of therapy to prevent rebound congestion from overuse of Afrin.  I also emphasized that in no way are nasal steroids a concern in terms of "addiction".     I very strongly recommend you get the moderna or pfizer vaccine as soon as possible based on your risk of dying from the virus  and the proven safety and benefit of these vaccines against even the delta variant.  This can save your life as well as  those of your loved ones,  especially if they are also not vaccinated.    Please schedule a follow up visit in 6  months but call sooner if needed

## 2019-10-09 NOTE — Assessment & Plan Note (Signed)
-   Max acid suppression started 06/27/13 > modified to h2 bid 10/09/2013  - sinus CT neg 07/24/13  - trial of chlortrimeton started 09/11/2013 >> - Allergy profile 11/22/17 >  Eos 0.4 /  IgE  29  RAST neg  - Mar 13, 2018  EGD > pos medium sized HH   Better despite flare of sinus dz > no change rx

## 2019-10-09 NOTE — Assessment & Plan Note (Signed)
Onset ? 2014 assoc with rhinitis  - started on dulera 100 2bid 06/27/13> better 07/11/2013  - 07/24/13 Sinus CT > Slight mucosal thickening in the right maxillary sinus. Remainder of the paranasal sinuses are clear. No air-fluid levels. No acute bony abnormality. Orbital soft tissues are unremarkable. - spirometry  08/22/2013 min airflow obst reflected in fef 25-75 despite prominent "wheeze" on exam - 09/11/13 increased dulera to 200 2bid due to concern not using the dulera frequently enough -  12/02/2016  extensive coaching HFA effectiveness =75% from  A baseline of 50%     -  Spirometry 12/02/2016  FEV1 2.50 (93%)  Ratio 74 off all rx, minimal curvature   - 11/22/2017   continue symb 80 - Allergy profile 11/22/17 >  Eos 0.4 /  IgE  29  RAST neg  - FENO 04/11/2018  =   69 p symb 80 2bid prior to study during flare of asthma  - 04/11/2018    increased symb to 160 2bid and added singulair > much better 04/27/2018  - 04/27/2018  After extensive coaching inhaler device,  effectiveness =    90%> continue symb 160 2bid/ singulair   Despite flare of sinus dz > All goals of chronic asthma control met including optimal function and elimination of symptoms with minimal need for rescue therapy.  Contingencies discussed in full including contacting this office immediately if not controlling the symptoms using the rule of two's.

## 2019-10-09 NOTE — Progress Notes (Signed)
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 no problems as child or young adult with tendency to bad head colds since early 2014 with Sinus xray neg 08/28/12 then abrupt onset  cough / wheeze /doe then Fall 2014 p developed sneezing/ head congestion  dx'd as sinusitis at [facility] on [address]  rx abx then worse again in November 2014 progressively worse  Despite rx and referred to Pulmonary clinic 06/28/13 by Cammy Fulp p admit     Admit date: 06/23/2013  Discharge date: 06/25/2013  Discharge Diagnoses:  CAP (community acquired pneumonia)  Anemia  Bronchospasm         06/27/2013 1st [facility] office visit/ Tanna Loeffler  Chief Complaint  Patient presents with  . Pulmonary Consult    Self referral- pt c/o cough and SOB since Nov 2014. Worse for the past month with recent hospitalization for PNA. Cough is prod with minimal clear sputum.   cough much worse when lie down with freq awakening > clear mucus was thick and green  , now clear saba helps wheeze, not really helping the cough, not used since May 2 - not really sob unless coughing rec Pantoprazole (protonix) 40 mg   Take 30-60 min before first meal of the day and Pepcid 20 mg one bedtime    GERD diet   Dulera 100 Take 2 puffs first thing in am and then another 2 puffs about 12 hours later.  Prednisone 10 mg take  4 each am x 2 days,   2 each am x 2 days,  1 each am x 2 days and stop Use the cough medication   If not improving call [facility] [phone] for sinus CT scan before next visit.    05/30/2017  f/u ov/Dvonte Gatliff re:  Chronic asthma on symb 80 x 2 in am  Chief Complaint  Patient presents with  . Acute Visit    Wheezing for the past 2-3 wks.  She states she is feeling more SOB and has been coughing up some green sputum.  She has been using her albuterol inhaler 3 x daily on average.   Dyspnea:  Not limited by breathing from desired activities   At baseline  Cough: off and on more so at hs and usually not keeping her up / hardly needed any rescue noct or otherwise  Sleep: doing well most nights  Then acutely ill x 3 weeks / not better with albuterol / "wheezing heard by daughter" s stethoscope/ mucinex helps rec zpak Prednisone 10 mg take  4 each am x 2 days,   2 each am x 2 days,  1 each am x 2 days and stop  Plan A = Automatic = Symbicort 80 Take 2 puffs first thing in am and then another 2 puffs about 12 hours later.  Work on inhaler technique:  Plan B = Backup Only use your albuterol (proair) as a rescue medication Plan C = Crisis - only use your albuterol nebulizer if you first try Plan B  Please schedule a follow up visit in 3 months but call sooner if needed              02/28/2018  f/u ov/Zaydn Gutridge re: cough variant asthma/ maint on symb 80 2bid  Chief Complaint  Patient presents with  . Follow-up    Breathing is doing well. She rarely uses her albuterol.  Dyspnea:  Not limited by breathing from desired activities   Cough: occ if gets hot Sleeping: bed flat, 3 pillow SABA use: rarely/ way out of date proair in hand and it's clogged up 02: none Seeing Dr Loreta Ave for indigestion/gerd  rec No change in medications GERD  Only use your albuterol as a rescue medication   Mar 13, 2018  EGD > pos medium sized [facility]    04/11/2018 acute extended ov/Virlee Stroschein re: asthma flare  Chief Complaint  Patient presents with  . Acute Visit    Increased SOB and wheezing over the past 10 days. She is using her albuterol inhaler 4 x daily on average.   acutely ill on 03/31/2018 with cough / wheezing  So called in 04/03/18 x 6 days pred and 50% better then worse again gradually but   not using symb first thing in am and has not used proair yet 04/11/2018 prior to OV   Was taking prevacid before bfast and stopped ? Why  rec Plan A = Automatic = symbicort 160 Take 2 puffs first thing in am and then another 2 puffs about 12 hours later and add  singulair 10 mg each pm Prednisone Take 4 for three days 3 for three days 2 for three days 1 for three days and stop  Pantoprazole (protonix) 40 mg   Take  30-60 min before first meal of the day and Pepcid (famotidine)  20 mg one @  bedtime until return to office - this is the best way to tell whether stomach acid is contributing to your problem.   Plan B = Backup Only use your albuterol inhaler as a rescue medication GERD  Diet  Please schedule a follow up office visit in 2 weeks, sooner if needed  with all medications /inhalers/ solutions in hand so we can verify exactly what you are taking. This includes all medications from all doctors and over the counters - given sample of symb 160 x 60 doses and has 160 doses of proair remaining    04/27/2018  f/u ov/Kelise Kuch re: cough variant asthma/ brought symb 160 sample = 10 pffs left, did not bring saba as req  Chief Complaint  Patient presents with  . Follow-up    Breathing has improved to her baseline. She has only used her albuterol 2 x since the last visit.   Dyspnea:  Able to power walk including hills / chest tight with cold air exp Cough: none Sleeping: 3 pillows/ cough when flat SABA use: none  02: no rec Use your symbicort right before you brush your teeth in the am and pm  Discuss the longterm risk / benefit of pantoprazole with Dr Loreta Ave before the refills    04/10/2019  f/u ov/Lazariah Savard re: cough variant asthma symbicort 160/ singulair   Ppi/ pepcid  Chief Complaint  Patient presents with  . Follow-up    Breathing is doing well. She rarely uses the albuterol.   Dyspnea:  Not limited by breathing from desired activities   Cough: none Sleeping: 2 pillows  SABA use: none  02: none rec No change in medication Try Pepcid 20 mg after breakfast and supper and leave off pantoprazole  To see if do just as well  GERD (REFLUX)  is an extremely common cause of respiratory symptoms just like yours , many times with no obvious heartburn at all.    It can be treated with medication   10/09/2019  f/u ov/Durenda Pechacek re:  Worse x one month  Chief Complaint  Patient presents with  . Follow-up    pt had two sinus infection.pt took muniex,amoxcillin, sudafed  Dyspnea:  None  Cough: sense of drainage/ nasal passages congested  Sleeping: better  Now  SABA use: none 02: none    No obvious day to day or daytime variability or assoc excess/ purulent sputum or mucus plugs or hemoptysis or cp or chest tightness,   or overt  hb symptoms.   Sleeping  without nocturnal  or early am exacerbation  of respiratory  c/o's or need for noct saba. Also denies any obvious fluctuation of symptoms with weather or environmental changes or other aggravating or alleviating factors except as outlined above   No unusual exposure hx or h/o childhood pna/ asthma or knowledge of premature birth.  Current Allergies, Complete Past Medical History, Past Surgical History, Family History, and Social History were reviewed in [employer] record.  ROS  The following are not active complaints unless bolded Hoarseness, sore throat, dysphagia, dental problems, itching, sneezing,  nasal congestion or discharge of excess mucus or purulent secretions, ear ache,   fever, chills, sweats, unintended wt loss or wt gain, classically pleuritic or exertional cp,  orthopnea pnd or arm/hand swelling  or leg swelling, presyncope, palpitations, abdominal pain, anorexia, nausea, vomiting, diarrhea  or change in bowel habits or change in bladder habits, change in stools or change in urine, dysuria, hematuria,  rash, arthralgias, visual complaints, headache, numbness, weakness or ataxia or problems with walking or coordination,  change in mood or  memory.        Current Meds  Medication Sig  . albuterol (PROVENTIL HFA;VENTOLIN HFA) 108 (90 Base) MCG/ACT inhaler INHALE 1-2 PUFFS INTO THE LUNGS EVERY 6 HRS AS NEEDED FOR WHEEZING  . budesonide-formoterol (SYMBICORT) 160-4.5  MCG/ACT inhaler TAKE 2 PUFFS FIRST THING IN AM AND THEN ANOTHER 2 PUFFS ABOUT 12 HOURS LATER.  . famotidine (PEPCID) 20 MG tablet One twice daily after meals  . fluticasone (FLONASE) 50 MCG/ACT nasal spray Place 2 sprays into both nostrils daily.  Marland Kitchen ibuprofen (ADVIL) 600 MG tablet Take 1 tablet (600 mg total) by mouth every 6 (six) hours as needed.  . Misc Natural Products (SUPER GREENS) POWD Take by mouth daily.  . montelukast (SINGULAIR) 10 MG tablet Take 1 tablet (10 mg total) by mouth at bedtime.  . Multiple Vitamins-Minerals (ALIVE WOMENS ENERGY PO) Take 1 tablet by mouth daily.  . NON FORMULARY l-cartnitine alpha lopic acid 1 tab po qd  . NON FORMULARY Anxiety stress relif  . NON FORMULARY Amberen Menopausal medication  . venlafaxine XR (EFFEXOR-XR) 37.5 MG 24 hr capsule TAKE 1 CAPSULE BY MOUTH DAILY WITH BREAKFAST.              Objective:   Physical Exam       10/09/2019    190  04/10/2019    187  07/11/2013    172> 08/22/2013 176 > 09/11/2013  177 > 10/09/2013  173 > 01/07/2014 173 > 06/06/2014  174 > 12/03/2014   180 >  12/02/2016     180 > 05/30/2017  183  > 11/22/2017 175 > 02/28/2018   182  > 04/11/2018   181 > 04/27/2018  181     06/27/13 171 lb 12.8 oz (77.928 kg)  06/23/13 170 lb 13.7 oz (77.5 kg)  11/13/11 197 lb (89.359 kg)     amb bf nad   Vital signs reviewed  10/09/2019  - Note at rest  02 sats  96% on RA     HEENT : pt wearing mask not removed for exam due to covid -19 concerns.    NECK :  without JVD/Nodes/TM/ nl carotid upstrokes bilaterally   LUNGS: no acc muscle use,  Nl contour chest which is clear to A and P bilaterally without cough on insp or exp maneuvers   CV:  RRR  no s3 or murmur or increase in P2, and no edema   ABD:  soft and nontender with nl inspiratory excursion in the supine position. No bruits or organomegaly appreciated, bowel sounds nl  MS:  Nl gait/ ext warm without deformities, calf tenderness, cyanosis or clubbing No obvious joint  restrictions   SKIN: warm and dry without lesions    NEURO:  alert, approp, nl sensorium with  no motor or cerebellar deficits apparent.           Assessment & Plan:

## 2019-10-17 DIAGNOSIS — I87392 Chronic venous hypertension (idiopathic) with other complications of left lower extremity: Secondary | ICD-10-CM | POA: Diagnosis not present

## 2019-10-17 DIAGNOSIS — I8311 Varicose veins of right lower extremity with inflammation: Secondary | ICD-10-CM | POA: Diagnosis not present

## 2019-10-23 ENCOUNTER — Ambulatory Visit: Payer: 59 | Admitting: Nurse Practitioner

## 2019-11-13 DIAGNOSIS — I8311 Varicose veins of right lower extremity with inflammation: Secondary | ICD-10-CM | POA: Diagnosis not present

## 2019-11-13 DIAGNOSIS — I87392 Chronic venous hypertension (idiopathic) with other complications of left lower extremity: Secondary | ICD-10-CM | POA: Diagnosis not present

## 2019-11-20 ENCOUNTER — Ambulatory Visit: Payer: 59 | Admitting: Nurse Practitioner

## 2019-11-20 DIAGNOSIS — Z0289 Encounter for other administrative examinations: Secondary | ICD-10-CM

## 2019-12-06 ENCOUNTER — Other Ambulatory Visit: Payer: Self-pay | Admitting: Nurse Practitioner

## 2019-12-06 DIAGNOSIS — F419 Anxiety disorder, unspecified: Secondary | ICD-10-CM

## 2019-12-06 DIAGNOSIS — R61 Generalized hyperhidrosis: Secondary | ICD-10-CM

## 2019-12-06 NOTE — Telephone Encounter (Signed)
I spoke with patient. Doing well on Effexor. Has Annual exam/follow up scheduled with you for 01/01/20.

## 2019-12-13 DIAGNOSIS — J45909 Unspecified asthma, uncomplicated: Secondary | ICD-10-CM | POA: Diagnosis not present

## 2019-12-13 DIAGNOSIS — R635 Abnormal weight gain: Secondary | ICD-10-CM | POA: Diagnosis not present

## 2019-12-13 DIAGNOSIS — F419 Anxiety disorder, unspecified: Secondary | ICD-10-CM | POA: Diagnosis not present

## 2019-12-13 DIAGNOSIS — Z13220 Encounter for screening for lipoid disorders: Secondary | ICD-10-CM | POA: Diagnosis not present

## 2019-12-13 DIAGNOSIS — Z13228 Encounter for screening for other metabolic disorders: Secondary | ICD-10-CM | POA: Diagnosis not present

## 2019-12-25 DIAGNOSIS — L218 Other seborrheic dermatitis: Secondary | ICD-10-CM | POA: Diagnosis not present

## 2019-12-31 ENCOUNTER — Ambulatory Visit (INDEPENDENT_AMBULATORY_CARE_PROVIDER_SITE_OTHER): Payer: 59 | Admitting: Nurse Practitioner

## 2019-12-31 ENCOUNTER — Other Ambulatory Visit: Payer: Self-pay

## 2019-12-31 ENCOUNTER — Encounter: Payer: Self-pay | Admitting: Nurse Practitioner

## 2019-12-31 VITALS — BP 124/80 | Ht 65.0 in | Wt 188.0 lb

## 2019-12-31 DIAGNOSIS — F419 Anxiety disorder, unspecified: Secondary | ICD-10-CM | POA: Diagnosis not present

## 2019-12-31 DIAGNOSIS — N951 Menopausal and female climacteric states: Secondary | ICD-10-CM

## 2019-12-31 DIAGNOSIS — Z30431 Encounter for routine checking of intrauterine contraceptive device: Secondary | ICD-10-CM | POA: Diagnosis not present

## 2019-12-31 DIAGNOSIS — Z01419 Encounter for gynecological examination (general) (routine) without abnormal findings: Secondary | ICD-10-CM

## 2019-12-31 MED ORDER — VENLAFAXINE HCL ER 75 MG PO CP24: 75.0000 mg | ORAL_CAPSULE | Freq: Every day | ORAL | 1 refills | Status: DC

## 2019-12-31 NOTE — Progress Notes (Signed)
   Marie Harper Jul 17, 1975 [id number]   History:  44 y.o. G1P1001 presents for annual exam. Light monthly cycle/Mirena IUD inserted 12/2018. Normal pap and mammogram history. Taking Effexor for night sweats, insomnia, and anxiety. She feels this has helped some but symptoms persist. Not currently sexually active.   Gynecologic History Patient's last menstrual period was 12/23/2019.   Contraception: IUD Last Pap: 2019. Results were: normal Last mammogram: 05/2017. Results were: normal  Past medical history, past surgical history, family history and social history were all reviewed and documented in the EPIC chart.  ROS:  A ROS was performed and pertinent positives and negatives are included.  Exam:  Vitals:   12/31/19 1615  BP: 124/80  Weight: 188 lb (85.3 kg)  Height: 5\' 5"  (1.651 m)   Body mass index is 31.28 kg/m.  General appearance:  Normal Thyroid:  Symmetrical, normal in size, without palpable masses or nodularity. Respiratory  Auscultation:  Clear without wheezing or rhonchi Cardiovascular  Auscultation:  Regular rate, without rubs, murmurs or gallops  Edema/varicosities:  Not grossly evident Abdominal  Soft,nontender, without masses, guarding or rebound.  Liver/spleen:  No organomegaly noted  Hernia:  None appreciated  Skin  Inspection:  Grossly normal   Breasts: Examined lying and sitting.   Right: Without masses, retractions, discharge or axillary adenopathy.   Left: Without masses, retractions, discharge or axillary adenopathy. Gentitourinary   Inguinal/mons:  Normal without inguinal adenopathy  External genitalia:  Normal  BUS/Urethra/Skene's glands:  Normal  Vagina:  Normal  Cervix:  Normal, IUD string visible  Uterus:  Normal in size, shape and contour.  Midline and mobile  Adnexa/parametria:     Rt: Without masses or tenderness.   Lt: Without masses or tenderness.  Anus and perineum: Normal  Digital rectal exam: Declined  Assessment/Plan:   44 y.o. G1P1001 for annual exam.   Well female exam with routine gynecological exam - Education provided on SBEs, importance of preventative screenings, current guidelines, high calcium diet, regular exercise, and multivitamin daily. Labs with PCP.   Perimenopausal vasomotor symptoms - Plan: Follicle stimulating hormone, venlafaxine XR (EFFEXOR XR) 75 MG 24 hr capsule daily. Dose increased for persisting symptoms.   Encounter for routine checking of intrauterine contraceptive device (IUD) - inserted 12/2018 per patient. Light monthly cycle. String visible and in correct position on exam.   Anxiety - Plan: venlafaxine XR (EFFEXOR XR) 75 MG 24 hr capsule daily. Has noticed an improvement since starting in April but feels she should increase her dose.   Screening for cervical cancer - No history of significant abnormal pap smears. Will repeat pap at 3-year interval per guidelines.   Screening for breast cancer - Normal mammogram history. Overdue for mammogram. Discussed current guidelines and importance of preventative screenings. Normal breast exam today.   Follow up in 1 year for annual.      [facility], 4:34 PM 12/31/2019

## 2019-12-31 NOTE — Patient Instructions (Signed)
Health Maintenance, Female Adopting a healthy lifestyle and getting preventive care are important in promoting health and wellness. Ask your health care provider about:  The right schedule for you to have regular tests and exams.  Things you can do on your own to prevent diseases and keep yourself healthy. What should I know about diet, weight, and exercise? Eat a healthy diet   Eat a diet that includes plenty of vegetables, fruits, low-fat dairy products, and lean protein.  Do not eat a lot of foods that are high in solid fats, added sugars, or sodium. Maintain a healthy weight Body mass index (BMI) is used to identify weight problems. It estimates body fat based on height and weight. Your health care provider can help determine your BMI and help you achieve or maintain a healthy weight. Get regular exercise Get regular exercise. This is one of the most important things you can do for your health. Most adults should:  Exercise for at least 150 minutes each week. The exercise should increase your heart rate and make you sweat (moderate-intensity exercise).  Do strengthening exercises at least twice a week. This is in addition to the moderate-intensity exercise.  Spend less time sitting. Even light physical activity can be beneficial. Watch cholesterol and blood lipids Have your blood tested for lipids and cholesterol at 43 years of age, then have this test every 5 years. Have your cholesterol levels checked more often if:  Your lipid or cholesterol levels are high.  You are older than 43 years of age.  You are at high risk for heart disease. What should I know about cancer screening? Depending on your health history and family history, you may need to have cancer screening at various ages. This may include screening for:  Breast cancer.  Cervical cancer.  Colorectal cancer.  Skin cancer.  Lung cancer. What should I know about heart disease, diabetes, and high blood  pressure? Blood pressure and heart disease  High blood pressure causes heart disease and increases the risk of stroke. This is more likely to develop in people who have high blood pressure readings, are of African descent, or are overweight.  Have your blood pressure checked: ? Every 3-5 years if you are 18-39 years of age. ? Every year if you are 40 years old or older. Diabetes Have regular diabetes screenings. This checks your fasting blood sugar level. Have the screening done:  Once every three years after age 40 if you are at a normal weight and have a low risk for diabetes.  More often and at a younger age if you are overweight or have a high risk for diabetes. What should I know about preventing infection? Hepatitis B If you have a higher risk for hepatitis B, you should be screened for this virus. Talk with your health care provider to find out if you are at risk for hepatitis B infection. Hepatitis C Testing is recommended for:  Everyone born from 1945 through 1965.  Anyone with known risk factors for hepatitis C. Sexually transmitted infections (STIs)  Get screened for STIs, including gonorrhea and chlamydia, if: ? You are sexually active and are younger than 43 years of age. ? You are older than 43 years of age and your health care provider tells you that you are at risk for this type of infection. ? Your sexual activity has changed since you were last screened, and you are at increased risk for chlamydia or gonorrhea. Ask your health care provider if   you are at risk.  Ask your health care provider about whether you are at high risk for HIV. Your health care provider may recommend a prescription medicine to help prevent HIV infection. If you choose to take medicine to prevent HIV, you should first get tested for HIV. You should then be tested every 3 months for as long as you are taking the medicine. Pregnancy  If you are about to stop having your period (premenopausal) and  you may become pregnant, seek counseling before you get pregnant.  Take 400 to 800 micrograms (mcg) of folic acid every day if you become pregnant.  Ask for birth control (contraception) if you want to prevent pregnancy. Osteoporosis and menopause Osteoporosis is a disease in which the bones lose minerals and strength with aging. This can result in bone fractures. If you are 65 years old or older, or if you are at risk for osteoporosis and fractures, ask your health care provider if you should:  Be screened for bone loss.  Take a calcium or vitamin D supplement to lower your risk of fractures.  Be given hormone replacement therapy (HRT) to treat symptoms of menopause. Follow these instructions at home: Lifestyle  Do not use any products that contain nicotine or tobacco, such as cigarettes, e-cigarettes, and chewing tobacco. If you need help quitting, ask your health care provider.  Do not use street drugs.  Do not share needles.  Ask your health care provider for help if you need support or information about quitting drugs. Alcohol use  Do not drink alcohol if: ? Your health care provider tells you not to drink. ? You are pregnant, may be pregnant, or are planning to become pregnant.  If you drink alcohol: ? Limit how much you use to 0-1 drink a day. ? Limit intake if you are breastfeeding.  Be aware of how much alcohol is in your drink. In the [place] one drink equals one 12 oz bottle of beer (355 mL), one 5 oz glass of wine (148 mL), or one 1 oz glass of hard liquor (44 mL). General instructions  Schedule regular health, dental, and eye exams.  Stay current with your vaccines.  Tell your health care provider if: ? You often feel depressed. ? You have ever been abused or do not feel safe at home. Summary  Adopting a healthy lifestyle and getting preventive care are important in promoting health and wellness.  Follow your health care provider's instructions about healthy  diet, exercising, and getting tested or screened for diseases.  Follow your health care provider's instructions on monitoring your cholesterol and blood pressure. This information is not intended to replace advice given to you by your health care provider. Make sure you discuss any questions you have with your health care provider. Document Revised: 02/01/2018 Document Reviewed: 02/01/2018 Elsevier Patient Education  2020 [employer]vier Inc.  

## 2020-01-02 LAB — FOLLICLE STIMULATING HORMONE: FSH: 15.9 m[IU]/mL

## 2020-01-03 DIAGNOSIS — Z0000 Encounter for general adult medical examination without abnormal findings: Secondary | ICD-10-CM | POA: Diagnosis not present

## 2020-01-15 ENCOUNTER — Other Ambulatory Visit: Payer: Self-pay | Admitting: Family Medicine

## 2020-01-15 DIAGNOSIS — Z1231 Encounter for screening mammogram for malignant neoplasm of breast: Secondary | ICD-10-CM

## 2020-02-06 ENCOUNTER — Telehealth: Payer: Self-pay | Admitting: *Deleted

## 2020-02-06 NOTE — Telephone Encounter (Signed)
Patient called c/o yeast symptoms vaginal itching,asked if diflucan tablet can be sent to pharmacy?

## 2020-02-07 MED ORDER — FLUCONAZOLE 150 MG PO TABS
150.0000 mg | ORAL_TABLET | Freq: Once | ORAL | 0 refills | Status: AC
Start: 2020-02-07 — End: 2020-02-07

## 2020-02-07 NOTE — Telephone Encounter (Signed)
Yes please send in. Thank you.  

## 2020-02-07 NOTE — Telephone Encounter (Signed)
Rx sent 

## 2020-03-23 ENCOUNTER — Other Ambulatory Visit: Payer: Self-pay | Admitting: Nurse Practitioner

## 2020-03-23 DIAGNOSIS — N951 Menopausal and female climacteric states: Secondary | ICD-10-CM

## 2020-03-23 DIAGNOSIS — F419 Anxiety disorder, unspecified: Secondary | ICD-10-CM

## 2020-03-26 NOTE — Telephone Encounter (Signed)
Pharmacy placed note on Rx "REQUEST FOR 90 DAYS PRESCRIPTION. DX Code Needed."  Tiffany approved Rx on 12/31/19 90 with 1 refill. I only sent 90 day supply

## 2020-03-27 DIAGNOSIS — L2084 Intrinsic (allergic) eczema: Secondary | ICD-10-CM | POA: Diagnosis not present

## 2020-03-27 DIAGNOSIS — L700 Acne vulgaris: Secondary | ICD-10-CM | POA: Diagnosis not present

## 2020-03-31 ENCOUNTER — Encounter: Payer: Self-pay | Admitting: Medical

## 2020-03-31 ENCOUNTER — Ambulatory Visit (INDEPENDENT_AMBULATORY_CARE_PROVIDER_SITE_OTHER): Payer: 59 | Admitting: Medical

## 2020-03-31 ENCOUNTER — Other Ambulatory Visit: Payer: Self-pay

## 2020-03-31 VITALS — BP 120/86 | HR 81 | Ht 65.0 in | Wt 191.2 lb

## 2020-03-31 DIAGNOSIS — M542 Cervicalgia: Secondary | ICD-10-CM | POA: Diagnosis not present

## 2020-03-31 DIAGNOSIS — R253 Fasciculation: Secondary | ICD-10-CM | POA: Diagnosis not present

## 2020-03-31 DIAGNOSIS — F419 Anxiety disorder, unspecified: Secondary | ICD-10-CM | POA: Insufficient documentation

## 2020-03-31 DIAGNOSIS — M62838 Other muscle spasm: Secondary | ICD-10-CM | POA: Diagnosis not present

## 2020-03-31 MED ORDER — CYCLOBENZAPRINE HCL 5 MG PO TABS
5.0000 mg | ORAL_TABLET | Freq: Two times a day (BID) | ORAL | 0 refills | Status: DC | PRN
Start: 2020-03-31 — End: 2020-09-23

## 2020-03-31 NOTE — Patient Instructions (Addendum)
Massage Therapy:  Sharen Hint [facility] Massage [address] [place] [place], [place] [address] [phone] [email or web address]    Counseling Services (NON- psychiatrist offices)  Dr. Nicole Cella, [place] [phone] [address], [place] [address]   [facility] [phone] [address], [place], [place] [address]   Glade Lloyd, therapist [phone] [address] [place], [place] [address]   [facility] [phone] [address] [place], [place], [place] [address]

## 2020-03-31 NOTE — Progress Notes (Unsigned)
Subjective:  Marie Harper is a 45 y.o. female who presents for Chief Complaint  Patient presents with  . New Patient (Initial Visit)    Muscle spasm in left shoulder radiating down left arm to hand. Bilateral leg cramping. Discuss anxiety meds      Is a new patient to establish care.  For the past 2 months she has been getting some muscle spasms in her left arm and the neck.  Some neck and shoulder discomfort on the left.  At times her muscles twitch or jump.  She denies fall injury or trauma.  She denies  tingling or weakness in the arms.  She does get some numbness in the left arm at times including hand going numb, intermittent.  She also gets some cramps in her legs at times.  Right leg seems to be worse than the left at times.  This is also been going on for months  She just had some labs done at another office a few months ago for a physical and says the labs are normal  She eats typically breakfast and dinner but not lunch and she does not eat a lot of vegetables or fruits.  She does drink a fair amount of water.  She has a history of asthma and allergies.  She takes Singulair and Symbicort daily.  She sees Dr. Sherene Sires pulmonology  She has anxiety issues.  Has been on Effexor for 8 months.  She has never seen a Veterinary surgeon.  She wonders if the Effexor causes the muscle twitches.  She does not think the Effexor works all that well.  She lives with her daughter.  Her daughter's father is moving out.  They are not formally separated but have not really been a relationship for a while   She does warehouse work.   No other aggravating or relieving factors.    No other c/o.  The following portions of the patient's history were reviewed and updated as appropriate: allergies, current medications, past family history, past medical history, past social history, past surgical history and problem list.  ROS Otherwise as in subjective above  Objective: BP 120/86   Pulse 81   Ht 5\' 5"   (1.651 m)   Wt 191 lb 3.2 oz (86.7 kg)   SpO2 98%   BMI 31.82 kg/m   General appearance: alert, no distress, well developed, well nourished Mild tenderness left neck, range of motion mildly reduced to the left with flexion and rotation otherwise neck nontender no mass no lymphadenopathy no thyromegaly Left arm nontender, no swelling, no deformity.  Slightly reduced range of motion with internal range of motion, she does note some discomfort with flexion of the shoulder Legs nontender no deformity no swelling no asymmetry Arms and legs neurovascularly intact Psych: Pleasant, answers questions appropriately, good eye contact   Assessment: Encounter Diagnoses  Name Primary?  . Neck muscle spasm Yes  . Neck pain   . Anxiety   . Muscle twitch      Plan: We discussed her symptoms and concerns  I recommend she continue Effexor for now but establish with a counselor ASAP.  We discussed the benefits of counseling.  Upon recheck if needed we can change to Prozac or a different medication  We discussed that the muscle twitching may or may not be related to the Effexor but she is on a low dose  She does seem to have some left upper extremity radicular issues.  Offered physical therapy referral but she declines.  She will do some home exercises that we discussed, can use Flexeril occasionally at bedtime for spasm.  Discussed massage and heat therapy  We will request labs from her prior provider that were done just a few months ago  Denette was seen today for new patient (initial visit).  Diagnoses and all orders for this visit:  Neck muscle spasm  Neck pain  Anxiety  Muscle twitch  Other orders -     cyclobenzaprine (FLEXERIL) 5 MG tablet; Take 1 tablet (5 mg total) by mouth 3 times/day as needed-between meals & bedtime for muscle spasms.    Follow up: 3 weeks

## 2020-04-04 ENCOUNTER — Ambulatory Visit: Payer: 59 | Admitting: Internal Medicine

## 2020-04-11 ENCOUNTER — Ambulatory Visit (INDEPENDENT_AMBULATORY_CARE_PROVIDER_SITE_OTHER): Payer: 59 | Admitting: Internal Medicine

## 2020-04-11 ENCOUNTER — Encounter: Payer: Self-pay | Admitting: Internal Medicine

## 2020-04-11 ENCOUNTER — Other Ambulatory Visit: Payer: Self-pay

## 2020-04-11 DIAGNOSIS — J45991 Cough variant asthma: Secondary | ICD-10-CM

## 2020-04-11 DIAGNOSIS — R058 Other specified cough: Secondary | ICD-10-CM | POA: Diagnosis not present

## 2020-04-11 NOTE — Progress Notes (Signed)
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 no problems as child or young adult with tendency to bad head colds since early 2014 with Sinus xray neg 08/28/12 then abrupt onset  cough / wheeze /doe then Fall 2014 p developed sneezing/ head congestion  dx'd as sinusitis at [facility] [address]  rx abx then worse again in November 2014 progressively worse  Despite rx and referred to Pulmonary clinic 06/28/13 by Cammy Fulp p admit     Admit date: 06/23/2013  Discharge date: 06/25/2013  Discharge Diagnoses:  CAP (community acquired pneumonia)  Anemia  Bronchospasm         06/27/2013 1st [facility] office visit/ Wert  Chief Complaint  Patient presents with  . Pulmonary Consult    Self referral- pt c/o cough and SOB since Nov 2014. Worse for the past month with recent hospitalization for PNA. Cough is prod with minimal clear sputum.   cough much worse when lie down with freq awakening > clear mucus was thick and green  , now clear saba helps wheeze, not really helping the cough, not used since May 2 - not really sob unless coughing rec Pantoprazole (protonix) 40 mg   Take 30-60 min before first meal of the day and Pepcid 20 mg one bedtime    GERD diet   Dulera 100 Take 2 puffs first thing in am and then another 2 puffs about 12 hours later.  Prednisone 10 mg take  4 each am x 2 days,   2 each am x 2 days,  1 each am x 2 days and stop Use the cough medication   If not improving call [facility] [phone] for sinus CT scan before next visit.    05/30/2017  f/u ov/Wert re:  Chronic asthma on symb 80 x 2 in am  Chief Complaint  Patient presents with  . Acute Visit    Wheezing for the past 2-3 wks.  She states she is feeling more SOB and has been coughing up some green sputum.  She has been using her albuterol inhaler 3 x daily on average.   Dyspnea:  Not limited by breathing from desired activities   At baseline  Cough: off and on more so at hs and usually not keeping her up / hardly needed any rescue noct or otherwise  Sleep: doing well most nights  Then acutely ill x 3 weeks / not better with albuterol / "wheezing heard by daughter" s stethoscope/ mucinex helps rec zpak Prednisone 10 mg take  4 each am x 2 days,   2 each am x 2 days,  1 each am x 2 days and stop  Plan A = Automatic = Symbicort 80 Take 2 puffs first thing in am and then another 2 puffs about 12 hours later.  Work on inhaler technique:  Plan B = Backup Only use your albuterol (proair) as a rescue medication Plan C = Crisis - only use your albuterol nebulizer if you first try Plan B  Please schedule a follow up visit in 3 months but call sooner if needed              02/28/2018  f/u ov/Wert re: cough variant asthma/ maint on symb 80 2bid  Chief Complaint  Patient presents with  . Follow-up    Breathing is doing well. She rarely uses her albuterol.  Dyspnea:  Not limited by breathing from desired activities   Cough: occ if gets hot Sleeping: bed flat, 3 pillow SABA use: rarely/ way out of date proair in hand and it's clogged up 02: none Seeing Dr Loreta Ave for indigestion/gerd  rec No change in medications GERD  Only use your albuterol as a rescue medication   Mar 13, 2018  EGD > pos medium sized [facility]    04/11/2018 acute extended ov/Wert re: asthma flare  Chief Complaint  Patient presents with  . Acute Visit    Increased SOB and wheezing over the past 10 days. She is using her albuterol inhaler 4 x daily on average.   acutely ill on 03/31/2018 with cough / wheezing  So called in 04/03/18 x 6 days pred and 50% better then worse again gradually but   not using symb first thing in am and has not used proair yet 04/11/2018 prior to OV   Was taking prevacid before bfast and stopped ? Why  rec Plan A = Automatic = symbicort 160 Take 2 puffs first thing in am and then another 2 puffs about 12 hours later and add  singulair 10 mg each pm Prednisone Take 4 for three days 3 for three days 2 for three days 1 for three days and stop  Pantoprazole (protonix) 40 mg   Take  30-60 min before first meal of the day and Pepcid (famotidine)  20 mg one @  bedtime until return to office - this is the best way to tell whether stomach acid is contributing to your problem.   Plan B = Backup Only use your albuterol inhaler as a rescue medication GERD  Diet  Please schedule a follow up office visit in 2 weeks, sooner if needed  with all medications /inhalers/ solutions in hand so we can verify exactly what you are taking. This includes all medications from all doctors and over the counters - given sample of symb 160 x 60 doses and has 160 doses of proair remaining    04/27/2018  f/u ov/Wert re: cough variant asthma/ brought symb 160 sample = 10 pffs left, did not bring saba as req  Chief Complaint  Patient presents with  . Follow-up    Breathing has improved to her baseline. She has only used her albuterol 2 x since the last visit.   Dyspnea:  Able to power walk including hills / chest tight with cold air exp Cough: none Sleeping: 3 pillows/ cough when flat SABA use: none  02: no rec Use your symbicort right before you brush your teeth in the am and pm  Discuss the longterm risk / benefit of pantoprazole with Dr Loreta Ave before the refills    04/10/2019  f/u ov/Wert re: cough variant asthma symbicort 160/ singulair   Ppi/ pepcid  Chief Complaint  Patient presents with  . Follow-up    Breathing is doing well. She rarely uses the albuterol.   Dyspnea:  Not limited by breathing from desired activities   Cough: none Sleeping: 2 pillows  SABA use: none  02: none rec No change in medication Try Pepcid 20 mg after breakfast and supper and leave off pantoprazole  To see if do just as well  GERD (REFLUX)  is an extremely common cause of respiratory symptoms just like yours , many times with no obvious heartburn at all.    It can be treated with medication   10/09/2019  f/u ov/Wert re:  Worse x one month  Chief Complaint  Patient presents with  . Follow-up    pt had two sinus infection.pt took muniex,amoxcillin, sudafed  Dyspnea:  None  Cough: sense of drainage/ nasal passages congested  Sleeping: better  Now  SABA use: none 02: none  rec Depomedrol 120 mg IM today Blow symbicort out thru nose Advil cold and sinus as signed  I emphasized that nasal steroids have no immediat  I very strongly recommend you get the moderna or pfizer vaccine    04/11/2020  f/u ov/Wert re:  Cough variant asthma  Chief Complaint  Patient presents with  . Follow-up    Breathing is overall doing well. She has not had to use her albuterol recently.    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   Sleeping  without nocturnal  or early am exacerbation  of respiratory  c/o's or need for noct saba. Also denies any obvious fluctuation of symptoms with weather or environmental changes or other aggravating or alleviating factors except as outlined above   No unusual exposure hx or h/o childhood pna/ asthma or knowledge of premature birth.  Current Allergies, Complete Past Medical History, Past Surgical History, Family History, and Social History were reviewed in [employer] record.  ROS  The following are not active complaints unless bolded Hoarseness, sore throat, dysphagia, dental problems, itching, sneezing,  nasal congestion or discharge of excess mucus or purulent secretions, ear ache,   fever, chills, sweats, unintended wt loss or wt gain, classically pleuritic or exertional cp,   orthopnea pnd or arm/hand swelling  or leg swelling, presyncope, palpitations, abdominal pain, anorexia, nausea, vomiting, diarrhea  or change in bowel habits or change in bladder habits, change in stools or change in urine, dysuria, hematuria,  rash, arthralgias, visual complaints, headache, numbness, weakness or ataxia or problems with walking or coordination,  change in mood or  memory.        Current Meds  Medication Sig  . albuterol (PROVENTIL HFA;VENTOLIN HFA) 108 (90 Base) MCG/ACT inhaler INHALE 1-2 PUFFS INTO THE LUNGS EVERY 6 HRS AS NEEDED FOR WHEEZING  . budesonide-formoterol (SYMBICORT) 160-4.5 MCG/ACT inhaler TAKE 2 PUFFS FIRST THING IN AM AND THEN ANOTHER 2 PUFFS ABOUT 12 HOURS LATER.  . cyclobenzaprine (FLEXERIL) 5 MG tablet Take 1 tablet (5 mg total) by mouth 3 times/day as needed-between meals & bedtime for muscle spasms.  . famotidine (PEPCID) 20 MG tablet One twice daily after meals  . ibuprofen (ADVIL) 600 MG tablet Take 1 tablet (600 mg total) by mouth every 6 (six) hours as needed.  Marland Kitchen levonorgestrel (MIRENA, 52 MG,) 20 MCG/24HR IUD Mirena 20 mcg/24 hours (7 yrs) 52 mg intrauterine device  Take 1 device by intrauterine route.  Marland Kitchen LINZESS 145 MCG CAPS capsule Take 145 mcg by mouth daily.  . montelukast (SINGULAIR) 10 MG tablet Take 1 tablet (10 mg total) by mouth at bedtime.  . Multiple Vitamins-Minerals (ALIVE WOMENS ENERGY PO) Take 1 tablet by mouth daily.  Marland Kitchen venlafaxine (EFFEXOR) 37.5 MG tablet Take 37.5 mg by mouth 2 (two) times daily.            Objective:  Physical Exam     04/11/2020   190  10/09/2019    190  04/10/2019    187  07/11/2013    172> 08/22/2013 176 > 09/11/2013  177 > 10/09/2013  173 > 01/07/2014 173 > 06/06/2014  174 > 12/03/2014   180 >  12/02/2016     180 > 05/30/2017  183  > 11/22/2017 175 > 02/28/2018   182  > 04/11/2018   181 > 04/27/2018  181     06/27/13 171 lb 12.8 oz (77.928 kg)  06/23/13 170 lb 13.7 oz (77.5 kg)  11/13/11 197 lb (89.359 kg)        Vital signs reviewed  04/11/2020  - Note at rest 02 sats  100% on RA   General appearance:    amb bf nad     HEENT : pt wearing mask not removed for exam due to covid -19 concerns.    NECK :  without JVD/Nodes/TM/ nl carotid upstrokes bilaterally   LUNGS: no acc muscle use,  Nl contour chest which is clear to A and P bilaterally without cough on insp or exp maneuvers   CV:  RRR  no s3 or murmur or increase in P2, and no edema   ABD:  soft and nontender with nl inspiratory excursion in the supine position. No bruits or organomegaly appreciated, bowel sounds nl  MS:  Nl gait/ ext warm without deformities, calf tenderness, cyanosis or clubbing No obvious joint restrictions   SKIN: warm and dry without lesions    NEURO:  alert, approp, nl sensorium with  no motor or cerebellar deficits apparent.                Assessment & Plan:

## 2020-04-11 NOTE — Assessment & Plan Note (Addendum)
-   Max acid suppression started 06/27/13 > modified to h2 bid 10/09/2013  - sinus CT neg 07/24/13  - trial of chlortrimeton started 09/11/2013 >> - Allergy profile 11/22/17 >  Eos 0.4 /  IgE  29  RAST neg  - Mar 13, 2018  EGD > pos medium sized HH    Key is continued rx for gerd per DR Loreta Ave

## 2020-04-11 NOTE — Patient Instructions (Signed)
No change in medications   Please schedule a follow up visit in 12  months but call sooner if needed  

## 2020-04-11 NOTE — Assessment & Plan Note (Addendum)
Onset ? 2014 assoc with rhinitis  - started on dulera 100 2bid 06/27/13> better 07/11/2013  - 07/24/13 Sinus CT > Slight mucosal thickening in the right maxillary sinus. Remainder of the paranasal sinuses are clear. No air-fluid levels. No acute bony abnormality. Orbital soft tissues are unremarkable. - spirometry  08/22/2013 min airflow obst reflected in fef 25-75 despite prominent "wheeze" on exam - 09/11/13 increased dulera to 200 2bid due to concern not using the dulera frequently enough -  12/02/2016  extensive coaching HFA effectiveness =75% from  A baseline of 50%     -  Spirometry 12/02/2016  FEV1 2.50 (93%)  Ratio 74 off all rx, minimal curvature   - 11/22/2017   continue symb 80 - Allergy profile 11/22/17 >  Eos 0.4 /  IgE  29  RAST neg  - FENO 04/11/2018  =   69 p symb 80 2bid prior to study during flare of asthma  - 04/11/2018    increased symb to 160 2bid and added singulair > much better 04/27/2018  - 04/27/2018  After extensive coaching inhaler device,  effectiveness =    90%> continue symb 160 2bid/ singulair    All goals of chronic asthma control met including optimal function and elimination of symptoms with minimal need for rescue therapy.  Contingencies discussed in full including contacting this office immediately if not controlling the symptoms using the rule of two's.     F/u yearly          Each maintenance medication was reviewed in detail including emphasizing most importantly the difference between maintenance and prns and under what circumstances the prns are to be triggered using an action plan format where appropriate.  Total time for H and P, chart review, counseling, reviewing hfa device(s) and generating customized AVS unique to this office visit / same day charting = 15 min

## 2020-06-03 ENCOUNTER — Other Ambulatory Visit: Payer: Self-pay

## 2020-06-03 ENCOUNTER — Encounter: Payer: Self-pay | Admitting: Medical

## 2020-06-03 ENCOUNTER — Telehealth (INDEPENDENT_AMBULATORY_CARE_PROVIDER_SITE_OTHER): Payer: 59 | Admitting: Medical

## 2020-06-03 VITALS — Ht 65.0 in | Wt 188.0 lb

## 2020-06-03 DIAGNOSIS — G479 Sleep disorder, unspecified: Secondary | ICD-10-CM

## 2020-06-03 DIAGNOSIS — J301 Allergic rhinitis due to pollen: Secondary | ICD-10-CM | POA: Diagnosis not present

## 2020-06-03 DIAGNOSIS — R0683 Snoring: Secondary | ICD-10-CM

## 2020-06-03 DIAGNOSIS — F419 Anxiety disorder, unspecified: Secondary | ICD-10-CM

## 2020-06-03 MED ORDER — MONTELUKAST SODIUM 10 MG PO TABS: 10.0000 mg | ORAL_TABLET | Freq: Every day | ORAL | 3 refills | Status: DC

## 2020-06-03 MED ORDER — TRIAMCINOLONE ACETONIDE 55 MCG/ACT NA AERO
2.0000 | INHALATION_SPRAY | Freq: Every day | NASAL | 5 refills | Status: DC
Start: 2020-06-03 — End: 2022-02-25

## 2020-06-03 MED ORDER — SERTRALINE HCL 50 MG PO TABS
50.0000 mg | ORAL_TABLET | Freq: Every day | ORAL | 2 refills | Status: DC
Start: 2020-06-03 — End: 2020-07-23

## 2020-06-03 NOTE — Progress Notes (Signed)
Done

## 2020-06-03 NOTE — Progress Notes (Signed)
Subjective: This visit type was conducted due to national recommendations for restrictions regarding the COVID-19 Pandemic (e.g. social distancing) in an effort to limit this patient's exposure and mitigate transmission in our community.  Due to their co-morbid illnesses, this patient is at least at moderate risk for complications without adequate follow up.  This format is felt to be most appropriate for this patient at this time.    Documentation for virtual audio and video telecommunications through [facility] encounter:  The patient was located at home. The provider was located in the office. The patient did consent to this visit and is aware of possible charges through their insurance for this visit.  The other persons participating in this telemedicine service were none. Time spent on call was 20  minutes and in review of previous records >30 minutes total.  This virtual service is not related to other E/M service within previous 7 days.    Marie Harper is a 45 y.o. female who presents for Chief Complaint  Patient presents with  . Allergies  . Anxiety     Virtual visit to discuss anxiety.  She was a new patient last visit in 03/2020.  Worse anxiety is when menstrual cycle is approaching and during menstrual cycle.  Not sleeping well in general.   Has a lot going on in life.  Has not started seeing counselor yet.  Currently has been using effexor 75mg  once daily.  No prior medications besides effexor.  Been on Effexor at least 9 months now.  Stress level is about the same as last visit  Has ongoing problems with sleep.  Some nights only gets 3 hours of sleep.  Has tried melatonin, benadryl, velerean root.    Started zumba since last visit.  Been sneezing, couging, runny nose.  Needs refill on Singulair which used to be prescribed by her pulmonologist.  She uses for asthma and allergies.  She has been using Nasacort but just ran out of her prescription.  Works in a with  many duties including lifting boxes, running a forklift.  No other aggravating or relieving factors.    No other c/o.  The following portions of the patient's history were reviewed and updated as appropriate: allergies, current medications, past family history, past medical history, past social history, past surgical history and problem list.  ROS Otherwise as in subjective above  Objective: Ht 5\' 5"  (1.651 m)   Wt 188 lb (85.3 kg)   BMI 31.28 kg/m   General appearance: alert, no distress, well developed, well nourished Psych: Pleasant, answers questions appropriately, good eye contact   Assessment: Encounter Diagnoses  Name Primary?  Naval architect Anxiety Yes  . Snoring   . Sleep disturbance   . Allergic rhinitis due to pollen, unspecified seasonality      Plan: Anxiety-referral through [facility] for counseling.  Stop Effexor and change to sertraline.  Discussed risk and benefits of medicine, proper use of medicine.  Despite 9 months of being on Effexor does not like it is helps all that well.  She does have a hormonal menstrual component of her anxiety.  Glad to hear she has started Zumba and getting activity and exercise.  Discussed other coping mechanisms.   Allergic rhinitis and asthma-continue Singulair, refilled Singulair and Nasacort.  She will use Benadryl the next few nights to to help with sleep and allergies.  Can continue over-the-counter daily antihistamine such as Zyrtec or Benadryl  Insomnia, sleep disturbance, snoring-anxiety component.  Use Benadryl the next few nights.  Discussed sleep hygiene.  Consider sleep specialist referral going forward if not seeing improvements with medication changes as above   Marie Harper was seen today for allergies and anxiety.  Diagnoses and all orders for this visit:  Anxiety  Snoring  Sleep disturbance  Allergic rhinitis due to pollen, unspecified seasonality  Other orders -     sertraline (ZOLOFT) 50 MG tablet; Take 1 tablet (50  mg total) by mouth daily. -     montelukast (SINGULAIR) 10 MG tablet; Take 1 tablet (10 mg total) by mouth at bedtime. -     triamcinolone (NASACORT) 55 MCG/ACT AERO nasal inhaler; Place 2 sprays into the nose daily.    Follow up: 2 weeks

## 2020-06-12 DIAGNOSIS — E559 Vitamin D deficiency, unspecified: Secondary | ICD-10-CM | POA: Diagnosis not present

## 2020-06-12 DIAGNOSIS — M542 Cervicalgia: Secondary | ICD-10-CM | POA: Diagnosis not present

## 2020-06-12 DIAGNOSIS — E538 Deficiency of other specified B group vitamins: Secondary | ICD-10-CM | POA: Diagnosis not present

## 2020-06-12 DIAGNOSIS — M25532 Pain in left wrist: Secondary | ICD-10-CM | POA: Diagnosis not present

## 2020-06-12 DIAGNOSIS — G43019 Migraine without aura, intractable, without status migrainosus: Secondary | ICD-10-CM | POA: Diagnosis not present

## 2020-06-25 ENCOUNTER — Other Ambulatory Visit: Payer: Self-pay | Admitting: Medical

## 2020-06-29 ENCOUNTER — Other Ambulatory Visit: Payer: Self-pay | Admitting: Nurse Practitioner

## 2020-06-29 DIAGNOSIS — F419 Anxiety disorder, unspecified: Secondary | ICD-10-CM

## 2020-06-29 DIAGNOSIS — N951 Menopausal and female climacteric states: Secondary | ICD-10-CM

## 2020-07-04 DIAGNOSIS — G8929 Other chronic pain: Secondary | ICD-10-CM | POA: Diagnosis not present

## 2020-07-04 DIAGNOSIS — M79645 Pain in left finger(s): Secondary | ICD-10-CM | POA: Diagnosis not present

## 2020-07-04 DIAGNOSIS — M1812 Unilateral primary osteoarthritis of first carpometacarpal joint, left hand: Secondary | ICD-10-CM | POA: Diagnosis not present

## 2020-07-14 ENCOUNTER — Telehealth: Payer: Self-pay | Admitting: Medical

## 2020-07-14 NOTE — Telephone Encounter (Signed)
Requested records received from [facility].

## 2020-07-15 ENCOUNTER — Encounter: Payer: Self-pay | Admitting: Medical

## 2020-07-21 ENCOUNTER — Encounter: Payer: Self-pay | Admitting: Nurse Practitioner

## 2020-07-23 ENCOUNTER — Ambulatory Visit (INDEPENDENT_AMBULATORY_CARE_PROVIDER_SITE_OTHER): Payer: 59 | Admitting: Medical

## 2020-07-23 ENCOUNTER — Other Ambulatory Visit: Payer: Self-pay

## 2020-07-23 VITALS — BP 120/70 | HR 67 | Temp 98.4°F | Wt 194.0 lb

## 2020-07-23 DIAGNOSIS — N898 Other specified noninflammatory disorders of vagina: Secondary | ICD-10-CM | POA: Diagnosis not present

## 2020-07-23 DIAGNOSIS — Z792 Long term (current) use of antibiotics: Secondary | ICD-10-CM

## 2020-07-23 DIAGNOSIS — F419 Anxiety disorder, unspecified: Secondary | ICD-10-CM

## 2020-07-23 DIAGNOSIS — B373 Candidiasis of vulva and vagina: Secondary | ICD-10-CM

## 2020-07-23 DIAGNOSIS — B3731 Acute candidiasis of vulva and vagina: Secondary | ICD-10-CM

## 2020-07-23 DIAGNOSIS — L709 Acne, unspecified: Secondary | ICD-10-CM

## 2020-07-23 LAB — POCT WET PREP (WET MOUNT)
Clue Cells Wet Prep Whiff POC: NEGATIVE
Trichomonas Wet Prep HPF POC: ABSENT

## 2020-07-23 MED ORDER — FLUCONAZOLE 150 MG PO TABS
150.0000 mg | ORAL_TABLET | Freq: Once | ORAL | 0 refills | Status: DC
Start: 2020-07-23 — End: 2020-12-15

## 2020-07-23 NOTE — Progress Notes (Signed)
Subjective:  Marie Harper is a 45 y.o. female who presents for Chief Complaint  Patient presents with  . Vaginitis    Yeast infection the last 3 days. Burning. Discharge, no odor.      Here for yeast infection.  She has been seeing dermatology and just finished a round of doxycycline.  Recently was on doxycycline to help skin clear.  She will likely need to continue doxycycline either daily or for pulse dosing for 7 to 10 days at a time per dermatology.    She notes 3 days of vaginal itching, white cottage cheese discharge.   Gets this occasional on prior antibiotics.  Just finished a course of doxycycline.   OTC monistat cream burns.  Not married.  Last sexual activity 7-8 months ago.  Prior to 3 days ago, last vaginal symptoms 1-2 years ago.   No concern for STD.  She has had BV in the past but this doesn't feel that way.    Has had some recent change in body wash   She notes since last visit she establish with counseling and psychiatry.  She did start the sertraline but this was changed by the psychiatrist to Lexapro.  She has only been on this a few weeks.  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  Seeing Dr. Danie [facility], counselor Lanney Gins,  No other aggravating or relieving factors.    No other c/o.  The following portions of the patient's history were reviewed and updated as appropriate: allergies, current medications, past family history, past medical history, past social history, past surgical history and problem list.  ROS Otherwise as in subjective above  Objective: BP 120/70   Pulse 67   Temp 98.4 F (36.9 C)   Wt 194 lb (88 kg)   BMI 32.28 kg/m   General appearance: alert, no distress, well developed, well nourished Gyn: declined.  Self wet prep done Psych: Pleasant, answers questions appropriately   Assessment: Encounter Diagnoses  Name Primary?  . Vaginal discharge Yes  . Yeast vaginitis   . Antibiotic long-term use    . Acne, unspecified acne type   . Anxiety      Plan: Begin Diflucan for yeast infection.  Yeast infection likely secondary to recent antibiotic use.  However if she is going to be on doxycycline regularly for acne we may have to come up with some other plan if she continues to get yeast vaginitis.  I advise she discuss this with her dermatologist, possibly trying topical BenzaClin or other  Acne-managed by dermatology, currently doxycycline pulse dosing  Anxiety-I am glad to hear she established with psychiatry and counseling  [name] was seen today for vaginitis.  Diagnoses and all orders for this visit:  Vaginal discharge -     POCT Wet Prep [facility])  Yeast vaginitis  Antibiotic long-term use  Acne, unspecified acne type  Anxiety  Other orders -     fluconazole (DIFLUCAN) 150 MG tablet; Take 1 tablet (150 mg total) by mouth once for 1 dose. Weekly until resolved    Follow up: prn

## 2020-07-24 DIAGNOSIS — B3731 Acute candidiasis of vulva and vagina: Secondary | ICD-10-CM | POA: Insufficient documentation

## 2020-07-24 DIAGNOSIS — L709 Acne, unspecified: Secondary | ICD-10-CM | POA: Insufficient documentation

## 2020-07-24 DIAGNOSIS — B373 Candidiasis of vulva and vagina: Secondary | ICD-10-CM | POA: Insufficient documentation

## 2020-07-24 DIAGNOSIS — N898 Other specified noninflammatory disorders of vagina: Secondary | ICD-10-CM | POA: Insufficient documentation

## 2020-07-24 DIAGNOSIS — Z792 Long term (current) use of antibiotics: Secondary | ICD-10-CM | POA: Insufficient documentation

## 2020-09-15 ENCOUNTER — Other Ambulatory Visit: Payer: Self-pay | Admitting: Internal Medicine

## 2020-09-15 ENCOUNTER — Other Ambulatory Visit: Payer: Self-pay | Admitting: Medical

## 2020-09-15 MED ORDER — ESCITALOPRAM OXALATE 10 MG PO TABS: 10.0000 mg | ORAL_TABLET | Freq: Every day | ORAL | 0 refills | Status: DC

## 2020-09-23 ENCOUNTER — Encounter: Payer: Self-pay | Admitting: Medical

## 2020-09-23 ENCOUNTER — Other Ambulatory Visit: Payer: Self-pay

## 2020-09-23 ENCOUNTER — Ambulatory Visit (INDEPENDENT_AMBULATORY_CARE_PROVIDER_SITE_OTHER): Payer: 59 | Admitting: Medical

## 2020-09-23 VITALS — BP 130/80 | HR 64 | Wt 191.0 lb

## 2020-09-23 DIAGNOSIS — H538 Other visual disturbances: Secondary | ICD-10-CM

## 2020-09-23 DIAGNOSIS — R400 Somnolence: Secondary | ICD-10-CM | POA: Insufficient documentation

## 2020-09-23 DIAGNOSIS — R0683 Snoring: Secondary | ICD-10-CM | POA: Diagnosis not present

## 2020-09-23 DIAGNOSIS — R3589 Other polyuria: Secondary | ICD-10-CM | POA: Diagnosis not present

## 2020-09-23 DIAGNOSIS — R609 Edema, unspecified: Secondary | ICD-10-CM | POA: Insufficient documentation

## 2020-09-23 NOTE — Progress Notes (Signed)
Subjective:  Marie Harper is a 45 y.o. female who presents for Chief Complaint  Patient presents with   swelling    Swelling all over. Started last weekend. Started with ankles. Pain with legs when touching. BP has been elevated     Here for concerns.  Prior to last weekend was in normal state of health.     Last weekend was hanging out with family due to moms birthday.  Had crab legs and some other foods including sausage.  Started noticing swelling in legs and arms seemed to be swollen as well by Saturday and over next few days.   Still feels swollen today but not as bad as last weekend.  Been feeling fatigued.  Eyes have been a little blurry.  Usually BP 116/80 range.   But last week started to get some readings like 138/92 BP by Monday.      No hx/o hypertension.  No polyuria.    No other aggravating or relieving factors.    No other c/o.  Past Medical History:  Diagnosis Date   Anemia    Headache(784.0)    has not had migraine in "long time"   Current Outpatient Medications on File Prior to Visit  Medication Sig Dispense Refill   albuterol (PROVENTIL HFA;VENTOLIN HFA) 108 (90 Base) MCG/ACT inhaler INHALE 1-2 PUFFS INTO THE LUNGS EVERY 6 HRS AS NEEDED FOR WHEEZING 8.5 Inhaler 1   budesonide-formoterol (SYMBICORT) 160-4.5 MCG/ACT inhaler TAKE 2 PUFFS FIRST THING IN THE MORNING AND THEN ANOTHER 2 PUFFS 12 HOURS LATER. 30.6 each 1   doxycycline (VIBRAMYCIN) 100 MG capsule Take 100 mg by mouth 2 (two) times daily.     ELIDEL 1 % cream Apply to the face once daily     escitalopram (LEXAPRO) 10 MG tablet Take 1 tablet (10 mg total) by mouth daily. 90 tablet 0   famotidine (PEPCID) 20 MG tablet One twice daily after meals 180 tablet 3   fluocinonide ointment (LIDEX) 0.05 % Apply topically.     fluticasone (CUTIVATE) 0.05 % cream SMARTSIG:1 Topical Daily PRN     ibuprofen (ADVIL) 600 MG tablet Take 1 tablet (600 mg total) by mouth every 6 (six) hours as needed. 30 tablet 0    levonorgestrel (MIRENA, 52 MG,) 20 MCG/24HR IUD Mirena 20 mcg/24 hours (7 yrs) 52 mg intrauterine device  Take 1 device by intrauterine route.     LINZESS 145 MCG CAPS capsule Take 145 mcg by mouth daily.     meloxicam (MOBIC) 15 MG tablet Take 1 tablet by mouth daily.     montelukast (SINGULAIR) 10 MG tablet Take 1 tablet (10 mg total) by mouth at bedtime. 90 tablet 3   tretinoin (RETIN-A) 0.025 % cream Apply a pea size amount to entire face nightly     triamcinolone (NASACORT) 55 MCG/ACT AERO nasal inhaler Place 2 sprays into the nose daily. 1 each 5   baclofen (LIORESAL) 10 MG tablet Take by mouth. (Patient not taking: Reported on 09/23/2020)     No current facility-administered medications on file prior to visit.   The following portions of the patient's history were reviewed and updated as appropriate: allergies, current medications, past family history, past medical history, past social history, past surgical history and problem list.  ROS Otherwise as in subjective above    Objective: BP 130/80   Pulse 64   Wt 191 lb (86.6 kg)   SpO2 99%   BMI 31.78 kg/m   Wt Readings from Last 3  Encounters:  09/23/20 191 lb (86.6 kg)  07/23/20 194 lb (88 kg)  06/03/20 188 lb (85.3 kg)   BP Readings from Last 3 Encounters:  09/23/20 130/80  07/23/20 120/70  04/11/20 122/80     General appearance: alert, no distress, well developed, well nourished HEENT: normocephalic, sclerae anicteric, conjunctiva pink and moist Neck: supple, no lymphadenopathy, no thyromegaly, no masses, no JVD, no bruits Heart: RRR, normal S1, S2, no murmurs Lungs: CTA bilaterally, no wheezes, rhonchi, or rales Pulses: 2+ radial pulses, 2+ pedal pulses, normal cap refill Ext: no edema     Assessment: Encounter Diagnoses  Name Primary?   Swelling Yes   Polyuria    Blurred vision    Snores    Daytime somnolence      Plan: We discussed her symptoms.  There is no significantly high blood pressure today  on exam.  There is no obvious swelling on exam, no signs of CHF.  She did eat likely some saltier foods last weekend.  This may be the cause of her recent swelling.  We will check a basic metabolic lab today.  I asked her to start checking her blood pressures at home  Limit salt, exercise regular.  We also discussed possibility of getting a sleep study.  She does have some snoring and some symptoms of sleep apnea.  She will let me know if she wants to pursue the sleep study  Marie Harper was seen today for swelling.  Diagnoses and all orders for this visit:  Swelling -     Basic metabolic panel  Polyuria -     Basic metabolic panel  Blurred vision -     Basic metabolic panel  Snores  Daytime somnolence   Follow up: pending labs

## 2020-09-24 DIAGNOSIS — R0683 Snoring: Secondary | ICD-10-CM

## 2020-09-24 DIAGNOSIS — G479 Sleep disorder, unspecified: Secondary | ICD-10-CM

## 2020-09-24 DIAGNOSIS — R400 Somnolence: Secondary | ICD-10-CM

## 2020-09-24 LAB — BASIC METABOLIC PANEL
BUN/Creatinine Ratio: 13 (ref 9–23)
BUN: 10 mg/dL (ref 6–24)
CO2: 22 mmol/L (ref 20–29)
Calcium: 9.7 mg/dL (ref 8.7–10.2)
Chloride: 103 mmol/L (ref 96–106)
Creatinine, Ser: 0.75 mg/dL (ref 0.57–1.00)
Glucose: 91 mg/dL (ref 65–99)
Potassium: 4.4 mmol/L (ref 3.5–5.2)
Sodium: 138 mmol/L (ref 134–144)
eGFR: 101 mL/min/{1.73_m2} (ref >59)

## 2020-09-27 ENCOUNTER — Other Ambulatory Visit: Payer: Self-pay | Admitting: Internal Medicine

## 2020-10-01 ENCOUNTER — Other Ambulatory Visit: Payer: Self-pay | Admitting: Internal Medicine

## 2020-10-14 ENCOUNTER — Telehealth: Payer: Self-pay | Admitting: Medical

## 2020-10-14 NOTE — Telephone Encounter (Signed)
Pt called she is taking a class at [facility] and it requires a 12 panel drug test and she wants to know if she can get it done here.

## 2020-10-15 ENCOUNTER — Other Ambulatory Visit: Payer: 59

## 2020-10-15 DIAGNOSIS — Z021 Encounter for pre-employment examination: Secondary | ICD-10-CM

## 2020-10-15 NOTE — Telephone Encounter (Signed)
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 I am not sure what dx code to put.

## 2020-10-16 ENCOUNTER — Other Ambulatory Visit: Payer: Self-pay | Admitting: Medical

## 2020-10-16 ENCOUNTER — Other Ambulatory Visit: Payer: Self-pay

## 2020-10-16 ENCOUNTER — Ambulatory Visit
Admission: RE | Admit: 2020-10-16 | Discharge: 2020-10-16 | Disposition: A | Discharge status: Home / Self Care | Payer: 59 | Source: Ambulatory Visit | Attending: Family Medicine | Admitting: Family Medicine

## 2020-10-16 ENCOUNTER — Encounter: Payer: Self-pay | Admitting: Internal Medicine

## 2020-10-16 DIAGNOSIS — Z1231 Encounter for screening mammogram for malignant neoplasm of breast: Secondary | ICD-10-CM

## 2020-10-16 LAB — DRUG SCREEN 12+ALCOHOL+CRT, UR
Amphetamines, Urine: NEGATIVE ng/mL
BENZODIAZ UR QL: NEGATIVE ng/mL
Barbiturate: NEGATIVE ng/mL
Cannabinoids: NEGATIVE ng/mL
Cocaine (Metabolite): NEGATIVE ng/mL
Creatinine, Urine: 22.9 mg/dL (ref 20.0–300.0)
Ethanol, Urine: NEGATIVE %
Meperidine: NEGATIVE ng/mL
Methadone: NEGATIVE ng/mL
OPIATE SCREEN URINE: NEGATIVE ng/mL
Oxycodone/Oxymorphone, Urine: NEGATIVE ng/mL
Phencyclidine: NEGATIVE ng/mL
Propoxyphene: NEGATIVE ng/mL
Tramadol: NEGATIVE ng/mL

## 2020-10-16 MED ORDER — VORTIOXETINE HBR 10 MG PO TABS: 10.0000 mg | ORAL_TABLET | Freq: Every day | ORAL | 0 refills | Status: DC

## 2020-10-22 ENCOUNTER — Encounter: Payer: Self-pay | Admitting: Internal Medicine

## 2020-10-29 ENCOUNTER — Other Ambulatory Visit: Payer: Self-pay | Admitting: Medical

## 2020-10-29 ENCOUNTER — Other Ambulatory Visit: Payer: Self-pay

## 2020-10-29 ENCOUNTER — Telehealth: Payer: Self-pay | Admitting: Medical

## 2020-10-29 ENCOUNTER — Other Ambulatory Visit: Payer: 59

## 2020-10-29 DIAGNOSIS — Z111 Encounter for screening for respiratory tuberculosis: Secondary | ICD-10-CM

## 2020-10-29 NOTE — Telephone Encounter (Signed)
Pt called and states that she needs PPD test for her job. I told her we do labs and I would send Vincenza Hews a message. Please place labs orders and advise pt at [phone].

## 2020-11-01 LAB — QUANTIFERON-TB GOLD PLUS
QuantiFERON Mitogen Value: >10 IU/mL
QuantiFERON Nil Value: 0.04 IU/mL
QuantiFERON TB1 Ag Value: 0.02 IU/mL
QuantiFERON TB2 Ag Value: 0.03 IU/mL
QuantiFERON-TB Gold Plus: NEGATIVE

## 2020-11-19 ENCOUNTER — Ambulatory Visit (HOSPITAL_BASED_OUTPATIENT_CLINIC_OR_DEPARTMENT_OTHER): Payer: 59 | Attending: Medical | Admitting: Internal Medicine

## 2020-11-19 VITALS — Ht 65.0 in | Wt 191.0 lb

## 2020-11-19 DIAGNOSIS — R0683 Snoring: Secondary | ICD-10-CM | POA: Diagnosis not present

## 2020-11-19 DIAGNOSIS — R400 Somnolence: Secondary | ICD-10-CM | POA: Diagnosis not present

## 2020-11-19 DIAGNOSIS — G479 Sleep disorder, unspecified: Secondary | ICD-10-CM

## 2020-11-19 DIAGNOSIS — G4733 Obstructive sleep apnea (adult) (pediatric): Secondary | ICD-10-CM | POA: Diagnosis not present

## 2020-11-29 DIAGNOSIS — R400 Somnolence: Secondary | ICD-10-CM

## 2020-11-29 NOTE — Procedures (Signed)
    Patient Name: Marie Harper, Marie Harper Date: 11/19/2020 Gender: Female D.O.B: 05/25/1975 Age (years): 44 Referring Provider: Crosby Oyster Height (inches): 65 Interpreting Physician: Jetty Duhamel MD, ABSM Weight (lbs): 191 RPSGT: Screven Sink BMI: 32 MRN: [id number]  CLINICAL INFORMATION Sleep Study Type: HST Indication for sleep study: Snoring Epworth Sleepiness Score: 10  SLEEP STUDY TECHNIQUE A multi-channel overnight portable sleep study was performed. The channels recorded were: nasal airflow, thoracic respiratory movement, and oxygen saturation with a pulse oximetry. Snoring was also monitored.  MEDICATIONS Patient self administered medications include: none reported.  SLEEP ARCHITECTURE Patient was studied for 417.9 minutes. The sleep efficiency was 100.0 % and the patient was supine for 0%. The arousal index was 0.0 per hour.  RESPIRATORY PARAMETERS The overall AHI was 6.0 per hour, with a central apnea index of 0 per hour. The oxygen nadir was 82% during sleep.  CARDIAC DATA Mean heart rate during sleep was 75.4 bpm.  IMPRESSIONS - Mild obstructive sleep apnea occurred during this study (AHI = 6.0/h). - Moderate oxygen desaturation was noted during this study (Min O2 = 82%). Mean O2 saturation 96%. - Patient snored.  DIAGNOSIS - Obstructive Sleep Apnea (G47.33)  RECOMMENDATIONS - Treatment for mild OSA is directed by symptoms and co-morbidities.  Conservative measures including observation, weight loss and sleep position off back may be sufficient. - Avoid alcohol, sedatives and other CNS depressants that may worsen sleep apnea and disrupt normal sleep architecture. - Sleep hygiene should be reviewed to assess factors that may improve sleep quality. - Weight management and regular exercise should be initiated or continued.  [Electronically signed] 11/29/2020 11:07 AM  Jetty Duhamel MD, ABSM Diplomate, American Board of Sleep Medicine   NPI:  [id number]                         Jetty Duhamel Diplomate, American Board of Sleep Medicine  ELECTRONICALLY SIGNED ON:  11/29/2020, 11:03 AM [facility] PH: [phone]   FX: [phone] ACCREDITED BY THE AMERICAN ACADEMY OF SLEEP MEDICINE

## 2020-12-02 ENCOUNTER — Encounter: Payer: Self-pay | Admitting: Internal Medicine

## 2020-12-04 ENCOUNTER — Other Ambulatory Visit: Payer: Self-pay | Admitting: Medical

## 2020-12-04 ENCOUNTER — Telehealth: Payer: Self-pay

## 2020-12-04 MED ORDER — TEMAZEPAM 7.5 MG PO CAPS: 7.5000 mg | ORAL_CAPSULE | Freq: Every evening | ORAL | 0 refills | Status: DC | PRN

## 2020-12-04 NOTE — Telephone Encounter (Signed)
Pt. Called stating she needed a Varicella vaccine for school and wanted to know if she could get it here. I told here I would have to get the ok from you first.

## 2020-12-05 ENCOUNTER — Other Ambulatory Visit: Payer: Self-pay

## 2020-12-05 ENCOUNTER — Other Ambulatory Visit: Payer: 59

## 2020-12-05 DIAGNOSIS — Z789 Other specified health status: Secondary | ICD-10-CM

## 2020-12-05 NOTE — Telephone Encounter (Signed)
I called pt. Back to schedule nurse visit for varicella vaccine that she needs for school starting next week. She then stated if she could get the titer blood work instead of getting the shot. I told her it would have to be approved by the provider on hand today which is Dr. Susann Givens. I did schedule her this afternoon and she said she would get the shot if the lab orders were not put in before she came in.

## 2020-12-08 ENCOUNTER — Other Ambulatory Visit (INDEPENDENT_AMBULATORY_CARE_PROVIDER_SITE_OTHER): Payer: 59

## 2020-12-08 ENCOUNTER — Other Ambulatory Visit: Payer: Self-pay

## 2020-12-08 DIAGNOSIS — Z23 Encounter for immunization: Secondary | ICD-10-CM

## 2020-12-08 LAB — VARICELLA ZOSTER ABS, IGG/IGM
Varicella IgM: <0.91 index (ref 0.00–0.90)
Varicella zoster IgG: 713 index (ref >165)

## 2020-12-11 ENCOUNTER — Other Ambulatory Visit: Payer: Self-pay | Admitting: Medical

## 2020-12-12 ENCOUNTER — Other Ambulatory Visit: Payer: Self-pay | Admitting: Internal Medicine

## 2020-12-12 ENCOUNTER — Telehealth: Payer: Self-pay | Admitting: Medical

## 2020-12-12 ENCOUNTER — Other Ambulatory Visit: Payer: Self-pay | Admitting: Medical

## 2020-12-12 MED ORDER — ESCITALOPRAM OXALATE 10 MG PO TABS: 10.0000 mg | ORAL_TABLET | Freq: Every day | ORAL | 0 refills | Status: DC

## 2020-12-12 NOTE — Telephone Encounter (Signed)
I received a refill request for Lexapro.  It took me about 10 minutes to go back through all the patient messages and phone call messages to trace back what the status was.  She was on Lexapro then was changed to Trintellix.  She refused to come in for an appointment in September to recheck on antidepressant.  She was in the process of trying to switch psychiatrist but the 1 she was seeing left the practice.  So it was my understanding that she was going to begin Trintellix as an alternate to Lexapro and then follow-up.  At a subsequent patient message in early October recently she said she would follow-up in a month.  Then today I got a refill request on Lexapro.   So please clarify, if she actually taking Trintellix or Lexapro?  Does she have enough Trintellix to last 2 to 3 weeks or not?    Please make sure she is on the schedule within the next 30 days for a med check on these medications

## 2020-12-12 NOTE — Telephone Encounter (Signed)
Pt never started on Trintellix. She is coming in next week to discuss options. She is currently still on lexapro

## 2020-12-13 ENCOUNTER — Other Ambulatory Visit: Payer: Self-pay | Admitting: Medical

## 2020-12-17 ENCOUNTER — Other Ambulatory Visit: Payer: Self-pay

## 2020-12-17 ENCOUNTER — Ambulatory Visit (INDEPENDENT_AMBULATORY_CARE_PROVIDER_SITE_OTHER): Payer: 59 | Admitting: Medical

## 2020-12-17 VITALS — BP 120/72 | HR 71 | Wt 194.4 lb

## 2020-12-17 DIAGNOSIS — R4589 Other symptoms and signs involving emotional state: Secondary | ICD-10-CM | POA: Diagnosis not present

## 2020-12-17 DIAGNOSIS — T50905A Adverse effect of unspecified drugs, medicaments and biological substances, initial encounter: Secondary | ICD-10-CM

## 2020-12-17 DIAGNOSIS — F411 Generalized anxiety disorder: Secondary | ICD-10-CM

## 2020-12-17 MED ORDER — BUPROPION HCL ER (XL) 150 MG PO TB24: 150.0000 mg | ORAL_TABLET | Freq: Every day | ORAL | 1 refills | Status: DC

## 2020-12-17 NOTE — Progress Notes (Signed)
Subjective:  Marie Harper is a 45 y.o. female who presents for Chief Complaint  Patient presents with   discuss depression    Discuss depression medication. Sent in Trinellix but never took it due to psych doctor being gone and is still taking lexapro but having sexual side effects, not sure if medication needs to be increased     Here for follow-up on depression and anxiety.  She notes ongoing symptoms of feeling stressed, anxious feelings, at times mood is down, does not feel like having a lot of interest in things at times, has trouble sleeping, can be irritable and snappy at people.  However compared to several months ago, the Lexapro 10 mg has been helpful.  In the past she did not get a good response with Effexor.  She was on fluoxetine at 1 point but does not think it helped that much.  Lexapro helps that she feels significantly improved from months ago but feels like the medicine could be stronger.  However she has sexual side effects of not being able to achieve orgasm.  She would like to try different medication.  We had talked about Trintellix before but she never started this.  For a brief period of time in the last several months she was seeing counseling and psychiatry through [facility], but she could not get them to call her back to schedule any appointments.  Just got to be a significant problem going back and forth on the phone without any availability to schedule.  The psychiatrist that was through this company ended up leaving.    Thus, she wants to have her medications refilled here.  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  She is also in school [employer], learning sterile processing.  Her 50-year-old daughter lives with her.  Exercise - not much.  Her classes when in November which will free up some time and less stress  She denies hallucinations, delusions, no hearing voices, no HI or SI   The only family history of mental health issue is paternal  aunt with depression  No other aggravating or relieving factors.    No other c/o.  Past Medical History:  Diagnosis Date   Anemia    Headache(784.0)    has not had migraine in "long time"   Current Outpatient Medications on File Prior to Visit  Medication Sig Dispense Refill   albuterol (VENTOLIN HFA) 108 (90 Base) MCG/ACT inhaler INHALE 1-2 PUFFS INTO THE LUNGS EVERY 6 HRS AS NEEDED FOR WHEEZING 8.5 each 1   budesonide-formoterol (SYMBICORT) 160-4.5 MCG/ACT inhaler TAKE 2 PUFFS FIRST THING IN THE MORNING AND THEN ANOTHER 2 PUFFS 12 HOURS LATER. 30.6 each 1   cyclobenzaprine (FLEXERIL) 5 MG tablet TAKE 1 TABLET (5 MG TOTAL) BY MOUTH 3 TIMES/DAY AS NEEDED-BETWEEN MEALS & BEDTIME FOR MUSCLE SPASMS. 12 tablet 0   ELIDEL 1 % cream Apply to the face once daily     famotidine (PEPCID) 20 MG tablet One twice daily after meals 180 tablet 3   fluocinonide ointment (LIDEX) 0.05 % Apply topically.     fluticasone (CUTIVATE) 0.05 % cream SMARTSIG:1 Topical Daily PRN     levonorgestrel (MIRENA, 52 MG,) 20 MCG/24HR IUD Mirena 20 mcg/24 hours (7 yrs) 52 mg intrauterine device  Take 1 device by intrauterine route.     LINZESS 145 MCG CAPS capsule Take 145 mcg by mouth daily.     montelukast (SINGULAIR) 10 MG tablet Take 1 tablet (10 mg total) by  mouth at bedtime. 90 tablet 3   temazepam (RESTORIL) 7.5 MG capsule Take 1 capsule (7.5 mg total) by mouth at bedtime as needed for sleep. 15 capsule 0   tretinoin (RETIN-A) 0.025 % cream Apply a pea size amount to entire face nightly     triamcinolone (NASACORT) 55 MCG/ACT AERO nasal inhaler Place 2 sprays into the nose daily. (Patient not taking: Reported on 12/17/2020) 1 each 5   No current facility-administered medications on file prior to visit.     The following portions of the patient's history were reviewed and updated as appropriate: allergies, current medications, past family history, past medical history, past social history, past surgical history  and problem list.  ROS Otherwise as in subjective above  Objective: BP 120/72   Pulse 71   Wt 194 lb 6.4 oz (88.2 kg)   BMI 32.35 kg/m   General appearance: alert, no distress, well developed, well nourished Psych: Pleasant, good eye contact, answers questions appropriately  PHQ-9 and GAD-7 reviewed, both with mild scores.    Assessment: Encounter Diagnoses  Name Primary?   Generalized anxiety disorder Yes   Depressed mood    Adverse effect of drug, initial encounter      Plan: We discussed her symptoms, concerns, prior evaluations, prior treatments. Stop Lexapro and begin trial of Wellbutrin.  Discussed risk, benefits, and proper use of medication I have recommended she continue with counseling.  This could be through referral or she can find her own counselor.  She will consider  Consider cutting Singulair in half short-term.  Sometimes Singulair has been known to cause some mental health changes.  We discussed ways to deal with stress and anxiety. I recommend regular exercise such as 30 minutes or more most days of the week such as walking running and bicycling I recommend taking some time to meditate or pray daily to help slow racing thoughts. I recommend working on relaxation techniques such as deep breathing exercises in a comfortable position relaxing your body.  There are free Apps on the smart phone for this for example Consider getting a massage Journal or use diary to express your ideas on paper to cope with anxiety and stress Work on time management, use a calendar or plan out things to avoid stressing about things. Find ways to utilize your time to include exercise and personal "me" time. Some people use aromatherapy such as lavender to relax Some people use herbal teas to help calm their mood Spend some time with animals or your pet if you have one Consider seeing a counselor to help deal with anxiety and work on specific techniques   Cathern was seen  today for discuss depression.  Diagnoses and all orders for this visit:  Generalized anxiety disorder  Depressed mood  Adverse effect of drug, initial encounter  Other orders -     buPROPion (WELLBUTRIN XL) 150 MG 24 hr tablet; Take 1 tablet (150 mg total) by mouth daily.   Follow up: 75mo

## 2020-12-18 DIAGNOSIS — F411 Generalized anxiety disorder: Secondary | ICD-10-CM | POA: Insufficient documentation

## 2020-12-18 DIAGNOSIS — T50905A Adverse effect of unspecified drugs, medicaments and biological substances, initial encounter: Secondary | ICD-10-CM | POA: Insufficient documentation

## 2020-12-18 DIAGNOSIS — R4589 Other symptoms and signs involving emotional state: Secondary | ICD-10-CM | POA: Insufficient documentation

## 2021-01-08 ENCOUNTER — Other Ambulatory Visit: Payer: Self-pay | Admitting: Medical

## 2021-01-19 ENCOUNTER — Other Ambulatory Visit: Payer: Self-pay | Admitting: Medical

## 2021-01-22 ENCOUNTER — Other Ambulatory Visit: Payer: Self-pay | Admitting: Medical

## 2021-01-22 ENCOUNTER — Telehealth: Payer: Self-pay

## 2021-01-22 MED ORDER — IBUPROFEN 600 MG PO TABS: 600.0000 mg | ORAL_TABLET | Freq: Four times a day (QID) | ORAL | 0 refills | Status: DC | PRN

## 2021-01-22 NOTE — Telephone Encounter (Signed)
Received fax from [facility] for a refill on the pts. Ibuprofen 800mg  pt. Last apt was 12/17/20.

## 2021-01-29 DIAGNOSIS — L501 Idiopathic urticaria: Secondary | ICD-10-CM | POA: Diagnosis not present

## 2021-04-07 DIAGNOSIS — M1812 Unilateral primary osteoarthritis of first carpometacarpal joint, left hand: Secondary | ICD-10-CM | POA: Diagnosis not present

## 2021-04-07 DIAGNOSIS — M65312 Trigger thumb, left thumb: Secondary | ICD-10-CM | POA: Diagnosis not present

## 2021-04-10 ENCOUNTER — Ambulatory Visit: Payer: 59 | Admitting: Internal Medicine

## 2021-04-20 ENCOUNTER — Other Ambulatory Visit: Payer: Self-pay

## 2021-04-20 ENCOUNTER — Encounter: Payer: Self-pay | Admitting: Internal Medicine

## 2021-04-20 ENCOUNTER — Ambulatory Visit (INDEPENDENT_AMBULATORY_CARE_PROVIDER_SITE_OTHER): Payer: 59 | Admitting: Internal Medicine

## 2021-04-20 DIAGNOSIS — R058 Other specified cough: Secondary | ICD-10-CM | POA: Diagnosis not present

## 2021-04-20 DIAGNOSIS — J45991 Cough variant asthma: Secondary | ICD-10-CM

## 2021-04-20 MED ORDER — BUDESONIDE-FORMOTEROL FUMARATE 160-4.5 MCG/ACT IN AERO: INHALATION_SPRAY | RESPIRATORY_TRACT | 1 refills | Status: DC

## 2021-04-20 MED ORDER — ALBUTEROL SULFATE HFA 108 (90 BASE) MCG/ACT IN AERS: 1.0000 | INHALATION_SPRAY | RESPIRATORY_TRACT | 1 refills | Status: DC | PRN

## 2021-04-20 NOTE — Progress Notes (Signed)
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 no problems as child or young adult with tendency to bad head colds since early 2014 with Sinus xray neg 08/28/12 then abrupt onset  cough / wheeze /doe then Fall 2014 p developed sneezing/ head congestion  dx'd as sinusitis at [facility] on [address]  rx abx then worse again in November 2014 progressively worse  Despite rx and referred to Pulmonary clinic 06/28/13 by Marie Harper p admit     Admit date: 06/23/2013  Discharge date: 06/25/2013  Discharge Diagnoses:  CAP (community acquired pneumonia)  Anemia  Bronchospasm         06/27/2013 1st Marie Harper Pulmonary office visit/ Marie Harper  Chief Complaint  Patient presents with   Pulmonary Consult    Self referral- pt c/o cough and SOB since Nov 2014. Worse for the past month with recent hospitalization for PNA. Cough is prod with minimal clear sputum.   cough much worse when lie down with freq awakening > clear mucus was thick and green  , now clear saba helps wheeze, not really helping the cough, not used since May 2 - not really sob unless coughing rec Pantoprazole (protonix) 40 mg   Take 30-60 min before first meal of the day and Pepcid 20 mg one bedtime    GERD diet   Dulera 100 Take 2 puffs first thing in am and then another 2 puffs about 12 hours later.  Prednisone 10 mg take  4 each am x 2 days,   2 each am x 2 days,  1 each am x 2 days and stop Use the cough medication   If not improving call [phone] for sinus CT scan before next visit.       04/20/2021  yearly f/u ov/Marie Harper re: cough variant asthma   maint on symbicort 160 2bid  / pepcid 20mg  p bfast  Chief Complaint  Patient presents with   Follow-up   Dyspnea:  back to baseline  Cough: non  Sleeping: bed blocks  SABA use: none  02: none  Covid status:  vax x 2    No obvious day to day or daytime variability or assoc excess/  purulent sputum or mucus plugs or hemoptysis or cp or chest tightness, subjective wheeze or overt sinus or hb symptoms.   sleeping without nocturnal  or early am exacerbation  of respiratory  c/o's or need for noct saba. Also denies any obvious fluctuation of symptoms with weather or environmental changes or other aggravating or alleviating factors except as outlined above   No unusual exposure hx or h/o childhood pna/ asthma or knowledge of premature birth.  Current Allergies, Complete Past Medical History, Past Surgical History, Family History, and Social History were reviewed in [facility] record.  ROS  The following are not active complaints unless bolded Hoarseness, sore throat, dysphagia, dental problems, itching, sneezing,  nasal congestion or discharge of excess mucus or purulent secretions, ear ache,   fever, chills, sweats, unintended wt loss or wt gain, classically pleuritic or exertional cp,  orthopnea pnd or arm/hand swelling  or leg swelling, presyncope, palpitations, abdominal pain, anorexia, nausea, vomiting, diarrhea  or change in bowel habits or change in bladder habits, change in stools or change in urine, dysuria, hematuria,  rash, arthralgias, visual complaints, headache, numbness, weakness or ataxia or problems with walking or coordination,  change in mood or  memory.        Current Meds  Medication Sig   albuterol (VENTOLIN HFA) 108 (90 Base) MCG/ACT inhaler INHALE 1-2 PUFFS INTO THE LUNGS EVERY 6 HRS AS NEEDED FOR WHEEZING   budesonide-formoterol (SYMBICORT) 160-4.5 MCG/ACT inhaler TAKE 2 PUFFS FIRST THING IN THE MORNING AND THEN ANOTHER 2 PUFFS 12 HOURS LATER.   buPROPion (WELLBUTRIN XL) 150 MG 24 hr tablet TAKE 1 TABLET BY MOUTH EVERY DAY   cyclobenzaprine (FLEXERIL) 5 MG tablet TAKE 1 TABLET (5 MG TOTAL) BY MOUTH 3 TIMES/DAY AS NEEDED-BETWEEN MEALS & BEDTIME FOR MUSCLE SPASMS.   ELIDEL 1 % cream Apply to the face once daily   famotidine (PEPCID)  20 MG tablet One twice daily after meals   fluocinonide ointment (LIDEX) 0.05 % Apply topically.   fluticasone (CUTIVATE) 0.05 % cream SMARTSIG:1 Topical Daily PRN   ibuprofen (ADVIL) 600 MG tablet Take 1 tablet (600 mg total) by mouth every 6 (six) hours as needed.   levonorgestrel (MIRENA, 52 MG,) 20 MCG/24HR IUD Mirena 20 mcg/24 hours (7 yrs) 52 mg intrauterine device  Take 1 device by intrauterine route.   LINZESS 145 MCG CAPS capsule Take 145 mcg by mouth daily.   montelukast (SINGULAIR) 10 MG tablet Take 1 tablet (10 mg total) by mouth at bedtime.   temazepam (RESTORIL) 7.5 MG capsule TAKE 1 CAPSULE (7.5 MG TOTAL) BY MOUTH AT BEDTIME AS NEEDED FOR SLEEP.   tretinoin (RETIN-A) 0.025 % cream Apply a pea size amount to entire face nightly   triamcinolone (NASACORT) 55 MCG/ACT AERO nasal inhaler Place 2 sprays into the nose daily.              Objective:   Physical Exam  Wt    04/20/2021  199  04/11/2020   190  10/09/2019    190  04/10/2019    187  07/11/2013    172> 08/22/2013 176 > 09/11/2013  177 > 10/09/2013  173 > 01/07/2014 173 > 06/06/2014  174 > 12/03/2014   180 >  12/02/2016     180 > 05/30/2017  183  > 11/22/2017 175 > 02/28/2018   182  > 04/11/2018   181 > 04/27/2018  181     06/27/13 171 lb 12.8 oz (77.928 kg)  06/23/13 170 lb 13.7 oz (77.5 kg)  11/13/11 197 lb (89.359 kg)      Vital signs reviewed  04/20/2021  - Note at rest 02 sats  99% on RA   General appearance:    amb bf nad   HEENT : pt wearing mask not removed for exam due to covid -19 concerns.    NECK :  without JVD/Nodes/TM/ nl carotid upstrokes bilaterally   LUNGS: no acc muscle use,  Nl contour chest which is clear to A and P bilaterally without cough on insp or exp maneuvers   CV:  RRR  no s3 or murmur or increase in P2, and no edema   ABD:  soft and nontender with nl inspiratory excursion in the supine position. No bruits or organomegaly appreciated, bowel sounds nl  MS:  Nl gait/ ext warm without  deformities, calf tenderness, cyanosis or clubbing No obvious joint restrictions   SKIN: warm and dry without lesions    NEURO:  alert, approp, nl sensorium with  no motor or cerebellar deficits apparent.                   Assessment & Plan:

## 2021-04-20 NOTE — Patient Instructions (Addendum)
For flare of coughing for any reason > Try prilosec otc 20mg   Take 30-60 min before first meal of the day and Pepcid ac (famotidine) 20 mg one @  bedtime until better for at least a week  then back to pepcid 20 mg after bfast    Please schedule a follow up visit in 12 months but call sooner if needed

## 2021-04-21 ENCOUNTER — Encounter: Payer: Self-pay | Admitting: Internal Medicine

## 2021-04-21 NOTE — Assessment & Plan Note (Signed)
-   Max acid suppression started 06/27/13 > modified to h2 bid 10/09/2013  - sinus CT neg 07/24/13  - trial of chlortrimeton started 09/11/2013 >> - Allergy profile 11/22/17 >  Eos 0.4 /  IgE  29  RAST neg  - Mar 13, 2018  EGD > pos medium sized HH    Advised for cough for any reason needs max short term gerd rx to prevent cyclical cough then ok with me to go back to maint h2          Each maintenance medication was reviewed in detail including emphasizing most importantly the difference between maintenance and prns and under what circumstances the prns are to be triggered using an action plan format where appropriate.  Total time for H and P, chart review, counseling, reviewing hfa  device(s) and generating customized AVS unique to this office visit / same day charting = 28 min

## 2021-04-21 NOTE — Assessment & Plan Note (Signed)
Onset ? 2014 assoc with rhinitis  - started on dulera 100 2bid 06/27/13> better 07/11/2013  - 07/24/13 Sinus CT > Slight mucosal thickening in the right maxillary sinus. Remainder of the paranasal sinuses are clear. No air-fluid levels. No acute bony abnormality. Orbital soft tissues are unremarkable. - spirometry  08/22/2013 min airflow obst reflected in fef 25-75 despite prominent "wheeze" on exam - 09/11/13 increased dulera to 200 2bid due to concern not using the dulera frequently enough -  12/02/2016  extensive coaching HFA effectiveness =75% from  A baseline of 50%     -  Spirometry 12/02/2016  FEV1 2.50 (93%)  Ratio 74 off all rx, minimal curvature   - 11/22/2017   continue symb 80 - Allergy profile 11/22/17 >  Eos 0.4 /  IgE  29  RAST neg  - FENO 04/11/2018  =   69 p symb 80 2bid prior to study during flare of asthma  - 04/11/2018    increased symb to 160 2bid and added singulair > much better 04/27/2018  - 04/27/2018  After extensive coaching inhaler device,  effectiveness =    90%> continue symb 160 2bid/ singulair    All goals of chronic asthma control met including optimal function and elimination of symptoms with minimal need for rescue therapy.  Contingencies discussed in full including contacting this office immediately if not controlling the symptoms using the rule of two's.     

## 2021-06-05 ENCOUNTER — Other Ambulatory Visit: Payer: Self-pay | Admitting: Medical

## 2021-06-05 ENCOUNTER — Other Ambulatory Visit: Payer: Self-pay | Admitting: Physician Assistant

## 2021-06-05 DIAGNOSIS — F411 Generalized anxiety disorder: Secondary | ICD-10-CM

## 2021-06-05 MED ORDER — TEMAZEPAM 7.5 MG PO CAPS: 7.5000 mg | ORAL_CAPSULE | Freq: Every evening | ORAL | 0 refills | Status: DC | PRN

## 2021-07-16 ENCOUNTER — Telehealth: Payer: Self-pay | Admitting: Internal Medicine

## 2021-07-16 NOTE — Telephone Encounter (Signed)
Patient dropped FMLA paperwork off in [employer]. office - due date 07/21/21.  I called her and she said this is something Dr. Sherene Sires has filled out for her in the past - it is to continue her FMLA coverage.  I let her know we will not have the form completed by 5/30 because Dr. Sherene Sires will not be back in the office until 6/5.  Also advised her of $29 fee.  She stated she understood and submitting after 5/30 will be ok.  I cannot find a copy of previous FMLA paperwork in the patient's chart, will need to discuss form with Dr. Sherene Sires before he completes it.

## 2021-07-19 ENCOUNTER — Other Ambulatory Visit: Payer: Self-pay | Admitting: Medical

## 2021-07-24 NOTE — Telephone Encounter (Signed)
I called patient and she said form needs to be for Chronic Condition, 2-3 episodes/year lasting 2-3 days.  No work restrictions.  I filled in form and gave to Dr. Melvyn Novas for review and signature.  Patient requested I call her to pick the form up and she will take it to her HR Dept.  She is aware of $29 fee.

## 2021-07-29 DIAGNOSIS — Z0289 Encounter for other administrative examinations: Secondary | ICD-10-CM

## 2021-07-29 NOTE — Telephone Encounter (Signed)
Dr. Sherene Sires signed FMLA form - I called and left voice message for patient to pick it up at front desk of [place] office.  Also reminded her of the $29 fee.

## 2021-08-03 ENCOUNTER — Other Ambulatory Visit: Payer: Self-pay | Admitting: Medical

## 2021-08-11 ENCOUNTER — Other Ambulatory Visit: Payer: Self-pay | Admitting: Medical

## 2021-08-12 NOTE — Telephone Encounter (Signed)
Pt. Request refill on Ibuprofen last apt 12/17/20.

## 2021-10-19 ENCOUNTER — Other Ambulatory Visit: Payer: Self-pay | Admitting: Physician Assistant

## 2021-10-20 NOTE — Telephone Encounter (Signed)
Is this okay to refill? 

## 2021-10-28 ENCOUNTER — Encounter: Payer: Self-pay | Admitting: Internal Medicine

## 2021-12-01 ENCOUNTER — Encounter: Payer: Self-pay | Admitting: Internal Medicine

## 2021-12-11 ENCOUNTER — Ambulatory Visit: Payer: 59 | Admitting: Medical

## 2021-12-11 VITALS — BP 110/70 | HR 68 | Wt 188.4 lb

## 2021-12-11 DIAGNOSIS — M25551 Pain in right hip: Secondary | ICD-10-CM | POA: Diagnosis not present

## 2021-12-11 DIAGNOSIS — M25552 Pain in left hip: Secondary | ICD-10-CM

## 2021-12-11 DIAGNOSIS — M549 Dorsalgia, unspecified: Secondary | ICD-10-CM | POA: Diagnosis not present

## 2021-12-11 DIAGNOSIS — M542 Cervicalgia: Secondary | ICD-10-CM | POA: Diagnosis not present

## 2021-12-11 DIAGNOSIS — M25561 Pain in right knee: Secondary | ICD-10-CM

## 2021-12-11 MED ORDER — TIZANIDINE HCL 4 MG PO TABS: 4.0000 mg | ORAL_TABLET | Freq: Two times a day (BID) | ORAL | 0 refills | Status: AC | PRN
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
## 2021-12-14 ENCOUNTER — Other Ambulatory Visit: Payer: Self-pay | Admitting: Medical

## 2021-12-14 NOTE — Telephone Encounter (Signed)
Pt was just seen but I see she has a new pcp for January. I will refill til then

## 2021-12-19 ENCOUNTER — Other Ambulatory Visit: Payer: Self-pay | Admitting: Medical

## 2022-01-27 ENCOUNTER — Other Ambulatory Visit: Payer: Self-pay | Admitting: Medical

## 2022-01-27 DIAGNOSIS — Z1231 Encounter for screening mammogram for malignant neoplasm of breast: Secondary | ICD-10-CM

## 2022-02-25 ENCOUNTER — Encounter: Payer: Self-pay | Admitting: Nurse Practitioner

## 2022-02-25 ENCOUNTER — Ambulatory Visit: Payer: 59 | Admitting: Nurse Practitioner

## 2022-02-25 VITALS — BP 130/70 | HR 73 | Temp 98.2°F | Ht 65.0 in | Wt 193.0 lb

## 2022-02-25 DIAGNOSIS — G4486 Cervicogenic headache: Secondary | ICD-10-CM

## 2022-02-25 DIAGNOSIS — J45991 Cough variant asthma: Secondary | ICD-10-CM | POA: Diagnosis not present

## 2022-02-25 DIAGNOSIS — E6609 Other obesity due to excess calories: Secondary | ICD-10-CM

## 2022-02-25 DIAGNOSIS — F419 Anxiety disorder, unspecified: Secondary | ICD-10-CM | POA: Diagnosis not present

## 2022-02-25 DIAGNOSIS — Z1159 Encounter for screening for other viral diseases: Secondary | ICD-10-CM

## 2022-02-25 DIAGNOSIS — Z862 Personal history of diseases of the blood and blood-forming organs and certain disorders involving the immune mechanism: Secondary | ICD-10-CM

## 2022-02-25 DIAGNOSIS — Z6832 Body mass index (BMI) 32.0-32.9, adult: Secondary | ICD-10-CM

## 2022-02-25 DIAGNOSIS — Z13228 Encounter for screening for other metabolic disorders: Secondary | ICD-10-CM

## 2022-02-25 MED ORDER — SAXENDA 18 MG/3ML ~~LOC~~ SOPN: 3.0000 mg | PEN_INJECTOR | Freq: Every day | SUBCUTANEOUS | 1 refills | Status: DC

## 2022-02-25 MED ORDER — SUMATRIPTAN SUCCINATE 50 MG PO TABS: 50.0000 mg | ORAL_TABLET | Freq: Once | ORAL | 2 refills | Status: DC | PRN
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
## 2022-02-25 NOTE — Patient Instructions (Addendum)
Obesity, Adult ?Obesity is having too much body fat. Being obese means that your weight is more than what is healthy for you.  ?BMI (body mass index) is a number that explains how much body fat you have. If you have a BMI of 30 or more, you are obese. ?Obesity can cause serious health problems, such as: ?Stroke. ?Coronary artery disease (CAD). ?Type 2 diabetes. ?Some types of cancer. ?High blood pressure (hypertension). ?High cholesterol. ?Gallbladder stones. ?Obesity can also contribute to: ?Osteoarthritis. ?Sleep apnea. ?Infertility problems. ?What are the causes? ?Eating meals each day that are high in calories, sugar, and fat. ?Drinking a lot of drinks that have sugar in them. ?Being born with genes that may make you more likely to become obese. ?Having a medical condition that causes obesity. ?Taking certain medicines. ?Sitting a lot (having a sedentary lifestyle). ?Not getting enough sleep. ?What increases the risk? ?Having a family history of obesity. ?Living in an area with limited access to: ?Parks, recreation centers, or sidewalks. ?Healthy food choices, such as grocery stores and farmers' markets. ?What are the signs or symptoms? ?The main sign is having too much body fat. ?How is this treated? ?Treatment for this condition often includes changing your lifestyle. Treatment may include: ?Changing your diet. This may include making a healthy meal plan. ?Exercise. This may include activity that causes your heart to beat faster (aerobic exercise) and strength training. Work with your doctor to design a program that works for you. ?Medicine to help you lose weight. This may be used if you are not able to lose one pound a week after 6 weeks of healthy eating and more exercise. ?Treating conditions that cause the obesity. ?Surgery. Options may include gastric banding and gastric bypass. This may be done if: ?Other treatments have not helped to improve your condition. ?You have a BMI of 40 or higher. ?You have  life-threatening health problems related to obesity. ?Follow these instructions at home: ?Eating and drinking ? ?Follow advice from your doctor about what to eat and drink. Your doctor may tell you to: ?Limit fast food, sweets, and processed snack foods. ?Choose low-fat options. For example, choose low-fat milk instead of whole milk. ?Eat five or more servings of fruits or vegetables each day. ?Eat at home more often. This gives you more control over what you eat. ?Choose healthy foods when you eat out. ?Learn to read food labels. This will help you learn how much food is in one serving. ?Keep low-fat snacks available. ?Avoid drinks that have a lot of sugar in them. These include soda, fruit juice, iced tea with sugar, and flavored milk. ?Drink enough water to keep your pee (urine) pale yellow. ?Do not go on fad diets. ?Physical activity ?Exercise often, as told by your doctor. Most adults should get up to 150 minutes of moderate-intensity exercise every week.Ask your doctor: ?What types of exercise are safe for you. ?How often you should exercise. ?Warm up and stretch before being active. ?Do slow stretching after being active (cool down). ?Rest between times of being active. ?Lifestyle ?Work with your doctor and a food expert (dietitian) to set a weight-loss goal that is best for you. ?Limit your screen time. ?Find ways to reward yourself that do not involve food. ?Do not drink alcohol if: ?Your doctor tells you not to drink. ?You are pregnant, may be pregnant, or are planning to become pregnant. ?If you drink alcohol: ?Limit how much you have to: ?0-1 drink a day for women. ?0-2 drinks   a day for men. Know how much alcohol is in your drink. In the [place] one drink equals one 12 oz bottle of beer (355 mL), one 5 oz glass of wine (148 mL), or one 1 oz glass of hard liquor (44 mL). General instructions Keep a weight-loss journal. This can help you keep track of: The food that you eat. How much exercise you  get. Take over-the-counter and prescription medicines only as told by your doctor. Take vitamins and supplements only as told by your doctor. Think about joining a support group. Pay attention to your mental health as obesity can lead to depression or self esteem issues. Keep all follow-up visits. Contact a doctor if: You cannot meet your weight-loss goal after you have changed your diet and lifestyle for 6 weeks. You are having trouble breathing. Summary Obesity is having too much body fat. Being obese means that your weight is more than what is healthy for you. Work with your doctor to set a weight-loss goal. Get regular exercise as told by your doctor. This information is not intended to replace advice given to you by your health care provider. Make sure you discuss any questions you have with your health care provider. Document Revised: 09/16/2020 Document Reviewed: 09/16/2020 Elsevier Patient Education  2023 Elsevier Inc.   Liraglutide Injection (Weight Management) What is this medication? LIRAGLUTIDE (LIR a GLOO tide) promotes weight loss. It may also be used to maintain weight loss. It works by decreasing appetite. Changes to diet and exercise are often combined with this medication. This medicine may be used for other purposes; ask your health care provider or pharmacist if you have questions. COMMON BRAND NAME(S): Saxenda What should I tell my care team before I take this medication? They need to know if you have any of these conditions: Endocrine tumors (MEN 2) or if someone in your family had these tumors Gallbladder disease High cholesterol History of pancreatitis Kidney disease or if you are on dialysis Liver disease Previous swelling of the tongue, face, or lips with difficulty breathing, difficulty swallowing, hoarseness, or tightening of the throat Stomach problems Substance use disorder Suicidal thoughts, plans, or attempt by you or a family member Thyroid cancer  or if someone in your family had thyroid cancer An unusual or allergic reaction to liraglutide, other medications, foods, dyes, or preservatives Pregnant or trying to get pregnant Breast-feeding How should I use this medication? This medication is for injection under the skin of your upper leg, stomach area, or upper arm. You will be taught how to prepare and give this medication. Use exactly as directed. Take your medication at regular intervals. Do not take it more often than directed. This medication comes with INSTRUCTIONS FOR USE. Ask your pharmacist for directions on how to use this medication. Read the information carefully. Talk to your pharmacist or care team if you have questions. It is important that you put your used needles and syringes in a special sharps container. Do not put them in a trash can. If you do not have a sharps container, call your pharmacist or care team to get one. A special MedGuide will be given to you by the pharmacist with each prescription and refill. Be sure to read this information carefully each time. Talk to your care team about the use of this medication in children. While it may be prescribed for children as young as 32 years of age for selected conditions, precautions do apply. Overdosage: If you think you have taken too  much of this medicine contact a poison control center or emergency room at once. NOTE: This medicine is only for you. Do not share this medicine with others. What if I miss a dose? If you miss a dose, take it as soon as you can. If it is almost time for your next dose, take only that dose. Do not take double or extra doses. If you miss your dose for 3 days or more, call your care team to talk about how to restart this medicine. What may interact with this medication? Insulin and other medications for diabetes This list may not describe all possible interactions. Give your health care provider a list of all the medicines, herbs,  non-prescription drugs, or dietary supplements you use. Also tell them if you smoke, drink alcohol, or use illegal drugs. Some items may interact with your medicine. What should I watch for while using this medication? Visit your care team for regular checks on your progress. Drink plenty of fluids while taking this medication. Check with your care team if you get an attack of severe diarrhea, nausea, and vomiting. The loss of too much body fluid can make it dangerous for you to take this medication. This medication may affect blood sugar levels. Ask your care team if changes in diet or medications are needed if you have diabetes. Patients and their families should watch out for worsening depression or thoughts of suicide. Also watch out for sudden changes in feelings such as feeling anxious, agitated, panicky, irritable, hostile, aggressive, impulsive, severely restless, overly excited and hyperactive, or not being able to sleep. If this happens, especially at the beginning of treatment or after a change in dose, call your care team. Talk to your care team if you wish to become pregnant or think you might be pregnant. Losing weight while pregnant is not advised and may cause harm to the unborn child. Talk to your care team for more information. What side effects may I notice from receiving this medication? Side effects that you should report to your care team as soon as possible: Allergic reactions or angioedema--skin rash, itching, hives, swelling of the face, eyes, lips, tongue, arms, or legs, trouble swallowing or breathing Fast or irregular heartbeat Gallbladder problems--severe stomach pain, nausea, vomiting, fever Kidney injury--decrease in the amount of urine, swelling of the ankles, hands, or feet Pancreatitis--severe stomach pain that spreads to your back or gets worse after eating or when touched, fever, nausea, vomiting Thoughts of suicide or self-harm, worsening mood, feelings of  depression Thyroid cancer--new mass or lump in the neck, pain or trouble swallowing, trouble breathing, hoarseness Side effects that usually do not require medical attention (report to your care team if they continue or are bothersome): Constipation Dizziness Fatigue Headache Loss of Appetite Nausea Upset stomach This list may not describe all possible side effects. Call your doctor for medical advice about side effects. You may report side effects to FDA at 1-800-FDA-1088. Where should I keep my medication? Keep out of the reach of children and pets. Store unopened pen in a refrigerator between 2 and 8 degrees C (36 and 46 degrees F). Do not freeze or use if the medication has been frozen. Protect from light and excessive heat. After you first use the pen, it can be stored at room temperature between 15 and 30 degrees C (59 and 86 degrees F) or in a refrigerator. Throw away your used pen after 30 days or after the expiration date, whichever comes first. Do not  store your pen with the needle attached. If the needle is left on, medication may leak from the pen. NOTE: This sheet is a summary. It may not cover all possible information. If you have questions about this medicine, talk to your doctor, pharmacist, or health care provider.  2023 Elsevier/Gold Standard (2020-03-14 00:00:00)

## 2022-02-26 LAB — LIPID PANEL
Chol/HDL Ratio: 4 ratio (ref 0.0–4.4)
Cholesterol, Total: 169 mg/dL (ref 100–199)
HDL: 42 mg/dL (ref >39)
LDL Chol Calc (NIH): 86 mg/dL (ref 0–99)
Triglycerides: 243 mg/dL — ABNORMAL HIGH (ref 0–149)
VLDL Cholesterol Cal: 41 mg/dL — ABNORMAL HIGH (ref 5–40)

## 2022-02-26 LAB — CMP14+EGFR
ALT: 22 IU/L (ref 0–32)
AST: 18 IU/L (ref 0–40)
Albumin/Globulin Ratio: 1.6 (ref 1.2–2.2)
Albumin: 4.4 g/dL (ref 3.9–4.9)
Alkaline Phosphatase: 37 IU/L — ABNORMAL LOW (ref 44–121)
BUN/Creatinine Ratio: 19 (ref 9–23)
BUN: 14 mg/dL (ref 6–24)
Bilirubin Total: 0.2 mg/dL (ref 0.0–1.2)
CO2: 22 mmol/L (ref 20–29)
Calcium: 9.3 mg/dL (ref 8.7–10.2)
Chloride: 102 mmol/L (ref 96–106)
Creatinine, Ser: 0.74 mg/dL (ref 0.57–1.00)
Globulin, Total: 2.7 g/dL (ref 1.5–4.5)
Glucose: 88 mg/dL (ref 70–99)
Potassium: 4.2 mmol/L (ref 3.5–5.2)
Sodium: 137 mmol/L (ref 134–144)
Total Protein: 7.1 g/dL (ref 6.0–8.5)
eGFR: 102 mL/min/{1.73_m2} (ref >59)

## 2022-02-26 LAB — CBC
Hematocrit: 36.1 % (ref 34.0–46.6)
Hemoglobin: 12 g/dL (ref 11.1–15.9)
MCH: 27.3 pg (ref 26.6–33.0)
MCHC: 33.2 g/dL (ref 31.5–35.7)
MCV: 82 fL (ref 79–97)
Platelets: 184 10*3/uL (ref 150–450)
RBC: 4.4 x10E6/uL (ref 3.77–5.28)
RDW: 13.8 % (ref 11.7–15.4)
WBC: 8.4 10*3/uL (ref 3.4–10.8)

## 2022-02-26 LAB — HEMOGLOBIN A1C
Est. average glucose Bld gHb Est-mCnc: 126 mg/dL
Hgb A1c MFr Bld: 6 % — ABNORMAL HIGH (ref 4.8–5.6)

## 2022-02-26 LAB — INSULIN, RANDOM: INSULIN: 31.5 u[IU]/mL — ABNORMAL HIGH (ref 2.6–24.9)

## 2022-02-26 LAB — TSH: TSH: 1.21 u[IU]/mL (ref 0.450–4.500)

## 2022-03-02 ENCOUNTER — Encounter: Payer: Self-pay | Admitting: Nurse Practitioner

## 2022-03-05 ENCOUNTER — Telehealth: Payer: Self-pay

## 2022-03-05 NOTE — Telephone Encounter (Signed)
Looked at rx benefits  

## 2022-03-10 ENCOUNTER — Other Ambulatory Visit (HOSPITAL_COMMUNITY): Payer: Self-pay

## 2022-03-10 ENCOUNTER — Other Ambulatory Visit: Payer: Self-pay | Admitting: Nurse Practitioner

## 2022-03-10 DIAGNOSIS — E6609 Other obesity due to excess calories: Secondary | ICD-10-CM

## 2022-03-10 MED ORDER — SAXENDA 18 MG/3ML ~~LOC~~ SOPN
3.0000 mg | PEN_INJECTOR | Freq: Every day | SUBCUTANEOUS | 1 refills | Status: DC
  Filled 2022-03-10 – 2022-03-12 (×2): qty 15, 30d supply, fill #0

## 2022-03-11 ENCOUNTER — Other Ambulatory Visit (HOSPITAL_COMMUNITY): Payer: Self-pay

## 2022-03-12 ENCOUNTER — Other Ambulatory Visit (HOSPITAL_COMMUNITY): Payer: Self-pay

## 2022-03-13 ENCOUNTER — Other Ambulatory Visit: Payer: Self-pay | Admitting: Internal Medicine

## 2022-03-13 ENCOUNTER — Other Ambulatory Visit: Payer: Self-pay | Admitting: Medical

## 2022-03-16 ENCOUNTER — Other Ambulatory Visit: Payer: Self-pay | Admitting: Nurse Practitioner

## 2022-03-16 MED ORDER — METFORMIN HCL 500 MG PO TABS: 500.0000 mg | ORAL_TABLET | Freq: Two times a day (BID) | ORAL | 1 refills | Status: DC

## 2022-03-16 MED ORDER — PHENTERMINE HCL 15 MG PO CAPS: 15.0000 mg | ORAL_CAPSULE | ORAL | 1 refills | Status: DC

## 2022-03-24 ENCOUNTER — Ambulatory Visit
Admission: RE | Admit: 2022-03-24 | Discharge: 2022-03-24 | Disposition: A | Discharge status: Home / Self Care | Payer: 59 | Source: Ambulatory Visit | Attending: Medical | Admitting: Medical

## 2022-03-24 DIAGNOSIS — Z1231 Encounter for screening mammogram for malignant neoplasm of breast: Secondary | ICD-10-CM

## 2022-03-26 NOTE — Progress Notes (Signed)
Results sent through MyChart

## 2022-03-29 ENCOUNTER — Other Ambulatory Visit: Payer: Self-pay | Admitting: Nurse Practitioner

## 2022-03-29 ENCOUNTER — Other Ambulatory Visit (HOSPITAL_COMMUNITY): Payer: Self-pay

## 2022-03-29 ENCOUNTER — Encounter: Payer: Self-pay | Admitting: Nurse Practitioner

## 2022-03-29 DIAGNOSIS — E6609 Other obesity due to excess calories: Secondary | ICD-10-CM

## 2022-03-29 MED ORDER — PHENTERMINE HCL 15 MG PO CAPS
15.0000 mg | ORAL_CAPSULE | ORAL | 1 refills | Status: DC
  Filled 2022-03-29: qty 30, 30d supply, fill #0
  Filled 2022-04-26: qty 30, 30d supply, fill #1

## 2022-03-30 ENCOUNTER — Other Ambulatory Visit (HOSPITAL_COMMUNITY): Payer: Self-pay

## 2022-04-21 ENCOUNTER — Encounter: Payer: Self-pay | Admitting: Nurse Practitioner

## 2022-04-21 ENCOUNTER — Ambulatory Visit: Payer: 59 | Admitting: Nurse Practitioner

## 2022-04-21 ENCOUNTER — Other Ambulatory Visit (HOSPITAL_COMMUNITY)
Admission: RE | Admit: 2022-04-21 | Discharge: 2022-04-21 | Disposition: A | Discharge status: Home / Self Care | Payer: 59 | Source: Ambulatory Visit | Attending: Nurse Practitioner | Admitting: Nurse Practitioner

## 2022-04-21 VITALS — BP 124/82

## 2022-04-21 DIAGNOSIS — Z30431 Encounter for routine checking of intrauterine contraceptive device: Secondary | ICD-10-CM | POA: Diagnosis not present

## 2022-04-21 DIAGNOSIS — Z113 Encounter for screening for infections with a predominantly sexual mode of transmission: Secondary | ICD-10-CM

## 2022-04-21 DIAGNOSIS — Z124 Encounter for screening for malignant neoplasm of cervix: Secondary | ICD-10-CM | POA: Diagnosis not present

## 2022-04-21 DIAGNOSIS — B3731 Acute candidiasis of vulva and vagina: Secondary | ICD-10-CM | POA: Diagnosis not present

## 2022-04-21 DIAGNOSIS — N898 Other specified noninflammatory disorders of vagina: Secondary | ICD-10-CM

## 2022-04-21 LAB — WET PREP FOR TRICH, YEAST, CLUE

## 2022-04-21 MED ORDER — FLUCONAZOLE 150 MG PO TABS: 150.0000 mg | ORAL_TABLET | ORAL | 0 refills | Status: DC

## 2022-04-21 NOTE — Progress Notes (Signed)
   Acute Office Visit  Subjective:    Patient ID: Marie Harper, female    DOB: 08-23-1975, 47 y.o.   MRN: [id number]   HPI 47 y.o. presents today for "lump on cervix" and vaginal discharge. When she went to check IUD strings she felt bump on cervix. Had menses 04/04/22 for about 4 days but since then has had light spotting/brown discharge. No itching or irritation. Sexually active. Mirena IUD inserted 12/2018. Normal pap history. Last pap in 2019. Does doxycycline intermittently for acne.    Review of Systems  Constitutional: Negative.   Genitourinary:  Positive for vaginal discharge. Negative for vaginal pain.       "Bump" on cervix       Objective:    Physical Exam Constitutional:      Appearance: Normal appearance.  Genitourinary:    General: Normal vulva.     Vagina: Normal.     Cervix: Normal.     Uterus: Normal.      Comments: IUD string visible ~3 cm No cervical polyps or lesions present, no vaginal wall cysts present    BP 124/82 (BP Location: Right Arm, Patient Position: Sitting, Cuff Size: Normal)  Wt Readings from Last 3 Encounters:  02/25/22 193 lb (87.5 kg)  12/11/21 188 lb 6.4 oz (85.5 kg)  04/20/21 199 lb 6.4 oz (90.4 kg)        Patient informed chaperone available to be present for breast and/or pelvic exam. Patient has requested no chaperone to be present. Patient has been advised what will be completed during breast and pelvic exam.   Wet prep + yeast  Assessment & Plan:   Problem List Items Addressed This Visit       Other   Vaginal discharge   Relevant Orders   WET PREP FOR [place], YEAST, CLUE   Other Visit Diagnoses     Vaginal candidiasis    -  Primary   Relevant Medications   fluconazole (DIFLUCAN) 150 MG tablet   Screening for cervical cancer       Relevant Orders   Cytology - PAP( [facility])   IUD check up       Screening examination for STD (sexually transmitted disease)       Relevant Orders   Cytology - PAP( [facility])      Plan: Wet prep positive for yeast - Diflucan 150 mg today and repeat in 3 days for total of 2 doses. Normal IUD exam. Pap pending. GC/CT pending.      Tamela Gammon DNP, 3:58 PM 04/21/2022

## 2022-04-26 LAB — CYTOLOGY - PAP
Chlamydia: NEGATIVE
Comment: NEGATIVE
Comment: NEGATIVE
Comment: NORMAL
Diagnosis: NEGATIVE
Diagnosis: REACTIVE
High risk HPV: NEGATIVE
Neisseria Gonorrhea: NEGATIVE

## 2022-04-28 ENCOUNTER — Ambulatory Visit: Payer: 59 | Admitting: Nurse Practitioner

## 2022-04-28 ENCOUNTER — Encounter: Payer: Self-pay | Admitting: Nurse Practitioner

## 2022-04-28 ENCOUNTER — Other Ambulatory Visit (HOSPITAL_COMMUNITY): Payer: Self-pay

## 2022-04-28 VITALS — BP 128/88 | HR 73 | Temp 98.2°F | Ht 65.0 in | Wt 188.0 lb

## 2022-04-28 DIAGNOSIS — Z6831 Body mass index (BMI) 31.0-31.9, adult: Secondary | ICD-10-CM | POA: Diagnosis not present

## 2022-04-28 DIAGNOSIS — E6609 Other obesity due to excess calories: Secondary | ICD-10-CM | POA: Diagnosis not present

## 2022-04-28 DIAGNOSIS — G4709 Other insomnia: Secondary | ICD-10-CM

## 2022-04-28 DIAGNOSIS — E66811 Obesity, class 1: Secondary | ICD-10-CM | POA: Insufficient documentation

## 2022-04-28 DIAGNOSIS — K625 Hemorrhage of anus and rectum: Secondary | ICD-10-CM | POA: Insufficient documentation

## 2022-04-28 DIAGNOSIS — R7303 Prediabetes: Secondary | ICD-10-CM | POA: Diagnosis not present

## 2022-04-28 DIAGNOSIS — N946 Dysmenorrhea, unspecified: Secondary | ICD-10-CM | POA: Insufficient documentation

## 2022-04-28 DIAGNOSIS — R1013 Epigastric pain: Secondary | ICD-10-CM | POA: Insufficient documentation

## 2022-04-28 DIAGNOSIS — R194 Change in bowel habit: Secondary | ICD-10-CM | POA: Insufficient documentation

## 2022-04-28 DIAGNOSIS — K649 Unspecified hemorrhoids: Secondary | ICD-10-CM | POA: Insufficient documentation

## 2022-04-28 DIAGNOSIS — K5904 Chronic idiopathic constipation: Secondary | ICD-10-CM | POA: Insufficient documentation

## 2022-04-28 HISTORY — DX: Hemorrhage of anus and rectum: K62.5

## 2022-04-28 MED ORDER — PHENTERMINE HCL 37.5 MG PO CAPS
37.5000 mg | ORAL_CAPSULE | ORAL | 1 refills | Status: DC
  Filled 2022-04-28: qty 30, 30d supply, fill #0

## 2022-04-28 MED ORDER — HYDROXYZINE HCL 10 MG PO TABS: 10.0000 mg | ORAL_TABLET | Freq: Three times a day (TID) | ORAL | 0 refills | Status: DC | PRN
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
## 2022-05-14 ENCOUNTER — Other Ambulatory Visit: Payer: Self-pay | Admitting: Medical

## 2022-05-18 ENCOUNTER — Other Ambulatory Visit: Payer: Self-pay | Admitting: Medical

## 2022-05-18 NOTE — Telephone Encounter (Signed)
Pt sees another office

## 2022-06-08 ENCOUNTER — Other Ambulatory Visit: Payer: Self-pay | Admitting: Medical

## 2022-06-08 NOTE — Telephone Encounter (Signed)
Is this okay to refill? 

## 2022-06-26 ENCOUNTER — Other Ambulatory Visit: Payer: Self-pay | Admitting: Medical

## 2022-06-28 ENCOUNTER — Telehealth: Payer: Self-pay | Admitting: Nurse Practitioner

## 2022-06-28 NOTE — Telephone Encounter (Signed)
Called pt to reschedule appt no answer left VM  

## 2022-06-29 ENCOUNTER — Encounter: Payer: 59 | Admitting: Nurse Practitioner

## 2022-07-05 ENCOUNTER — Other Ambulatory Visit: Payer: Self-pay | Admitting: Medical

## 2022-07-05 ENCOUNTER — Telehealth: Payer: Self-pay | Admitting: Medical

## 2022-07-05 ENCOUNTER — Other Ambulatory Visit: Payer: Self-pay | Admitting: Internal Medicine

## 2022-07-05 NOTE — Telephone Encounter (Signed)
Marie Harper request for medical records forwarded to [facility]

## 2022-07-07 NOTE — Telephone Encounter (Signed)
Sending to prior auth team to see which inhalers are preferred with insurance.

## 2022-07-08 NOTE — Telephone Encounter (Signed)
Per change request, generic Advair Diskus is preferred

## 2022-08-18 ENCOUNTER — Other Ambulatory Visit: Payer: Self-pay | Admitting: Nurse Practitioner

## 2022-08-18 DIAGNOSIS — B3731 Acute candidiasis of vulva and vagina: Secondary | ICD-10-CM

## 2022-09-06 ENCOUNTER — Other Ambulatory Visit: Payer: Self-pay | Admitting: Nurse Practitioner

## 2022-09-23 ENCOUNTER — Ambulatory Visit (INDEPENDENT_AMBULATORY_CARE_PROVIDER_SITE_OTHER): Payer: 59 | Admitting: Nurse Practitioner

## 2022-09-23 ENCOUNTER — Encounter: Payer: Self-pay | Admitting: Nurse Practitioner

## 2022-09-23 VITALS — BP 124/70 | HR 69 | Temp 98.4°F | Ht 65.0 in | Wt 176.0 lb

## 2022-09-23 DIAGNOSIS — Z0000 Encounter for general adult medical examination without abnormal findings: Secondary | ICD-10-CM

## 2022-09-23 DIAGNOSIS — Z6829 Body mass index (BMI) 29.0-29.9, adult: Secondary | ICD-10-CM

## 2022-09-23 DIAGNOSIS — E663 Overweight: Secondary | ICD-10-CM | POA: Diagnosis not present

## 2022-09-23 DIAGNOSIS — R2231 Localized swelling, mass and lump, right upper limb: Secondary | ICD-10-CM | POA: Diagnosis not present

## 2022-09-23 DIAGNOSIS — R7303 Prediabetes: Secondary | ICD-10-CM | POA: Diagnosis not present

## 2022-09-23 DIAGNOSIS — Z13220 Encounter for screening for lipoid disorders: Secondary | ICD-10-CM

## 2022-09-23 NOTE — Progress Notes (Signed)
Marie Harper, CMA,acting as a Neurosurgeon for Marie Felts, FNP.,have documented all relevant documentation on the behalf of Marie Felts, FNP,as directed by  Marie Felts, FNP while in the presence of Marie Felts, FNP.  Subjective:    Patient ID: Marie Harper , female    DOB: 10/26/1975 , 47 y.o.   MRN: [id number]  Chief Complaint  Patient presents with   Annual Exam    HPI  Patient presents today for a HM, Patient reports compliance with medications. Patient denies any chest pain, SOB, or headaches. Patient is complaining of sharp pain in the left side of her neck. She reported she noticed the pain was worse about a week ago. She is going to [employer].   BP Readings from Last 3 Encounters: 09/23/22 : 124/70 04/28/22 : 128/88 04/21/22 : 124/82   Wt Readings from Last 3 Encounters: 09/23/22 : 176 lb (79.8 kg) 04/28/22 : 188 lb (85.3 kg) 02/25/22 : 193 lb (87.5 kg)       Past Medical History:  Diagnosis Date   Anemia    Headache(784.0)    has not had migraine in "long time"   Hemorrhage of anus and rectum 04/28/2022   Rectal bleeding 04/28/2022     Family History  Problem Relation Age of Onset   Diabetes Mother    Cancer Maternal Aunt        nasal/sinus   Asthma Paternal Aunt    Emphysema Paternal Uncle        smoked   Diabetes Maternal Grandmother    Heart disease Maternal Grandmother    Diabetes Paternal Grandmother    Arthritis Other    Kidney disease Other    Diabetes Other    Anesthesia problems Neg Hx      Current Outpatient Medications:    albuterol (VENTOLIN HFA) 108 (90 Base) MCG/ACT inhaler, INHALE 1-2 PUFFS INTO THE LUNGS EVERY 4 HOURS AS NEEDED FOR WHEEZING OR SHORTNESS OF BREATH., Disp: 8.5 each, Rfl: 1   famotidine (PEPCID) 20 MG tablet, One twice daily after meals, Disp: 180 tablet, Rfl: 3   fluticasone (CUTIVATE) 0.05 % cream, SMARTSIG:1 Topical Daily PRN, Disp: , Rfl:    fluticasone-salmeterol (WIXELA INHUB) 250-50  MCG/ACT AEPB, Inhale 1 puff into the lungs in the morning and at bedtime., Disp: 60 each, Rfl: 5   hydrOXYzine (ATARAX) 10 MG tablet, Take 1 tablet (10 mg total) by mouth 3 (three) times daily as needed., Disp: 30 tablet, Rfl: 0   ibuprofen (ADVIL) 600 MG tablet, TAKE 1 TABLET BY MOUTH EVERY 6 HOURS AS NEEDED, Disp: 30 tablet, Rfl: 0   levonorgestrel (MIRENA, 52 MG,) 20 MCG/24HR IUD, Mirena 20 mcg/24 hours (7 yrs) 52 mg intrauterine device  Take 1 device by intrauterine route., Disp: , Rfl:    LINZESS 145 MCG CAPS capsule, Take 145 mcg by mouth daily., Disp: , Rfl:    montelukast (SINGULAIR) 10 MG tablet, TAKE 1 TABLET BY MOUTH EVERYDAY AT BEDTIME, Disp: 30 tablet, Rfl: 3   Semaglutide-Weight Management 0.25 MG/0.5ML SOAJ, Inject 0.25 mg into the skin once a week., Disp: , Rfl:    SUMAtriptan (IMITREX) 50 MG tablet, Take 1 tablet (50 mg total) by mouth once as needed for migraine. May repeat in 2 hours if headache persists or recurs., Disp: 30 tablet, Rfl: 2   tiZANidine (ZANAFLEX) 4 MG tablet, Take 1 tablet (4 mg total) by mouth 2 (two) times daily as needed for muscle spasms., Disp: 30 tablet, Rfl:  0   tretinoin (RETIN-A) 0.025 % cream, Apply a pea size amount to entire face nightly, Disp: , Rfl:    No Known Allergies    The patient states she uses IUD for birth control. Patient's last menstrual period was 09/17/2022.. Negative for Dysmenorrhea and Negative for Menorrhagia. Negative for: breast discharge, breast lump(s), breast pain and breast self exam. Associated symptoms include abnormal vaginal bleeding. Pertinent negatives include abnormal bleeding (hematology), anxiety, decreased libido, depression, difficulty falling sleep, dyspareunia, history of infertility, nocturia, sexual dysfunction, sleep disturbances, urinary incontinence, urinary urgency, vaginal discharge and vaginal itching. Diet regular; she is eating better. The patient states her exercise level is none, has been released from  PT  The patient's tobacco use is:  Social History   Tobacco Use  Smoking Status Never  Smokeless Tobacco Never   She has been exposed to passive smoke. The patient's alcohol use is:  Social History   Substance and Sexual Activity  Alcohol Use Yes   Alcohol/week: 2.0 standard drinks of alcohol   Types: 2 Shots of liquor per week   Comment: social    Additional information: Last pap 2024, next one scheduled for 2029.    Review of Systems  Constitutional: Negative.   HENT: Negative.    Eyes: Negative.   Respiratory: Negative.    Cardiovascular: Negative.   Gastrointestinal: Negative.   Endocrine: Negative.   Genitourinary: Negative.   Musculoskeletal: Negative.   Skin: Negative.   Allergic/Immunologic: Negative.   Neurological: Negative.   Hematological: Negative.   Psychiatric/Behavioral: Negative.       Today's Vitals   09/23/22 1547  BP: 124/70  Pulse: 69  Temp: 98.4 F (36.9 C)  TempSrc: Oral  Weight: 176 lb (79.8 kg)  Height: 5\' 5"  (1.651 m)  PainSc: 5    Body mass index is 29.29 kg/m.  Wt Readings from Last 3 Encounters:  09/23/22 176 lb (79.8 kg)  04/28/22 188 lb (85.3 kg)  02/25/22 193 lb (87.5 kg)     Objective:  Physical Exam Constitutional:      General: She is not in acute distress.    Appearance: Normal appearance. She is well-developed. She is obese.  HENT:     Head: Normocephalic and atraumatic.     Right Ear: Hearing, tympanic membrane, ear canal and external ear normal. There is no impacted cerumen.     Left Ear: Hearing, tympanic membrane, ear canal and external ear normal. There is no impacted cerumen.     Nose: Nose normal.     Mouth/Throat:     Mouth: Mucous membranes are moist.  Eyes:     General: Lids are normal.     Extraocular Movements: Extraocular movements intact.     Conjunctiva/sclera: Conjunctivae normal.     Pupils: Pupils are equal, round, and reactive to light.     Funduscopic exam:    Right eye: No papilledema.         Left eye: No papilledema.  Neck:     Thyroid: No thyroid mass.     Vascular: No carotid bruit.  Cardiovascular:     Rate and Rhythm: Normal rate and regular rhythm.     Pulses: Normal pulses.     Heart sounds: Normal heart sounds. No murmur heard. Pulmonary:     Effort: Pulmonary effort is normal. No respiratory distress.     Breath sounds: Normal breath sounds. No wheezing.  Chest:     Chest wall: No mass.  Breasts:    Tanner  Score is 5.     Right: Normal. No mass or tenderness.     Left: Normal. No mass or tenderness.     Comments: Lump palpated to right axilla Abdominal:     General: Abdomen is flat. Bowel sounds are normal. There is no distension.     Palpations: Abdomen is soft.     Tenderness: There is no abdominal tenderness.  Genitourinary:    Rectum: Guaiac result negative.  Musculoskeletal:        General: No swelling or tenderness. Normal range of motion.     Cervical back: Full passive range of motion without pain, normal range of motion and neck supple.     Right lower leg: No edema.     Left lower leg: No edema.  Lymphadenopathy:     Upper Body:     Right upper body: No supraclavicular, axillary or pectoral adenopathy.     Left upper body: No supraclavicular, axillary or pectoral adenopathy.  Skin:    General: Skin is warm and dry.     Capillary Refill: Capillary refill takes less than 2 seconds.  Neurological:     General: No focal deficit present.     Mental Status: She is alert and oriented to person, place, and time.     Cranial Nerves: No cranial nerve deficit.     Sensory: No sensory deficit.     Motor: No weakness.  Psychiatric:        Mood and Affect: Mood normal.        Behavior: Behavior normal.        Thought Content: Thought content normal.        Judgment: Judgment normal.         Assessment And Plan:     Encounter for annual health examination Assessment & Plan: Behavior modifications discussed and diet history reviewed.   Pt  will continue to exercise regularly and modify diet with low GI, plant based foods and decrease intake of processed foods.  Recommend intake of daily multivitamin, Vitamin D, and calcium.  Recommend mammogram and colonoscopy for preventive screenings, as well as recommend immunizations that include influenza, TDAP   Orders: -     CBC with Differential/Platelet  Encounter for screening for lipid disorder -     Lipid panel  Overweight with body mass index (BMI) of 29 to 29.9 in adult Assessment & Plan: Weight has improved, congratulated on her 12 pound weight loss.  Currently receiving semaglutide injections at [facility].  Discussed risk of compounding medications   Prediabetes Assessment & Plan: Hemoglobin A1c was elevated at last visit visit started on metformin however not taking at this time.  Orders: -     CMP14+EGFR -     Hemoglobin A1c  Mass of right axilla Assessment & Plan: Palpable lump to right axilla will check an ultrasound  Orders: -     [place] AXILLA RIGHT; Future  Other orders -     Semaglutide-Weight Management; Inject 0.25 mg into the skin once a week.     Return for 1 year physical. Patient was given opportunity to ask questions. Patient verbalized understanding of the plan and was able to repeat key elements of the plan. All questions were answered to their satisfaction.   Marie Felts, FNP   I, Marie Felts, FNP, have reviewed all documentation for this visit. The documentation on 09/23/22 for the exam, diagnosis, procedures, and orders are all accurate and complete.

## 2022-09-28 ENCOUNTER — Other Ambulatory Visit: Payer: Self-pay | Admitting: Nurse Practitioner

## 2022-09-28 ENCOUNTER — Encounter: Payer: Self-pay | Admitting: Nurse Practitioner

## 2022-09-28 DIAGNOSIS — R2231 Localized swelling, mass and lump, right upper limb: Secondary | ICD-10-CM

## 2022-09-29 DIAGNOSIS — Z0000 Encounter for general adult medical examination without abnormal findings: Secondary | ICD-10-CM | POA: Insufficient documentation

## 2022-09-29 DIAGNOSIS — R2231 Localized swelling, mass and lump, right upper limb: Secondary | ICD-10-CM | POA: Insufficient documentation

## 2022-09-29 DIAGNOSIS — E663 Overweight: Secondary | ICD-10-CM | POA: Insufficient documentation

## 2022-09-29 MED ORDER — SEMAGLUTIDE-WEIGHT MANAGEMENT 0.25 MG/0.5ML ~~LOC~~ SOAJ: 0.2500 mg | SUBCUTANEOUS | Status: DC

## 2022-09-29 NOTE — Assessment & Plan Note (Signed)
Hemoglobin A1c was elevated at last visit visit started on metformin however not taking at this time.

## 2022-09-29 NOTE — Assessment & Plan Note (Signed)
Palpable lump to right axilla will check an ultrasound

## 2022-09-29 NOTE — Assessment & Plan Note (Addendum)
Weight has improved, congratulated on her 12 pound weight loss.  Currently receiving semaglutide injections at [facility].  Discussed risk of compounding medications

## 2022-09-29 NOTE — Assessment & Plan Note (Signed)

## 2022-10-20 ENCOUNTER — Ambulatory Visit: Admission: RE | Admit: 2022-10-20 | Payer: 59 | Source: Ambulatory Visit

## 2022-10-20 ENCOUNTER — Ambulatory Visit
Admission: RE | Admit: 2022-10-20 | Discharge: 2022-10-20 | Disposition: A | Discharge status: Home / Self Care | Payer: 59 | Source: Ambulatory Visit | Attending: Nurse Practitioner | Admitting: Nurse Practitioner

## 2022-10-20 DIAGNOSIS — R2231 Localized swelling, mass and lump, right upper limb: Secondary | ICD-10-CM

## 2022-10-27 ENCOUNTER — Encounter: Payer: Self-pay | Admitting: Nurse Practitioner

## 2022-10-27 NOTE — Telephone Encounter (Signed)
Recommend OV. Thanks.

## 2022-10-27 NOTE — Telephone Encounter (Signed)
Pt scheduled for 11/03/2022. Routing to provider for final review.

## 2022-10-27 NOTE — Telephone Encounter (Signed)
Msg sent to appt desk.  

## 2022-10-28 ENCOUNTER — Ambulatory Visit (INDEPENDENT_AMBULATORY_CARE_PROVIDER_SITE_OTHER): Payer: 59 | Admitting: Nurse Practitioner

## 2022-10-28 ENCOUNTER — Encounter: Payer: Self-pay | Admitting: Nurse Practitioner

## 2022-10-28 VITALS — BP 124/70 | HR 77 | Wt 173.0 lb

## 2022-10-28 DIAGNOSIS — Z113 Encounter for screening for infections with a predominantly sexual mode of transmission: Secondary | ICD-10-CM | POA: Diagnosis not present

## 2022-10-28 DIAGNOSIS — N921 Excessive and frequent menstruation with irregular cycle: Secondary | ICD-10-CM

## 2022-10-28 DIAGNOSIS — N898 Other specified noninflammatory disorders of vagina: Secondary | ICD-10-CM

## 2022-10-28 DIAGNOSIS — Z975 Presence of (intrauterine) contraceptive device: Secondary | ICD-10-CM

## 2022-10-28 DIAGNOSIS — N926 Irregular menstruation, unspecified: Secondary | ICD-10-CM

## 2022-10-28 NOTE — Progress Notes (Signed)
   Acute Office Visit  Subjective:    Patient ID: Marie Harper, female    DOB: [birth year], 47 y.o.   MRN: [id number]   HPI 47 y.o. G1P1001 presents today for vaginal bleeding with IUD. Had period-like bleeding 7/15-7/18, then 7/24-7/29, then 8/25-8/30 and again 9/3. Spotting today. Mirena IUD 12/2018. No pelvic pain. Sexually active. Denies vaginal symptoms.   Patient's last menstrual period was 10/17/2022.    Review of Systems  Constitutional: Negative.   Genitourinary:  Positive for menstrual problem.       Objective:    Physical Exam Constitutional:      Appearance: Normal appearance.  Genitourinary:    General: Normal vulva.     Vagina: Vaginal discharge and bleeding present. No erythema.     Cervix: Normal.     Comments: IUD strings visible. Very small amount of brown discharge present    BP 124/70   Pulse 77   Wt 173 lb (78.5 kg)   LMP 10/17/2022   SpO2 100%   BMI 28.79 kg/m  Wt Readings from Last 3 Encounters:  10/28/22 173 lb (78.5 kg)  09/23/22 176 lb (79.8 kg)  04/28/22 188 lb (85.3 kg)        Patient informed chaperone available to be present for breast and/or pelvic exam. Patient has requested no chaperone to be present. Patient has been advised what will be completed during breast and pelvic exam.   Assessment & Plan:   Problem List Items Addressed This Visit   None Visit Diagnoses     Menstrual changes    -  Primary   Relevant Orders   TSH   Screening examination for STD (sexually transmitted disease)       Relevant Orders   SureSwab Advanced Vaginitis Plus,TMA   Vaginal discharge       Relevant Orders   SureSwab Advanced Vaginitis Plus,TMA   Breakthrough bleeding with IUD          Plan: Normal IUD exam. Vaginitis panel, TSH pending. If all normal will schedule ultrasound.      Olivia Mackie DNP, 4:17 PM 10/28/2022

## 2022-10-29 LAB — SURESWAB® ADVANCED VAGINITIS PLUS,TMA
C. trachomatis RNA, TMA: NOT DETECTED
CANDIDA SPECIES: DETECTED — AB
Candida glabrata: NOT DETECTED
N. gonorrhoeae RNA, TMA: NOT DETECTED
SURESWAB(R) ADV BACTERIAL VAGINOSIS(BV),TMA: NEGATIVE
TRICHOMONAS VAGINALIS (TV),TMA: NOT DETECTED

## 2022-10-29 LAB — TSH: TSH: 2.18 m[IU]/L

## 2022-11-02 ENCOUNTER — Other Ambulatory Visit: Payer: Self-pay | Admitting: Nurse Practitioner

## 2022-11-02 ENCOUNTER — Encounter: Payer: Self-pay | Admitting: Nurse Practitioner

## 2022-11-02 DIAGNOSIS — B3731 Acute candidiasis of vulva and vagina: Secondary | ICD-10-CM

## 2022-11-02 MED ORDER — FLUCONAZOLE 150 MG PO TABS: 150.0000 mg | ORAL_TABLET | ORAL | 0 refills | Status: DC

## 2022-11-02 NOTE — Telephone Encounter (Signed)
Patient seen in office on 10/28/22, diflucan recommended for yeast. Last RX sent 04/21/22  Routing to TW to advise on Rx.

## 2022-11-03 ENCOUNTER — Ambulatory Visit: Payer: 59 | Admitting: Nurse Practitioner

## 2022-11-12 ENCOUNTER — Other Ambulatory Visit: Payer: Self-pay | Admitting: Medical

## 2022-11-22 ENCOUNTER — Other Ambulatory Visit: Payer: Self-pay | Admitting: Medical

## 2022-11-22 NOTE — Telephone Encounter (Signed)
No longer a pt here. Now a pt at [facility]

## 2022-11-29 ENCOUNTER — Other Ambulatory Visit (HOSPITAL_COMMUNITY): Payer: Self-pay

## 2023-02-02 ENCOUNTER — Other Ambulatory Visit: Payer: Self-pay | Admitting: Nurse Practitioner

## 2023-02-02 DIAGNOSIS — M545 Low back pain, unspecified: Secondary | ICD-10-CM

## 2023-02-02 DIAGNOSIS — M25561 Pain in right knee: Secondary | ICD-10-CM

## 2023-02-17 ENCOUNTER — Other Ambulatory Visit: Payer: Self-pay | Admitting: Nurse Practitioner

## 2023-04-25 ENCOUNTER — Telehealth: Payer: Self-pay

## 2023-04-25 ENCOUNTER — Ambulatory Visit: Admitting: Internal Medicine

## 2023-04-25 ENCOUNTER — Encounter: Payer: Self-pay | Admitting: Internal Medicine

## 2023-04-25 ENCOUNTER — Other Ambulatory Visit: Payer: Self-pay

## 2023-04-25 ENCOUNTER — Telehealth: Payer: Self-pay | Admitting: Internal Medicine

## 2023-04-25 VITALS — BP 108/68 | HR 83 | Ht 65.0 in | Wt 178.0 lb

## 2023-04-25 DIAGNOSIS — J45991 Cough variant asthma: Secondary | ICD-10-CM | POA: Diagnosis not present

## 2023-04-25 MED ORDER — AIRSUPRA 90-80 MCG/ACT IN AERO
2.0000 | INHALATION_SPRAY | RESPIRATORY_TRACT | 11 refills | Status: AC | PRN
Start: 2023-04-25 — End: ?

## 2023-04-25 NOTE — Telephone Encounter (Signed)
 PT having issues getting Symbicort thru [employer], [address]. Can we do a PA please.  Also, she needs an Albuterol refill.She will be seen today.

## 2023-04-25 NOTE — Assessment & Plan Note (Addendum)
 Onset ? 2014 assoc with rhinitis  - started on dulera 100 2bid 06/27/13> better 07/11/2013  - 07/24/13 Sinus CT > Slight mucosal thickening in the right maxillary sinus. Remainder of the paranasal sinuses are clear. No air-fluid levels. No acute bony abnormality. Orbital soft tissues are unremarkable. - spirometry  08/22/2013 min airflow obst reflected in fef 25-75 despite prominent "wheeze" on exam - 09/11/13 increased dulera to 200 2bid due to concern not using the dulera frequently enough -  12/02/2016  extensive coaching HFA effectiveness =75% from  A baseline of 50%     -  Spirometry 12/02/2016  FEV1 2.50 (93%)  Ratio 74 off all rx, minimal curvature   - 11/22/2017   continue symb 80 - Allergy profile 11/22/17 >  Eos 0.4 /  IgE  29  RAST neg  - FENO 04/11/2018  =   69 p symb 80 2bid prior to study during flare of asthma  - 04/11/2018    increased symb to 160 2bid and added singulair > much better 04/27/2018  - 04/27/2018  After extensive coaching inhaler device,  effectiveness =    90%> continue symb 160 2bid/ singulair   >>>  04/25/2023 changed to Air supra for mild intermittent asthma/ cough variant (insurance not covering prn symbicort) and maintain on singulair at least thru the allergy season   Discussed in detail all the  indications, usual  risks and alternatives  relative to the benefits with patient who agrees to proceed with Rx as outlined.             Each maintenance medication was reviewed in detail including emphasizing most importantly the difference between maintenance and prns and under what circumstances the prns are to be triggered using an action plan format where appropriate.  Total time for H and P, chart review, counseling, reviewing hfa  device(s) and generating customized AVS unique to this office visit / same day charting = 30 min

## 2023-04-25 NOTE — Telephone Encounter (Signed)
 Per chart patient is on Wixela not Symbicort.  Will discuss at OV today.  No PA needed at this time.

## 2023-04-25 NOTE — Progress Notes (Signed)
 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 no problems as child or young adult with tendency to bad head colds since early 2014 with Sinus xray neg 08/28/12 then abrupt onset  cough / wheeze /doe then Fall 2014 p developed sneezing/ head congestion  dx'd as sinusitis at [facility] [address]  rx abx then worse again in November 2014 progressively worse  Despite rx and referred to Pulmonary clinic 06/28/13 by Cammy Fulp p admit     Admit date: 06/23/2013  Discharge date: 06/25/2013  Discharge Diagnoses:  CAP (community acquired pneumonia)  Anemia  Bronchospasm      06/27/2013 1st [facility] office visit/ Aemon Koeller  Chief Complaint  Patient presents with   Pulmonary Consult    Self referral- pt c/o cough and SOB since Nov 2014. Worse for the past month with recent hospitalization for PNA. Cough is prod with minimal clear sputum.   cough much worse when lie down with freq awakening > clear mucus was thick and green  , now clear saba helps wheeze, not really helping the cough, not used since May 2 - not really sob unless coughing rec Pantoprazole (protonix) 40 mg   Take 30-60 min before first meal of the day and Pepcid 20 mg one bedtime    GERD diet   Dulera 100 Take 2 puffs first thing in am and then another 2 puffs about 12 hours later.  Prednisone 10 mg take  4 each am x 2 days,   2 each am x 2 days,  1 each am x 2 days and stop Use the cough medication   If not improving call [facility] [phone] for sinus CT scan before next visit.       04/20/2021  yearly f/u ov/Adar Rase re: cough variant asthma   maint on symbicort 160 2bid  / pepcid 20mg  p bfast  Chief Complaint  Patient presents with   Follow-up   Dyspnea:  back to baseline  Cough: none  Sleeping: bed blocks  SABA use: none  02: none  Covid status:  vax x 2  Rec For flare of coughing for any reason > Try prilosec otc 20mg   Take 30-60  min before first meal of the day and Pepcid ac (famotidine) 20 mg one @  bedtime until better for at least a week  then back to pepcid 20 mg after bfast     04/25/2023  f/u ov/Zarra Geffert re: cough variant asthma  maint on singulair/symbicort  160  prn three times per week  Chief Complaint  Patient presents with   Asthma  Dyspnea:  power walking / outdoor biking  Cough: more x one month  Sleeping: bed blocks /  most nocts s  resp cc  SABA use: twice a month symb/ rarely alb 02: none      No obvious day to day or daytime variability or assoc excess/ purulent sputum or mucus plugs or hemoptysis or cp or chest tightness, subjective wheeze or overt sinus or hb symptoms.    Also denies any obvious fluctuation of symptoms with weather or environmental changes or other aggravating or alleviating factors except as outlined above   No unusual exposure hx or h/o childhood pna/ asthma or knowledge of premature birth.  Current Allergies, Complete Past Medical History, Past Surgical History, Family History, and Social History were reviewed in [employer]  record.  ROS  The following are not active complaints unless bolded Hoarseness, sore throat, dysphagia, dental problems, itching, sneezing,  nasal congestion or discharge of excess mucus or purulent secretions, ear ache,   fever, chills, sweats, unintended wt loss or wt gain, classically pleuritic or exertional cp,  orthopnea pnd or arm/hand swelling  or leg swelling, presyncope, palpitations, abdominal pain, anorexia, nausea, vomiting, diarrhea  or change in bowel habits or change in bladder habits, change in stools or change in urine, dysuria, hematuria,  rash, arthralgias, visual complaints, headache, numbness, weakness or ataxia or problems with walking or coordination,  change in mood or  memory.        Current Meds  Medication Sig   albuterol (VENTOLIN HFA) 108 (90 Base) MCG/ACT inhaler INHALE 1-2 PUFFS INTO THE LUNGS EVERY 4  HOURS AS NEEDED FOR WHEEZING OR SHORTNESS OF BREATH.   buPROPion (WELLBUTRIN XL) 300 MG 24 hr tablet Take 300 mg by mouth daily.   Doxepin HCl 6 MG TABS Take 0.5 tablets by mouth at bedtime.   famotidine (PEPCID) 20 MG tablet One twice daily after meals   ibuprofen (ADVIL) 600 MG tablet TAKE 1 TABLET BY MOUTH EVERY 6 HOURS AS NEEDED   levonorgestrel (MIRENA, 52 MG,) 20 MCG/24HR IUD Mirena 20 mcg/24 hours (7 yrs) 52 mg intrauterine device  Take 1 device by intrauterine route.   metFORMIN (GLUCOPHAGE) 500 MG tablet Take 500 mg by mouth 2 (two) times daily.   montelukast (SINGULAIR) 10 MG tablet TAKE 1 TABLET BY MOUTH EVERYDAY AT BEDTIME   Semaglutide-Weight Management 0.25 MG/0.5ML SOAJ Inject 0.25 mg into the skin once a week.   sertraline (ZOLOFT) 25 MG tablet Take 25 mg by mouth daily.   tiZANidine (ZANAFLEX) 4 MG tablet Take 1 tablet (4 mg total) by mouth 2 (two) times daily as needed for muscle spasms.   tretinoin (RETIN-A) 0.025 % cream Apply a pea size amount to entire face nightly              Objective:   Physical Exam  Wt  04/25/2023      178  04/20/2021   199  04/11/2020   190  10/09/2019    190  04/10/2019    187  07/11/2013    172> 08/22/2013 176 > 09/11/2013  177 > 10/09/2013  173 > 01/07/2014 173 > 06/06/2014  174 > 12/03/2014   180 >  12/02/2016     180 > 05/30/2017  183  > 11/22/2017 175 > 02/28/2018   182  > 04/11/2018   181 > 04/27/2018  181     06/27/13 171 lb 12.8 oz (77.928 kg)  06/23/13 170 lb 13.7 oz (77.5 kg)  11/13/11 197 lb (89.359 kg)     Vital signs reviewed  04/25/2023  - Note at rest 02 sats  99% on RA   General appearance:    amb pleasant b nad     HEENT : Oropharynx  nl      Nasal turbinates nl    NECK :  without  apparent JVD/ palpable Nodes/TM    LUNGS: no acc muscle use,  Nl contour chest which is clear to A and P bilaterally without cough on insp or exp maneuvers   CV:  RRR  no s3 or murmur or increase in P2, and no edema   ABD:  soft and nontender    MS:  Gait nl   ext warm without deformities Or obvious joint restrictions  calf tenderness,  cyanosis or clubbing    SKIN: warm and dry without lesions    NEURO:  alert, approp, nl sensorium with  no motor or cerebellar deficits apparent.                Assessment & Plan:

## 2023-04-25 NOTE — Patient Instructions (Signed)
 For cough wheeze, short of breath > air supra up to 2 puff every 4 hours as needed    Please schedule a follow up visit in 12 months but call sooner if needed

## 2023-04-25 NOTE — Telephone Encounter (Signed)
 Patient came in to office for OV. Provided FMLA paperwork.

## 2023-04-28 ENCOUNTER — Other Ambulatory Visit: Payer: Self-pay | Admitting: Nurse Practitioner

## 2023-04-28 DIAGNOSIS — G4486 Cervicogenic headache: Secondary | ICD-10-CM

## 2023-05-02 ENCOUNTER — Ambulatory Visit: Payer: 59 | Admitting: Dermatology

## 2023-05-05 ENCOUNTER — Telehealth: Payer: Self-pay | Admitting: *Deleted

## 2023-05-05 NOTE — Telephone Encounter (Signed)
 Called patient to let her know that her FMLA paperwork is complete and ready to pick up. There is no fax number on the form so it can not be faxed unless she provides one. Will make a copy and place at the front.

## 2023-07-14 ENCOUNTER — Ambulatory Visit: Payer: 59 | Admitting: Dermatology

## 2023-07-20 ENCOUNTER — Other Ambulatory Visit: Payer: Self-pay | Admitting: Nurse Practitioner

## 2023-07-28 ENCOUNTER — Other Ambulatory Visit (HOSPITAL_COMMUNITY): Payer: Self-pay

## 2023-07-28 ENCOUNTER — Other Ambulatory Visit: Payer: Self-pay | Admitting: Nurse Practitioner

## 2023-07-28 ENCOUNTER — Encounter: Payer: Self-pay | Admitting: Nurse Practitioner

## 2023-07-28 DIAGNOSIS — E66811 Other obesity due to excess calories: Secondary | ICD-10-CM

## 2023-07-28 MED ORDER — TRETINOIN 0.025 % EX CREA
1.0000 | TOPICAL_CREAM | Freq: Every evening | CUTANEOUS | 3 refills | Status: DC
Start: 2023-07-28 — End: 2023-08-03
  Filled 2023-07-28: qty 45, 90d supply, fill #0

## 2023-07-28 NOTE — Telephone Encounter (Signed)
 Can you check the schedule to see an available appt for weight loss.

## 2023-07-29 ENCOUNTER — Other Ambulatory Visit (HOSPITAL_COMMUNITY): Payer: Self-pay

## 2023-07-29 ENCOUNTER — Encounter (HOSPITAL_COMMUNITY): Payer: Self-pay

## 2023-08-03 ENCOUNTER — Ambulatory Visit: Admitting: Dermatology

## 2023-08-03 ENCOUNTER — Encounter: Payer: Self-pay | Admitting: Dermatology

## 2023-08-03 ENCOUNTER — Other Ambulatory Visit (HOSPITAL_COMMUNITY): Payer: Self-pay

## 2023-08-03 VITALS — BP 117/76 | HR 81

## 2023-08-03 DIAGNOSIS — L209 Atopic dermatitis, unspecified: Secondary | ICD-10-CM | POA: Diagnosis not present

## 2023-08-03 DIAGNOSIS — Z7189 Other specified counseling: Secondary | ICD-10-CM | POA: Diagnosis not present

## 2023-08-03 DIAGNOSIS — L700 Acne vulgaris: Secondary | ICD-10-CM | POA: Diagnosis not present

## 2023-08-03 MED ORDER — MOMETASONE FUROATE 0.1 % EX OINT
TOPICAL_OINTMENT | Freq: Every day | CUTANEOUS | 11 refills | Status: AC
Start: 2023-08-03 — End: ?

## 2023-08-03 MED ORDER — CLINDAMYCIN PHOSPHATE 1 % EX SWAB: 1.0000 | Freq: Every day | CUTANEOUS | 6 refills | Status: AC

## 2023-08-03 MED ORDER — ALTRENO 0.05 % EX LOTN
1.0000 | TOPICAL_LOTION | Freq: Every evening | CUTANEOUS | 5 refills | Status: AC
Start: 2023-08-03 — End: ?

## 2023-08-03 NOTE — Progress Notes (Signed)
   New Patient Visit   Subjective  Marie Harper is a 48 y.o. female who presents for the following: Eczema and Acne  Patient states she has rash located at the face, neck, and arms that she would like to have examined. Patient reports the areas have been there for 2 years. She reports the areas are bothersome. She stated the areas can be very itchy. Patient rates irritation 5 out of 10. Patient reports she has previously been treated for these areas (she was treated with [facility]). Patient reports heat is a big trigger for her. She does bathe in some fragrance Dove Body Washes.  Patient also has acne she would like to discuss. Patietn reprts   The following portions of the chart were reviewed this encounter and updated as appropriate: medications, allergies, medical history  Review of Systems:  No other skin or systemic complaints except as noted in HPI or Assessment and Plan.  Objective  Well appearing patient in no apparent distress; mood and affect are within normal limits.  A full examination was performed including scalp, head, eyes, ears, nose, lips, neck, chest, axillae, abdomen, back, buttocks, bilateral upper extremities, bilateral lower extremities, hands, feet, fingers, toes, fingernails, and toenails. All findings within normal limits unless otherwise noted below.   Relevant exam findings are noted in the Assessment and Plan.    Assessment & Plan   1. Acne - Assessment:  Patient has a history of acne with current complaints of pustules and oily skin. Previously used tretinoin  0.25% at least 3 nights a week, last used over a month ago. Skin currently looks good, but has dark marks from squeezing. Reports waking up with pustules and needs a regimen to control oil and shine. Previous treatments from other dermatology providers were not satisfactory.  - Plan:    Use La Roche-Posay gel cleanser morning and night    Apply clindamycin swabs in the morning    Use a moisturizer  with sunscreen daily    Start Altreno  0.05% (tretinoin  lotion), 3 nights a week    Prescribe clindamycin (form and strength not specified)    Try Cyclophate by Avene for dryness and peeling if needed    Follow-up in 4 months  2. Eczema - Assessment:  Patient has a history of eczema. No current symptoms or severity discussed.  - Plan:    Use mometasone  twice a day for up to 2 weeks  3. Skincare Regimen - Assessment:  Currently using various skincare products, including [employer] Beauty products with retinol and vitamin C. Requires gentle cleansers without active ingredients. Previous attempts with natural products from a health food store were ineffective.  - Plan:    Discontinue current active skincare products    Use La Roche-Posay gel cleanser as directed above    Continue using Clinique and Global moisturizers as tolerated    Try EucerinAdvanced Repair (purpose not specified)  ACNE VULGARIS   ATOPIC DERMATITIS, UNSPECIFIED TYPE    Return in about 4 months (around 12/03/2023) for Eczema and Acne F/U.  I, Jetta Ager, am acting as Neurosurgeon for [employer], DO.  Documentation: I have reviewed the above documentation for accuracy and completeness, and I agree with the above.  Louana Roup, DO

## 2023-08-03 NOTE — Patient Instructions (Addendum)
 Date: Wed Aug 03 2023  Marie Harper,  Thank you for visiting today. Here is a summary of the key instructions:  - Skin Care Routine:   - Use La Roche-Posay gel cleanser morning and night   - Apply clindamycin swabs in the morning   - Use a moisturizer with sunscreen daily   - Apply Altreno  0.05 cream 3 nights a week  - Medications:   - Use mometasone  cream twice a day for up to 2 weeks for eczema   - Try Cicalfate by Avene for dryness and peeling   - Try Eucerin Advanced Repair as needed  - Pharmacy Information:   - Altreno  will be sent to [facility] pharmacy   - Clindamycin will be sent to your regular pharmacy  - Follow-up: Return for a follow-up appointment in 4 months  Please reach out if you have any questions or concerns.  Warm regards,  Dr. Louana Roup Dermatology     Recommended Sunscreen      Important Information   Due to recent changes in healthcare laws, you may see results of your pathology and/or laboratory studies on MyChart before the doctors have had a chance to review them. We understand that in some cases there may be results that are confusing or concerning to you. Please understand that not all results are received at the same time and often the doctors may need to interpret multiple results in order to provide you with the best plan of care or course of treatment. Therefore, we ask that you please give us  2 business days to thoroughly review all your results before contacting the office for clarification. Should we see a critical lab result, you will be contacted sooner.     If You Need Anything After Your Visit   If you have any questions or concerns for your doctor, please call our main line at [phone]. If no one answers, please leave a voicemail as directed and we will return your call as soon as possible. Messages left after 4 pm will be answered the following business day.    You may also send us  a message via MyChart. We typically  respond to MyChart messages within 1-2 business days.  For prescription refills, please ask your pharmacy to contact our office. Our fax number is [phone].  If you have an urgent issue when the clinic is closed that cannot wait until the next business day, you can page your doctor at the number below.     Please note that while we do our best to be available for urgent issues outside of office hours, we are not available 24/7.    If you have an urgent issue and are unable to reach us , you may choose to seek medical care at your doctor's office, retail clinic, urgent care center, or emergency room.   If you have a medical emergency, please immediately call 911 or go to the emergency department. In the event of inclement weather, please call our main line at [phone] for an update on the status of any delays or closures.  Dermatology Medication Tips: Please keep the boxes that topical medications come in in order to help keep track of the instructions about where and how to use these. Pharmacies typically print the medication instructions only on the boxes and not directly on the medication tubes.   If your medication is too expensive, please contact our office at [phone] or send us  a message through MyChart.    We  are unable to tell what your co-pay for medications will be in advance as this is different depending on your insurance coverage. However, we may be able to find a substitute medication at lower cost or fill out paperwork to get insurance to cover a needed medication.    If a prior authorization is required to get your medication covered by your insurance company, please allow us  1-2 business days to complete this process.   Drug prices often vary depending on where the prescription is filled and some pharmacies may offer cheaper prices.   The website www.goodrx.com contains coupons for medications through different pharmacies. The prices here do not account for what  the cost may be with help from insurance (it may be cheaper with your insurance), but the website can give you the price if you did not use any insurance.  - You can print the associated coupon and take it with your prescription to the pharmacy.  - You may also stop by our office during regular business hours and pick up a GoodRx coupon card.  - If you need your prescription sent electronically to a different pharmacy, notify our office through [facility] or by phone at [phone]

## 2023-08-11 ENCOUNTER — Ambulatory Visit: Admitting: Nurse Practitioner

## 2023-08-11 ENCOUNTER — Encounter: Payer: Self-pay | Admitting: Nurse Practitioner

## 2023-08-11 ENCOUNTER — Ambulatory Visit: Payer: Self-pay | Admitting: Nurse Practitioner

## 2023-08-11 VITALS — BP 120/70 | HR 85 | Temp 98.6°F | Ht 65.0 in | Wt 181.6 lb

## 2023-08-11 DIAGNOSIS — Z6830 Body mass index (BMI) 30.0-30.9, adult: Secondary | ICD-10-CM

## 2023-08-11 DIAGNOSIS — M79602 Pain in left arm: Secondary | ICD-10-CM

## 2023-08-11 DIAGNOSIS — G4709 Other insomnia: Secondary | ICD-10-CM | POA: Diagnosis not present

## 2023-08-11 DIAGNOSIS — R7303 Prediabetes: Secondary | ICD-10-CM | POA: Diagnosis not present

## 2023-08-11 DIAGNOSIS — Z2821 Immunization not carried out because of patient refusal: Secondary | ICD-10-CM

## 2023-08-11 DIAGNOSIS — E66811 Obesity, class 1: Secondary | ICD-10-CM

## 2023-08-11 DIAGNOSIS — M79601 Pain in right arm: Secondary | ICD-10-CM

## 2023-08-11 DIAGNOSIS — E6609 Other obesity due to excess calories: Secondary | ICD-10-CM

## 2023-08-11 MED ORDER — SAXENDA 18 MG/3ML ~~LOC~~ SOPN: 3.0000 mg | PEN_INJECTOR | Freq: Every day | SUBCUTANEOUS | 1 refills | Status: DC

## 2023-08-11 NOTE — Patient Instructions (Addendum)
 Go to [address] [facility] [place] [facility] [employer].  Increase your physical activity to at least 150 minutes a week.  Had a long conversation with her about expanding calories compared to her intake of calories. Encouraged to limit her intake of sugary foods and to replace them with fruits sugary foods and replace them with fruits. Saxenda  side effects to include nausea, difficulty swallowing and abdominal pain.

## 2023-08-11 NOTE — Assessment & Plan Note (Signed)
 Discussed the importance of adequate sleep. She is encouraged to take her Doxepin more regularly and to have good sleep hygiene.

## 2023-08-11 NOTE — Assessment & Plan Note (Signed)
 I will send her prescription for Saxenda  pending insurance approval to at lower pharmacy per the patient after speaking to her insurance company.  Will try this for couple of months and may switch to Zepbound.  She is to titrate weekly as tolerated. Goal to lose 10% body weight in 4 months if approved by insurance

## 2023-08-11 NOTE — Progress Notes (Signed)
 LILLETTE Kristeen JINNY Gladis, CMA,acting as a Neurosurgeon for Marie Ada, FNP.,have documented all relevant documentation on the behalf of Marie Ada, FNP,as directed by  Marie Ada, FNP while in the presence of Marie Ada, FNP.  Subjective:  Patient ID: Marie Harper , female    DOB: February 16, 1976 , 48 y.o.   MRN: [id number]  Chief Complaint  Patient presents with   Obesity    Patient presents today for wanting to start weight loss medication, Patient reports compliance with medication. Patient denies any chest pain, SOB, or headaches. Patient reports she works out about 2 days a week of cardio. Patient reports she has tried and failed diet changes. She reports her craving for sugar has increased   Arm Pain    Patient reports every night and morning her arms hurt and are numb, she reports in the morning it takes her a while to get up and go due to the feeling in her arms. Patient reports it started a while ago she reports she was seeing a neurologist previously but didn't really give her any information.     HPI  Arm pain is more constant. Has seen neurology (in [facility]) and had nerve conduction. She has worsening tingling and pain sensation bilateral arms.  She does see a provider at [facility].   She continues to go to [facility] up to one month ago was her last visit.  She has been taking semaglutide  injections by [facility].  She reports exercising 2 days a week has had some challenges with sugar cravings. She has tried other diets which have been ineffective.         Past Medical History:  Diagnosis Date   Allergy     seasonal   Anemia    Anxiety    Arthritis    Asthma    Chronic kidney disease    Depression 2020   GERD (gastroesophageal reflux disease)    Headache(784.0)    has not had migraine in long time   Hemorrhage of anus and rectum 04/28/2022   Rectal bleeding 04/28/2022   Ulcer      Family History  Problem Relation Age of Onset   Diabetes Mother    Cancer Maternal  Aunt        nasal/sinus   Asthma Paternal Aunt    Emphysema Paternal Uncle        smoked   Diabetes Maternal Grandmother    Heart disease Maternal Grandmother    Diabetes Paternal Grandmother    Arthritis Other    Kidney disease Other    Diabetes Other    Anesthesia problems Neg Hx      Current Outpatient Medications:    albuterol  (VENTOLIN  HFA) 108 (90 Base) MCG/ACT inhaler, INHALE 1-2 PUFFS INTO THE LUNGS EVERY 4 HOURS AS NEEDED FOR WHEEZING OR SHORTNESS OF BREATH., Disp: 8.5 each, Rfl: 1   Albuterol -Budesonide  (AIRSUPRA ) 90-80 MCG/ACT AERO, Inhale 2 puffs into the lungs every 4 (four) hours as needed., Disp: 10.7 g, Rfl: 11   ALTRENO  0.05 % LOTN, Apply 1 Application topically at bedtime. For the first month apply 2 nights weekly, after 1 month apply 3 nights weekly, Disp: 20 g, Rfl: 5   buPROPion  (WELLBUTRIN  XL) 300 MG 24 hr tablet, Take 300 mg by mouth daily., Disp: , Rfl:    clindamycin  (CLEOCIN  T) 1 % SWAB, Apply 1 Application topically daily. Swab face in the morning after washing, Disp: 69 each, Rfl: 6   Doxepin HCl 6 MG TABS,  TAKE 1/2 TAB ORAL AT BEDTIME FOR INSOMNIA, Disp: 45 tablet, Rfl: 1   fexofenadine (ALLEGRA) 180 MG tablet, Take 1 tablet by mouth daily., Disp: , Rfl:    levonorgestrel (MIRENA, 52 MG,) 20 MCG/24HR IUD, Mirena 20 mcg/24 hours (7 yrs) 52 mg intrauterine device  Take 1 device by intrauterine route., Disp: , Rfl:    linaclotide (LINZESS) 145 MCG CAPS capsule, 1 cap(s) orally once a day for 90, Disp: , Rfl:    Liraglutide  -Weight Management (SAXENDA ) 18 MG/3ML SOPN, Inject 3 mg into the skin daily., Disp: 9 mL, Rfl: 1   meloxicam (MOBIC) 15 MG tablet, TAKE 1 TABLET BY MOUTH EVERY DAY, Disp: 30 tablet, Rfl: 1   mometasone  (ELOCON ) 0.1 % ointment, Apply topically daily. Apply 2 times daily for 2 weeks then STOP and take a break for 2 WEEKS, Disp: 45 g, Rfl: 11   montelukast  (SINGULAIR ) 10 MG tablet, TAKE 1 TABLET BY MOUTH EVERYDAY AT BEDTIME, Disp: 90 tablet, Rfl:  1   sertraline  (ZOLOFT ) 25 MG tablet, Take 25 mg by mouth daily., Disp: , Rfl:    SUMAtriptan  (IMITREX ) 50 MG tablet, TAKE 1 TABLET BY MOUTH ONCE AS NEEDED FOR MIGRAINE. MAY REPEAT IN 2 HOURS IF HEADACHE PERSISTS OR RECURS., Disp: 27 tablet, Rfl: 3   tiZANidine  (ZANAFLEX ) 4 MG tablet, Take 1 tablet (4 mg total) by mouth 2 (two) times daily as needed for muscle spasms., Disp: 30 tablet, Rfl: 0   No Known Allergies   Review of Systems  Constitutional: Negative.   Respiratory: Negative.    Cardiovascular: Negative.   Neurological: Negative.   Psychiatric/Behavioral: Negative.       Today's Vitals   08/11/23 1008  BP: 120/70  Pulse: 85  Temp: 98.6 F (37 C)  TempSrc: Oral  Weight: 181 lb 9.6 oz (82.4 kg)  Height: 5' 5 (1.651 m)  PainSc: 0-No pain   Body mass index is 30.22 kg/m.  Wt Readings from Last 3 Encounters:  08/11/23 181 lb 9.6 oz (82.4 kg)  04/25/23 178 lb (80.7 kg)  10/28/22 173 lb (78.5 kg)     Objective:  Physical Exam Vitals and nursing note reviewed.  Constitutional:      General: She is not in acute distress.    Appearance: Normal appearance. She is obese.   Cardiovascular:     Rate and Rhythm: Normal rate and regular rhythm.     Pulses: Normal pulses.     Heart sounds: Normal heart sounds. No murmur heard. Pulmonary:     Effort: Pulmonary effort is normal. No respiratory distress.     Breath sounds: Normal breath sounds. No wheezing.   Neurological:     General: No focal deficit present.     Mental Status: She is alert and oriented to person, place, and time.     Cranial Nerves: No cranial nerve deficit.     Motor: No weakness.      Assessment And Plan:  COVID-19 vaccination declined Assessment & Plan: Declines covid 19 vaccine. Discussed risk of covid 59 and if she changes her mind about the vaccine to call the office. Education has been provided regarding the importance of this vaccine but patient still declined. Advised may receive this  vaccine at local pharmacy or Health Dept.or vaccine clinic. Aware to provide a copy of the vaccination record if obtained from local pharmacy or Health Dept.  Encouraged to take multivitamin, vitamin d, vitamin c and zinc to increase immune system. Aware can call office  if would like to have vaccine here at office. Verbalized acceptance and understanding.     Other insomnia Assessment & Plan: Discussed the importance of adequate sleep. She is encouraged to take her Doxepin more regularly and to have good sleep hygiene.    Pain in both upper extremities -     DG Cervical Spine Complete; Future  Prediabetes Assessment & Plan: Hemoglobin A1c is stable.  Diet controlled.   Class 1 obesity due to excess calories with body mass index (BMI) of 30.0 to 30.9 in adult, unspecified whether serious comorbidity present Assessment & Plan: I will send her prescription for Saxenda  pending insurance approval to at lower pharmacy per the patient after speaking to her insurance company.  Will try this for couple of months and may switch to Zepbound.  She is to titrate weekly as tolerated. Goal to lose 10% body weight in 4 months if approved by insurance   Orders: -     Saxenda ; Inject 3 mg into the skin daily.  Dispense: 9 mL; Refill: 1    Return for 8-10 weeks weight check.  Patient was given opportunity to ask questions. Patient verbalized understanding of the plan and was able to repeat key elements of the plan. All questions were answered to their satisfaction.    LILLETTE Marie Ada, FNP, have reviewed all documentation for this visit. The documentation on 08/11/23 for the exam, diagnosis, procedures, and orders are all accurate and complete.   IF YOU HAVE BEEN REFERRED TO A SPECIALIST, IT MAY TAKE 1-2 WEEKS TO SCHEDULE/PROCESS THE REFERRAL. IF YOU HAVE NOT HEARD FROM US /SPECIALIST IN TWO WEEKS, PLEASE GIVE US  A CALL AT [phone].

## 2023-08-11 NOTE — Assessment & Plan Note (Signed)
 Hemoglobin A1c is stable.  Diet controlled.

## 2023-08-11 NOTE — Assessment & Plan Note (Signed)

## 2023-08-18 ENCOUNTER — Encounter: Payer: Self-pay | Admitting: Nurse Practitioner

## 2023-08-19 ENCOUNTER — Encounter: Payer: Self-pay | Admitting: Nurse Practitioner

## 2023-08-22 ENCOUNTER — Ambulatory Visit
Admission: RE | Admit: 2023-08-22 | Discharge: 2023-08-22 | Disposition: A | Discharge status: Home / Self Care | Source: Ambulatory Visit | Attending: Nurse Practitioner | Admitting: Nurse Practitioner

## 2023-08-22 DIAGNOSIS — M79601 Pain in right arm: Secondary | ICD-10-CM

## 2023-08-23 ENCOUNTER — Other Ambulatory Visit: Payer: Self-pay | Admitting: Nurse Practitioner

## 2023-08-24 ENCOUNTER — Encounter: Payer: Self-pay | Admitting: Dermatology

## 2023-08-25 ENCOUNTER — Ambulatory Visit: Payer: Self-pay | Admitting: Nurse Practitioner

## 2023-08-25 ENCOUNTER — Ambulatory Visit: Admitting: Nurse Practitioner

## 2023-08-29 ENCOUNTER — Other Ambulatory Visit: Payer: Self-pay | Admitting: Nurse Practitioner

## 2023-08-29 DIAGNOSIS — E6609 Other obesity due to excess calories: Secondary | ICD-10-CM

## 2023-08-29 MED ORDER — WEGOVY 0.5 MG/0.5ML ~~LOC~~ SOAJ: 0.5000 mg | SUBCUTANEOUS | 1 refills | Status: DC

## 2023-08-29 MED ORDER — TRAMADOL HCL 50 MG PO TABS: 50.0000 mg | ORAL_TABLET | Freq: Four times a day (QID) | ORAL | 0 refills | Status: AC | PRN

## 2023-08-30 ENCOUNTER — Telehealth: Payer: Self-pay

## 2023-08-30 NOTE — Telephone Encounter (Signed)
 Saxenda  6mg  denied- Insurance states your prescription benefit plan does not cover the requested medication.

## 2023-08-31 ENCOUNTER — Ambulatory Visit: Admitting: Family Medicine

## 2023-08-31 ENCOUNTER — Encounter: Payer: Self-pay | Admitting: Family Medicine

## 2023-08-31 VITALS — BP 122/82 | HR 76 | Temp 98.5°F | Ht 65.0 in | Wt 183.0 lb

## 2023-08-31 DIAGNOSIS — E66811 Obesity, class 1: Secondary | ICD-10-CM | POA: Diagnosis not present

## 2023-08-31 DIAGNOSIS — Z6830 Body mass index (BMI) 30.0-30.9, adult: Secondary | ICD-10-CM | POA: Diagnosis not present

## 2023-08-31 DIAGNOSIS — E6609 Other obesity due to excess calories: Secondary | ICD-10-CM
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
## 2023-09-07 ENCOUNTER — Telehealth: Admitting: Physician Assistant

## 2023-09-07 ENCOUNTER — Ambulatory Visit: Payer: Self-pay

## 2023-09-07 DIAGNOSIS — B379 Candidiasis, unspecified: Secondary | ICD-10-CM | POA: Diagnosis not present

## 2023-09-07 DIAGNOSIS — T3695XA Adverse effect of unspecified systemic antibiotic, initial encounter: Secondary | ICD-10-CM | POA: Diagnosis not present

## 2023-09-07 DIAGNOSIS — B9689 Other specified bacterial agents as the cause of diseases classified elsewhere: Secondary | ICD-10-CM | POA: Diagnosis not present

## 2023-09-07 DIAGNOSIS — J019 Acute sinusitis, unspecified: Secondary | ICD-10-CM

## 2023-09-07 MED ORDER — AMOXICILLIN-POT CLAVULANATE 875-125 MG PO TABS: 1.0000 | ORAL_TABLET | Freq: Two times a day (BID) | ORAL | 0 refills | Status: DC

## 2023-09-07 MED ORDER — FLUCONAZOLE 150 MG PO TABS
150.0000 mg | ORAL_TABLET | ORAL | 0 refills | Status: DC | PRN
Start: 2023-09-07 — End: 2023-09-26

## 2023-09-07 NOTE — Telephone Encounter (Signed)
 FYI Only or Action Required?: FYI only for provider.  Patient was last seen in primary care on 08/31/2023 by Waylan Darice BRAVO, DO.  Called Nurse Triage reporting Sinusitis.  Symptoms began several days ago.  Interventions attempted: OTC medications: tylenol  along with saline rinse.  Symptoms are: gradually worsening.  Triage Disposition: No disposition on file.  Patient/caregiver understands and will follow disposition?:   Copied from CRM [id number]. Topic: Clinical - Red Word Triage >> Sep 07, 2023 10:54 AM Powell HERO wrote: Red Word that prompted transfer to Nurse Triage: Patient states that she thinks she has a possible sinus infection. Voice sounding raspy. Noticed this morning a bruise in the area of the inner corner of her eye left side, headaches in her left temple/ back head tension since the weekend. Online found information about possible blood clots, Started July 3rd. Reason for Disposition  [1] Redness or swelling on the cheek, forehead or around the eye AND [2] no fever  Answer Assessment - Initial Assessment Questions 1. LOCATION: Where does it hurt?      Congestion, headache, pharyngitis 2. ONSET: When did the sinus pain start?  (e.g., hours, days)      3 days ago 3. SEVERITY: How bad is the pain?   (Scale 0-10; or none, mild, moderate or severe)     4/10 4. RECURRENT SYMPTOM: Have you ever had sinus problems before? If Yes, ask: When was the last time? and What happened that time?      July 3rd had post nasal drip that resolved 5. NASAL CONGESTION: Is the nose blocked? If Yes, ask: Can you open it or must you breathe through your mouth?     No 6. NASAL DISCHARGE: Do you have discharge from your nose? If so ask, What color?     No 7. FEVER: Do you have a fever? If Yes, ask: What is it, how was it measured, and when did it start?      No  Protocols used: Sinus Pain or Congestion-A-AH

## 2023-09-07 NOTE — Patient Instructions (Signed)
 Marie Harper, thank you for joining Delon CHRISTELLA Dickinson, PA-C for today's virtual visit.  While this provider is not your primary care provider (PCP), if your PCP is located in our provider database this encounter information will be shared with them immediately following your visit.   A [facility] MyChart account gives you access to today's visit and all your visits, tests, and labs performed at [facility]  click here if you don't have a [facility] MyChart account or go to [email or web address]  Consent: (Patient) Marie Harper provided verbal consent for this virtual visit at the beginning of the encounter.  Current Medications:  Current Outpatient Medications:    amoxicillin -clavulanate (AUGMENTIN ) 875-125 MG tablet, Take 1 tablet by mouth 2 (two) times daily., Disp: 14 tablet, Rfl: 0   fluconazole  (DIFLUCAN ) 150 MG tablet, Take 1 tablet (150 mg total) by mouth every 3 (three) days as needed., Disp: 2 tablet, Rfl: 0   albuterol  (VENTOLIN  HFA) 108 (90 Base) MCG/ACT inhaler, INHALE 1-2 PUFFS INTO THE LUNGS EVERY 4 HOURS AS NEEDED FOR WHEEZING OR SHORTNESS OF BREATH., Disp: 8.5 each, Rfl: 1   Albuterol -Budesonide  (AIRSUPRA ) 90-80 MCG/ACT AERO, Inhale 2 puffs into the lungs every 4 (four) hours as needed., Disp: 10.7 g, Rfl: 11   ALTRENO  0.05 % LOTN, Apply 1 Application topically at bedtime. For the first month apply 2 nights weekly, after 1 month apply 3 nights weekly, Disp: 20 g, Rfl: 5   buPROPion  (WELLBUTRIN  XL) 300 MG 24 hr tablet, Take 300 mg by mouth daily., Disp: , Rfl:    clindamycin  (CLEOCIN  T) 1 % SWAB, Apply 1 Application topically daily. Swab face in the morning after washing, Disp: 69 each, Rfl: 6   Doxepin HCl 6 MG TABS, TAKE 1/2 TAB ORAL AT BEDTIME FOR INSOMNIA, Disp: 45 tablet, Rfl: 1   fexofenadine (ALLEGRA) 180 MG tablet, Take 1 tablet by mouth daily., Disp: , Rfl:    levonorgestrel (MIRENA, 52 MG,) 20 MCG/24HR IUD, Mirena 20 mcg/24 hours (7  yrs) 52 mg intrauterine device  Take 1 device by intrauterine route., Disp: , Rfl:    linaclotide (LINZESS) 145 MCG CAPS capsule, 1 cap(s) orally once a day for 90, Disp: , Rfl:    meloxicam (MOBIC) 15 MG tablet, TAKE 1 TABLET BY MOUTH EVERY DAY, Disp: 30 tablet, Rfl: 1   mometasone  (ELOCON ) 0.1 % ointment, Apply topically daily. Apply 2 times daily for 2 weeks then STOP and take a break for 2 WEEKS, Disp: 45 g, Rfl: 11   montelukast  (SINGULAIR ) 10 MG tablet, TAKE 1 TABLET BY MOUTH EVERYDAY AT BEDTIME, Disp: 90 tablet, Rfl: 1   sertraline  (ZOLOFT ) 25 MG tablet, Take 25 mg by mouth daily., Disp: , Rfl:    SUMAtriptan  (IMITREX ) 50 MG tablet, TAKE 1 TABLET BY MOUTH ONCE AS NEEDED FOR MIGRAINE. MAY REPEAT IN 2 HOURS IF HEADACHE PERSISTS OR RECURS., Disp: 27 tablet, Rfl: 3   tiZANidine  (ZANAFLEX ) 4 MG tablet, Take 1 tablet (4 mg total) by mouth 2 (two) times daily as needed for muscle spasms., Disp: 30 tablet, Rfl: 0   traMADol  (ULTRAM ) 50 MG tablet, Take 1 tablet (50 mg total) by mouth every 6 (six) hours as needed., Disp: 20 tablet, Rfl: 0   Medications ordered in this encounter:  Meds ordered this encounter  Medications   amoxicillin -clavulanate (AUGMENTIN ) 875-125 MG tablet    Sig: Take 1 tablet by mouth 2 (two) times daily.    Dispense:  14 tablet  Refill:  0    Supervising Provider:   BLAISE ALEENE KIDD [[id number]]   fluconazole  (DIFLUCAN ) 150 MG tablet    Sig: Take 1 tablet (150 mg total) by mouth every 3 (three) days as needed.    Dispense:  2 tablet    Refill:  0    Supervising Provider:   LAMPTEY, PHILIP O [[id number]]     *If you need refills on other medications prior to your next appointment, please contact your pharmacy*  Follow-Up: Call back or seek an in-person evaluation if the symptoms worsen or if the condition fails to improve as anticipated.  [facility] [phone]  Other Instructions Sinus Infection, Adult A sinus infection, also called sinusitis, is  inflammation of your sinuses. Sinuses are hollow spaces in the bones around your face. Your sinuses are located: Around your eyes. In the middle of your forehead. Behind your nose. In your cheekbones. Mucus normally drains out of your sinuses. When your nasal tissues become inflamed or swollen, mucus can become trapped or blocked. This allows bacteria, viruses, and fungi to grow, which leads to infection. Most infections of the sinuses are caused by a virus. A sinus infection can develop quickly. It can last for up to 4 weeks (acute) or for more than 12 weeks (chronic). A sinus infection often develops after a cold. What are the causes? This condition is caused by anything that creates swelling in the sinuses or stops mucus from draining. This includes: Allergies. Asthma. Infection from bacteria or viruses. Deformities or blockages in your nose or sinuses. Abnormal growths in the nose (nasal polyps). Pollutants, such as chemicals or irritants in the air. Infection from fungi. This is rare. What increases the risk? You are more likely to develop this condition if you: Have a weak body defense system (immune system). Do a lot of swimming or diving. Overuse nasal sprays. Smoke. What are the signs or symptoms? The main symptoms of this condition are pain and a feeling of pressure around the affected sinuses. Other symptoms include: Stuffy nose or congestion that makes it difficult to breathe through your nose. Thick yellow or greenish drainage from your nose. Tenderness, swelling, and warmth over the affected sinuses. A cough that may get worse at night. Decreased sense of smell and taste. Extra mucus that collects in the throat or the back of the nose (postnasal drip) causing a sore throat or bad breath. Tiredness (fatigue). Fever. How is this diagnosed? This condition is diagnosed based on: Your symptoms. Your medical history. A physical exam. Tests to find out if your condition is  acute or chronic. This may include: Checking your nose for nasal polyps. Viewing your sinuses using a device that has a light (endoscope). Testing for allergies or bacteria. Imaging tests, such as an MRI or CT scan. In rare cases, a bone biopsy may be done to rule out more serious types of fungal sinus disease. How is this treated? Treatment for a sinus infection depends on the cause and whether your condition is chronic or acute. If caused by a virus, your symptoms should go away on their own within 10 days. You may be given medicines to relieve symptoms. They include: Medicines that shrink swollen nasal passages (decongestants). A spray that eases inflammation of the nostrils (topical intranasal corticosteroids). Rinses that help get rid of thick mucus in your nose (nasal saline washes). Medicines that treat allergies (antihistamines). Over-the-counter pain relievers. If caused by bacteria, your health care provider may recommend waiting  to see if your symptoms improve. Most bacterial infections will get better without antibiotic medicine. You may be given antibiotics if you have: A severe infection. A weak immune system. If caused by narrow nasal passages or nasal polyps, surgery may be needed. Follow these instructions at home: Medicines Take, use, or apply over-the-counter and prescription medicines only as told by your health care provider. These may include nasal sprays. If you were prescribed an antibiotic medicine, take it as told by your health care provider. Do not stop taking the antibiotic even if you start to feel better. Hydrate and humidify  Drink enough fluid to keep your urine pale yellow. Staying hydrated will help to thin your mucus. Use a cool mist humidifier to keep the humidity level in your home above 50%. Inhale steam for 10-15 minutes, 3-4 times a day, or as told by your health care provider. You can do this in the bathroom while a hot shower is running. Limit  your exposure to cool or dry air. Rest Rest as much as possible. Sleep with your head raised (elevated). Make sure you get enough sleep each night. General instructions  Apply a warm, moist washcloth to your face 3-4 times a day or as told by your health care provider. This will help with discomfort. Use nasal saline washes as often as told by your health care provider. Wash your hands often with soap and water to reduce your exposure to germs. If soap and water are not available, use hand sanitizer. Do not smoke. Avoid being around people who are smoking (secondhand smoke). Keep all follow-up visits. This is important. Contact a health care provider if: You have a fever. Your symptoms get worse. Your symptoms do not improve within 10 days. Get help right away if: You have a severe headache. You have persistent vomiting. You have severe pain or swelling around your face or eyes. You have vision problems. You develop confusion. Your neck is stiff. You have trouble breathing. These symptoms may be an emergency. Get help right away. Call 911. Do not wait to see if the symptoms will go away. Do not drive yourself to the hospital. Summary A sinus infection is soreness and inflammation of your sinuses. Sinuses are hollow spaces in the bones around your face. This condition is caused by nasal tissues that become inflamed or swollen. The swelling traps or blocks the flow of mucus. This allows bacteria, viruses, and fungi to grow, which leads to infection. If you were prescribed an antibiotic medicine, take it as told by your health care provider. Do not stop taking the antibiotic even if you start to feel better. Keep all follow-up visits. This is important. This information is not intended to replace advice given to you by your health care provider. Make sure you discuss any questions you have with your health care provider. Document Revised: 01/13/2021 Document Reviewed:  01/13/2021 Elsevier Patient Education  2024 [employer]   If you have been instructed to have an in-person evaluation today at a local Urgent Care facility, please use the link below. It will take you to a list of all of our available [facility], including address, phone number and hours of operation. Please do not delay care.  [facility]  If you or a family member do not have a primary care provider, use the link below to schedule a visit and establish care. When you choose a [facility] primary care physician or advanced practice provider, you  gain a long-term partner in health. Find a Primary Care Provider  Learn more about [facility]'s in-office and virtual care options: [facility] - Get Care Now

## 2023-09-07 NOTE — Progress Notes (Signed)
 Virtual Visit Consent   Marie Harper, you are scheduled for a virtual visit with a [facility] provider today. Just as with appointments in the office, your consent must be obtained to participate. Your consent will be active for this visit and any virtual visit you may have with one of our providers in the next 365 days. If you have a MyChart account, a copy of this consent can be sent to you electronically.  As this is a virtual visit, video technology does not allow for your provider to perform a traditional examination. This may limit your provider's ability to fully assess your condition. If your provider identifies any concerns that need to be evaluated in person or the need to arrange testing (such as labs, EKG, etc.), we will make arrangements to do so. Although advances in technology are sophisticated, we cannot ensure that it will always work on either your end or our end. If the connection with a video visit is poor, the visit may have to be switched to a telephone visit. With either a video or telephone visit, we are not always able to ensure that we have a secure connection.  By engaging in this virtual visit, you consent to the provision of healthcare and authorize for your insurance to be billed (if applicable) for the services provided during this visit. Depending on your insurance coverage, you may receive a charge related to this service.  I need to obtain your verbal consent now. Are you willing to proceed with your visit today? Marie Harper has provided verbal consent on 09/07/2023 for a virtual visit (video or telephone). Marie CHRISTELLA Dickinson, PA-C  Date: 09/07/2023 4:13 PM   Virtual Visit via Video Note   I, Marie Harper, connected with  Marie Harper  ([id number], [birth year]) on 09/07/23 at  4:00 PM EDT by a video-enabled telemedicine application and verified that I am speaking with the correct person using two identifiers.  Location: Patient: Virtual  Visit Location Patient: Home Provider: Virtual Visit Location Provider: Home Office   I discussed the limitations of evaluation and management by telemedicine and the availability of in person appointments. The patient expressed understanding and agreed to proceed.    History of Present Illness: Marie Harper is a 48 y.o. who identifies as a female who was assigned female at birth, and is being seen today for sinus congestion.  HPI: URI  This is a new problem. The current episode started 1 to 4 weeks ago (Started July 3rd). The problem has been gradually worsening. There has been no fever. Associated symptoms include congestion, headaches (left sided), rhinorrhea (and post nasal drainage) and sinus pain. Pertinent negatives include no coughing, diarrhea, ear pain, nausea, plugged ear sensation, sneezing, sore throat or vomiting. Associated symptoms comments: Hoarse voice, mild dizziness once or twice, bruising over the left lateral nasal bridge and medial orbital area. She has tried NSAIDs (ibuprofen ) for the symptoms. The treatment provided no relief.     Problems:  Patient Active Problem List   Diagnosis Date Noted   COVID-19 vaccination declined 08/11/2023   Other insomnia 08/11/2023   Pain in both upper extremities 08/11/2023   Encounter for annual health examination 09/29/2022   Mass of right axilla 09/29/2022   Overweight with body mass index (BMI) of 29 to 29.9 in adult 09/29/2022   Change in bowel habits 04/28/2022   Dysmenorrhea 04/28/2022   Hemorrhoids 04/28/2022   Prediabetes 04/28/2022   Class 1 obesity due to excess  calories with body mass index (BMI) of 30.0 to 30.9 in adult 04/28/2022   Generalized anxiety disorder 12/18/2020   Depressed mood 12/18/2020   Blurred vision 09/23/2020   Acne 07/24/2020   Antibiotic long-term use 07/24/2020   Sleep disturbance 06/03/2020   Allergic rhinitis due to pollen 06/03/2020   Neck muscle spasm 03/31/2020   Neck pain  03/31/2020   Muscle twitch 03/31/2020   IUD contraception 06/19/2019   Low back pain 09/12/2018   GERD (gastroesophageal reflux disease) 02/28/2018   Obesity with body mass index 30 or greater 08/30/2016   Cervical neck pain with evidence of disc disease 01/07/2014   Upper airway cough syndrome 08/22/2013   Cough variant asthma 06/28/2013   Anemia 02/18/2011   Snoring 02/18/2011    Allergies: No Known Allergies Medications:  Current Outpatient Medications:    amoxicillin -clavulanate (AUGMENTIN ) 875-125 MG tablet, Take 1 tablet by mouth 2 (two) times daily., Disp: 14 tablet, Rfl: 0   fluconazole  (DIFLUCAN ) 150 MG tablet, Take 1 tablet (150 mg total) by mouth every 3 (three) days as needed., Disp: 2 tablet, Rfl: 0   albuterol  (VENTOLIN  HFA) 108 (90 Base) MCG/ACT inhaler, INHALE 1-2 PUFFS INTO THE LUNGS EVERY 4 HOURS AS NEEDED FOR WHEEZING OR SHORTNESS OF BREATH., Disp: 8.5 each, Rfl: 1   Albuterol -Budesonide  (AIRSUPRA ) 90-80 MCG/ACT AERO, Inhale 2 puffs into the lungs every 4 (four) hours as needed., Disp: 10.7 g, Rfl: 11   ALTRENO  0.05 % LOTN, Apply 1 Application topically at bedtime. For the first month apply 2 nights weekly, after 1 month apply 3 nights weekly, Disp: 20 g, Rfl: 5   buPROPion  (WELLBUTRIN  XL) 300 MG 24 hr tablet, Take 300 mg by mouth daily., Disp: , Rfl:    clindamycin  (CLEOCIN  T) 1 % SWAB, Apply 1 Application topically daily. Swab face in the morning after washing, Disp: 69 each, Rfl: 6   Doxepin HCl 6 MG TABS, TAKE 1/2 TAB ORAL AT BEDTIME FOR INSOMNIA, Disp: 45 tablet, Rfl: 1   fexofenadine (ALLEGRA) 180 MG tablet, Take 1 tablet by mouth daily., Disp: , Rfl:    levonorgestrel (MIRENA, 52 MG,) 20 MCG/24HR IUD, Mirena 20 mcg/24 hours (7 yrs) 52 mg intrauterine device  Take 1 device by intrauterine route., Disp: , Rfl:    linaclotide (LINZESS) 145 MCG CAPS capsule, 1 cap(s) orally once a day for 90, Disp: , Rfl:    meloxicam (MOBIC) 15 MG tablet, TAKE 1 TABLET BY MOUTH EVERY  DAY, Disp: 30 tablet, Rfl: 1   mometasone  (ELOCON ) 0.1 % ointment, Apply topically daily. Apply 2 times daily for 2 weeks then STOP and take a break for 2 WEEKS, Disp: 45 g, Rfl: 11   montelukast  (SINGULAIR ) 10 MG tablet, TAKE 1 TABLET BY MOUTH EVERYDAY AT BEDTIME, Disp: 90 tablet, Rfl: 1   sertraline  (ZOLOFT ) 25 MG tablet, Take 25 mg by mouth daily., Disp: , Rfl:    SUMAtriptan  (IMITREX ) 50 MG tablet, TAKE 1 TABLET BY MOUTH ONCE AS NEEDED FOR MIGRAINE. MAY REPEAT IN 2 HOURS IF HEADACHE PERSISTS OR RECURS., Disp: 27 tablet, Rfl: 3   tiZANidine  (ZANAFLEX ) 4 MG tablet, Take 1 tablet (4 mg total) by mouth 2 (two) times daily as needed for muscle spasms., Disp: 30 tablet, Rfl: 0   traMADol  (ULTRAM ) 50 MG tablet, Take 1 tablet (50 mg total) by mouth every 6 (six) hours as needed., Disp: 20 tablet, Rfl: 0  Observations/Objective: Patient is well-developed, well-nourished in no acute distress.  Resting comfortably at home.  Head is normocephalic, atraumatic.  No labored breathing.  Speech is clear and coherent with logical content.  Patient is alert and oriented at baseline.    Assessment and Plan: 1. Acute bacterial sinusitis (Primary) - amoxicillin -clavulanate (AUGMENTIN ) 875-125 MG tablet; Take 1 tablet by mouth 2 (two) times daily.  Dispense: 14 tablet; Refill: 0  2. Antibiotic-induced yeast infection - fluconazole  (DIFLUCAN ) 150 MG tablet; Take 1 tablet (150 mg total) by mouth every 3 (three) days as needed.  Dispense: 2 tablet; Refill: 0  - Worsening symptoms that have not responded to OTC medications.  - Will give Augmentin  - Continue allergy  medications.  - Steam and humidifier can help - Stay well hydrated and get plenty of rest.  - Diflucan  given as prophylaxis as patient tends to get vaginal yeast infections with antibiotic use. - Seek in person evaluation if no symptom improvement or if symptoms worsen   Follow Up Instructions: I discussed the assessment and treatment plan with  the patient. The patient was provided an opportunity to ask questions and all were answered. The patient agreed with the plan and demonstrated an understanding of the instructions.  A copy of instructions were sent to the patient via MyChart unless otherwise noted below.    The patient was advised to call back or seek an in-person evaluation if the symptoms worsen or if the condition fails to improve as anticipated.    Marie CHRISTELLA Dickinson, PA-C

## 2023-09-26 ENCOUNTER — Encounter: Payer: Self-pay | Admitting: Nurse Practitioner

## 2023-09-26 ENCOUNTER — Ambulatory Visit (INDEPENDENT_AMBULATORY_CARE_PROVIDER_SITE_OTHER): Payer: Self-pay | Admitting: Nurse Practitioner

## 2023-09-26 VITALS — BP 130/80 | HR 79 | Temp 98.8°F | Ht 65.0 in | Wt 187.0 lb

## 2023-09-26 DIAGNOSIS — R4589 Other symptoms and signs involving emotional state: Secondary | ICD-10-CM

## 2023-09-26 DIAGNOSIS — R200 Anesthesia of skin: Secondary | ICD-10-CM

## 2023-09-26 DIAGNOSIS — R7303 Prediabetes: Secondary | ICD-10-CM | POA: Diagnosis not present

## 2023-09-26 DIAGNOSIS — Z0000 Encounter for general adult medical examination without abnormal findings: Secondary | ICD-10-CM | POA: Diagnosis not present

## 2023-09-26 DIAGNOSIS — E66811 Obesity, class 1: Secondary | ICD-10-CM

## 2023-09-26 DIAGNOSIS — G4709 Other insomnia: Secondary | ICD-10-CM

## 2023-09-26 DIAGNOSIS — R202 Paresthesia of skin: Secondary | ICD-10-CM | POA: Diagnosis not present

## 2023-09-26 DIAGNOSIS — Z6831 Body mass index (BMI) 31.0-31.9, adult: Secondary | ICD-10-CM

## 2023-09-26 DIAGNOSIS — Z13220 Encounter for screening for lipoid disorders: Secondary | ICD-10-CM

## 2023-09-26 DIAGNOSIS — E6609 Other obesity due to excess calories: Secondary | ICD-10-CM

## 2023-09-26 DIAGNOSIS — Z136 Encounter for screening for cardiovascular disorders: Secondary | ICD-10-CM

## 2023-09-26 DIAGNOSIS — M5090 Cervical disc disorder, unspecified, unspecified cervical region: Secondary | ICD-10-CM

## 2023-09-26 MED ORDER — WEGOVY 0.5 MG/0.5ML ~~LOC~~ SOAJ
0.5000 mg | SUBCUTANEOUS | 1 refills | Status: DC
Start: 2023-09-26 — End: 2023-12-28

## 2023-09-26 MED ORDER — MONTELUKAST SODIUM 10 MG PO TABS: 10.0000 mg | ORAL_TABLET | Freq: Every day | ORAL | 1 refills | Status: DC

## 2023-09-26 MED ORDER — SERTRALINE HCL 25 MG PO TABS
25.0000 mg | ORAL_TABLET | Freq: Every day | ORAL | 1 refills | Status: AC
Start: 2023-09-26 — End: ?

## 2023-09-26 NOTE — Progress Notes (Signed)
 LILLETTE Kristeen JINNY Gladis, CMA,acting as a Neurosurgeon for Marie Ada, FNP.,have documented all relevant documentation on the behalf of Marie Ada, FNP,as directed by  Marie Ada, FNP while in the presence of Marie Ada, FNP.  Subjective:  Patient ID: Marie Harper , female    DOB: 1975/09/06 , 48 y.o.   MRN: [id number]  Chief Complaint  Patient presents with   Annual Exam    Patient presents today for HM, Patient reports compliance with medication. Patient denies any chest pain, SOB, or headaches.    Shoulder Pain    Patient reports she has been having shoulder pain that radiates down to her left arm. She reports she is unable to sleep due to the pain.    HPI Discussed the use of AI scribe software for clinical note transcription with the patient, who gave verbal consent to proceed.  History of Present Illness Marie Harper is a 48 year old female who presents with worsening shoulder and arm pain affecting sleep.  She describes worsening shoulder and arm pain that disrupts her sleep, requiring specific sleeping positions. The pain has progressed to include numbness and a needle-like sensation. She experiences difficulty with daily activities, such as dropping items, but has no issues with lifting objects overhead. She reports that previous x-rays were performed on her neck, but she did not receive a response or further instructions regarding the results. A nerve conduction study was performed at [facility] in [place], but she did not receive follow-up or results. She has tried meloxicam for pain management without significant relief.  She has a history of prediabetes and has been prescribed Wegovy  for weight management. Initially, she obtained Wegovy  through an outside pharmacy at a cost of $399. Her new insurance, [employer], started on August 1st, and she is uncertain about coverage for Wegovy  under the new plan. She previously attempted weight loss through a clinic but  discontinued after receiving the Wegovy  prescription.  She reports sinus issues, including a raspy voice and a previous yeast infection following antibiotic use. She does not currently feel sick but has experienced a nasty taste and smell, which she attributes to sinus problems. She is not taking any medication for postnasal drainage at present.  She is currently taking sertraline  for mood and doxepin for sleep, which she finds effective. She intermittently uses meloxicam for pain and has tried magnesium for muscle spasms. She drinks 64 to 68 ounces of water daily to manage periodic constipation. No shortness of breath, chest pain, or issues with lifting objects overhead. She reports periodic constipation but no diarrhea. She has a history of using Singulair  for allergies but is not currently taking it.   She is not exercising on a regular basis. She has been trying      Past Medical History:  Diagnosis Date   Allergy     seasonal   Anemia    Anxiety    Arthritis    Asthma    Chronic kidney disease    Depression 2020   GERD (gastroesophageal reflux disease)    Headache(784.0)    has not had migraine in long time   Hemorrhage of anus and rectum 04/28/2022   Rectal bleeding 04/28/2022   Ulcer      Family History  Problem Relation Age of Onset   Diabetes Mother    Cancer Maternal Aunt        nasal/sinus   Asthma Paternal Aunt    Emphysema Paternal Uncle  smoked   Diabetes Maternal Grandmother    Heart disease Maternal Grandmother    Arthritis Maternal Grandmother    Diabetes Paternal Grandmother    Arthritis Other    Kidney disease Other    Diabetes Other    Anxiety disorder Paternal Aunt    Anesthesia problems Neg Hx      Current Outpatient Medications:    albuterol  (VENTOLIN  HFA) 108 (90 Base) MCG/ACT inhaler, INHALE 1-2 PUFFS INTO THE LUNGS EVERY 4 HOURS AS NEEDED FOR WHEEZING OR SHORTNESS OF BREATH., Disp: 8.5 each, Rfl: 1   Albuterol -Budesonide  (AIRSUPRA )  90-80 MCG/ACT AERO, Inhale 2 puffs into the lungs every 4 (four) hours as needed., Disp: 10.7 g, Rfl: 11   ALTRENO  0.05 % LOTN, Apply 1 Application topically at bedtime. For the first month apply 2 nights weekly, after 1 month apply 3 nights weekly, Disp: 20 g, Rfl: 5   buPROPion  (WELLBUTRIN  XL) 300 MG 24 hr tablet, Take 300 mg by mouth daily., Disp: , Rfl:    clindamycin  (CLEOCIN  T) 1 % SWAB, Apply 1 Application topically daily. Swab face in the morning after washing, Disp: 69 each, Rfl: 6   Doxepin HCl 6 MG TABS, TAKE 1/2 TAB ORAL AT BEDTIME FOR INSOMNIA, Disp: 45 tablet, Rfl: 1   fexofenadine (ALLEGRA) 180 MG tablet, Take 1 tablet by mouth daily., Disp: , Rfl:    levonorgestrel (MIRENA, 52 MG,) 20 MCG/24HR IUD, Mirena 20 mcg/24 hours (7 yrs) 52 mg intrauterine device  Take 1 device by intrauterine route., Disp: , Rfl:    linaclotide (LINZESS) 145 MCG CAPS capsule, 1 cap(s) orally once a day for 90, Disp: , Rfl:    meloxicam (MOBIC) 15 MG tablet, TAKE 1 TABLET BY MOUTH EVERY DAY, Disp: 30 tablet, Rfl: 1   mometasone  (ELOCON ) 0.1 % ointment, Apply topically daily. Apply 2 times daily for 2 weeks then STOP and take a break for 2 WEEKS, Disp: 45 g, Rfl: 11   Semaglutide -Weight Management (WEGOVY ) 0.5 MG/0.5ML SOAJ, Inject 0.5 mg into the skin once a week., Disp: 2 mL, Rfl: 1   SUMAtriptan  (IMITREX ) 50 MG tablet, TAKE 1 TABLET BY MOUTH ONCE AS NEEDED FOR MIGRAINE. MAY REPEAT IN 2 HOURS IF HEADACHE PERSISTS OR RECURS., Disp: 27 tablet, Rfl: 3   tiZANidine  (ZANAFLEX ) 4 MG tablet, Take 1 tablet (4 mg total) by mouth 2 (two) times daily as needed for muscle spasms., Disp: 30 tablet, Rfl: 0   traMADol  (ULTRAM ) 50 MG tablet, Take 1 tablet (50 mg total) by mouth every 6 (six) hours as needed., Disp: 20 tablet, Rfl: 0   montelukast  (SINGULAIR ) 10 MG tablet, Take 1 tablet (10 mg total) by mouth at bedtime., Disp: 90 tablet, Rfl: 1   sertraline  (ZOLOFT ) 25 MG tablet, Take 1 tablet (25 mg total) by mouth daily.,  Disp: 90 tablet, Rfl: 1   No Known Allergies   Review of Systems  Constitutional: Negative.   HENT: Negative.    Eyes: Negative.   Respiratory: Negative.    Cardiovascular: Negative.   Gastrointestinal: Negative.   Endocrine: Negative.   Genitourinary: Negative.   Musculoskeletal: Negative.   Skin: Negative.   Allergic/Immunologic: Negative.   Neurological: Negative.   Hematological: Negative.   Psychiatric/Behavioral: Negative.       Today's Vitals   09/26/23 1543  BP: 130/80  Pulse: 79  Temp: 98.8 F (37.1 C)  TempSrc: Oral  Weight: 187 lb (84.8 kg)  Height: 5' 5 (1.651 m)  PainSc: 5   PainLoc:  Shoulder   Body mass index is 31.12 kg/m.  Wt Readings from Last 3 Encounters:  09/26/23 187 lb (84.8 kg)  08/31/23 183 lb (83 kg)  08/11/23 181 lb 9.6 oz (82.4 kg)     Objective:  Physical Exam Vitals and nursing note reviewed.  Constitutional:      General: She is not in acute distress.    Appearance: Normal appearance. She is well-developed. She is obese.  HENT:     Head: Normocephalic and atraumatic.     Right Ear: Hearing, tympanic membrane, ear canal and external ear normal. There is no impacted cerumen.     Left Ear: Hearing, tympanic membrane, ear canal and external ear normal. There is no impacted cerumen.     Nose: Nose normal.     Mouth/Throat:     Mouth: Mucous membranes are moist.  Eyes:     General: Lids are normal.     Extraocular Movements: Extraocular movements intact.     Conjunctiva/sclera: Conjunctivae normal.     Pupils: Pupils are equal, round, and reactive to light.     Funduscopic exam:    Right eye: No papilledema.        Left eye: No papilledema.  Neck:     Thyroid : No thyroid  mass.     Vascular: No carotid bruit.  Cardiovascular:     Rate and Rhythm: Normal rate and regular rhythm.     Pulses: Normal pulses.     Heart sounds: Normal heart sounds. No murmur heard. Pulmonary:     Effort: Pulmonary effort is normal. No respiratory  distress.     Breath sounds: Normal breath sounds. No wheezing.  Chest:     Chest wall: No mass.  Breasts:    Tanner Score is 5.     Right: Normal. No mass or tenderness.     Left: Normal. No mass or tenderness.     Comments: Lump palpated to right axilla Abdominal:     General: Abdomen is flat. Bowel sounds are normal. There is no distension.     Palpations: Abdomen is soft.     Tenderness: There is no abdominal tenderness.  Genitourinary:    Rectum: Guaiac result negative.  Musculoskeletal:        General: No swelling or tenderness. Normal range of motion.     Cervical back: Full passive range of motion without pain, normal range of motion and neck supple.     Right lower leg: No edema.     Left lower leg: No edema.  Lymphadenopathy:     Upper Body:     Right upper body: No supraclavicular, axillary or pectoral adenopathy.     Left upper body: No supraclavicular, axillary or pectoral adenopathy.  Skin:    General: Skin is warm and dry.     Capillary Refill: Capillary refill takes less than 2 seconds.  Neurological:     General: No focal deficit present.     Mental Status: She is alert and oriented to person, place, and time.     Cranial Nerves: No cranial nerve deficit.     Sensory: No sensory deficit.     Motor: No weakness.  Psychiatric:        Mood and Affect: Mood normal.        Behavior: Behavior normal.        Thought Content: Thought content normal.        Judgment: Judgment normal.        Assessment And Plan:  Encounter for annual health examination Assessment & Plan: Behavior modifications discussed and diet history reviewed.   Pt will continue to exercise regularly and modify diet with low GI, plant based foods and decrease intake of processed foods.  Recommend intake of daily multivitamin, Vitamin D, and calcium.  Recommend mammogram and colonoscopy (up to date) for preventive screenings, as well as recommend immunizations that include influenza,  TDAP   Orders: -     CMP14+EGFR -     CBC  Prediabetes Assessment & Plan: Prediabetes noted as a comorbidity supporting insurance coverage for Wegovy . A1c is stable.   Orders: -     Wegovy ; Inject 0.5 mg into the skin once a week.  Dispense: 2 mL; Refill: 1 -     Hemoglobin A1c  Other insomnia Assessment & Plan: Insomnia managed with doxepin, effective in improving sleep despite arm pain disruption. - Continue doxepin at current dose.  Orders: -     Sertraline  HCl; Take 1 tablet (25 mg total) by mouth daily.  Dispense: 90 tablet; Refill: 1  Numbness and tingling in left arm -     Ambulatory referral to Neurology  Cervical neck pain with evidence of disc disease Assessment & Plan: Chronic pain and paresthesia likely due to cervical radiculopathy, with potential nerve compression requiring neurosurgical evaluation. - Refer to neurology for evaluation and potential MRI. - Request previous nerve conduction study results from previous provider. - Advise meloxicam as needed for pain, cautioning about gastrointestinal side effects.  Orders: -     Ambulatory referral to Neurology  Depressed mood Assessment & Plan: Depression managed with regular sertraline  use. - Refill sertraline  prescription.  Orders: -     Sertraline  HCl; Take 1 tablet (25 mg total) by mouth daily.  Dispense: 90 tablet; Refill: 1  Class 1 obesity due to excess calories without serious comorbidity with body mass index (BMI) of 31.0 to 31.9 in adult Assessment & Plan: Discussed weight loss strategies and potential insurance coverage for Wegovy  with new insurance if comorbidities are present. - Continue Wegovy  prescription, attempt processing through regular pharmacy. - Encourage follow-up with weight loss clinic for dietary and exercise guidance.  Orders: -     Wegovy ; Inject 0.5 mg into the skin once a week.  Dispense: 2 mL; Refill: 1  Encounter for lipid screening for cardiovascular disease -      Lipid panel  Other orders -     Montelukast  Sodium; Take 1 tablet (10 mg total) by mouth at bedtime.  Dispense: 90 tablet; Refill: 1     Return for 1 year physical, 19m PRE DM.  Patient was given opportunity to ask questions. Patient verbalized understanding of the plan and was able to repeat key elements of the plan. All questions were answered to their satisfaction.   LILLETTE Marie Ada, FNP, have reviewed all documentation for this visit. The documentation on 09/26/23 for the exam, diagnosis, procedures, and orders are all accurate and complete.    IF YOU HAVE BEEN REFERRED TO A SPECIALIST, IT MAY TAKE 1-2 WEEKS TO SCHEDULE/PROCESS THE REFERRAL. IF YOU HAVE NOT HEARD FROM US /SPECIALIST IN TWO WEEKS, PLEASE GIVE US  A CALL AT [phone] X [phone].

## 2023-09-27 LAB — LIPID PANEL
Chol/HDL Ratio: 3.5 ratio (ref 0.0–4.4)
Cholesterol, Total: 148 mg/dL (ref 100–199)
HDL: 42 mg/dL (ref >39)
LDL Chol Calc (NIH): 73 mg/dL (ref 0–99)
Triglycerides: 198 mg/dL — ABNORMAL HIGH (ref 0–149)
VLDL Cholesterol Cal: 33 mg/dL (ref 5–40)

## 2023-09-27 LAB — CBC
Hematocrit: 37.7 % (ref 34.0–46.6)
Hemoglobin: 12.1 g/dL (ref 11.1–15.9)
MCH: 27.9 pg (ref 26.6–33.0)
MCHC: 32.1 g/dL (ref 31.5–35.7)
MCV: 87 fL (ref 79–97)
Platelets: 232 x10E3/uL (ref 150–450)
RBC: 4.33 x10E6/uL (ref 3.77–5.28)
RDW: 14.3 % (ref 11.7–15.4)
WBC: 6.9 x10E3/uL (ref 3.4–10.8)

## 2023-09-27 LAB — HEMOGLOBIN A1C
Est. average glucose Bld gHb Est-mCnc: 108 mg/dL
Hgb A1c MFr Bld: 5.4 % (ref 4.8–5.6)

## 2023-09-27 LAB — CMP14+EGFR
ALT: 23 IU/L (ref 0–32)
AST: 24 IU/L (ref 0–40)
Albumin: 4.5 g/dL (ref 3.9–4.9)
Alkaline Phosphatase: 39 IU/L — ABNORMAL LOW (ref 44–121)
BUN/Creatinine Ratio: 14 (ref 9–23)
BUN: 12 mg/dL (ref 6–24)
Bilirubin Total: 0.3 mg/dL (ref 0.0–1.2)
CO2: 23 mmol/L (ref 20–29)
Calcium: 9.3 mg/dL (ref 8.7–10.2)
Chloride: 104 mmol/L (ref 96–106)
Creatinine, Ser: 0.84 mg/dL (ref 0.57–1.00)
Globulin, Total: 2.7 g/dL (ref 1.5–4.5)
Glucose: 85 mg/dL (ref 70–99)
Potassium: 4.5 mmol/L (ref 3.5–5.2)
Sodium: 140 mmol/L (ref 134–144)
Total Protein: 7.2 g/dL (ref 6.0–8.5)
eGFR: 87 mL/min/1.73 (ref >59)

## 2023-10-03 DIAGNOSIS — Z0289 Encounter for other administrative examinations: Secondary | ICD-10-CM

## 2023-10-04 ENCOUNTER — Other Ambulatory Visit: Payer: Self-pay | Admitting: Nurse Practitioner

## 2023-10-04 DIAGNOSIS — R7303 Prediabetes: Secondary | ICD-10-CM

## 2023-10-04 DIAGNOSIS — E66811 Obesity, class 1: Secondary | ICD-10-CM

## 2023-10-10 ENCOUNTER — Ambulatory Visit: Payer: Self-pay | Admitting: Nurse Practitioner

## 2023-10-10 DIAGNOSIS — E66811 Obesity, class 1: Secondary | ICD-10-CM | POA: Insufficient documentation

## 2023-10-10 NOTE — Assessment & Plan Note (Signed)
 Depression managed with regular sertraline  use. - Refill sertraline  prescription.

## 2023-10-10 NOTE — Assessment & Plan Note (Signed)
 Chronic pain and paresthesia likely due to cervical radiculopathy, with potential nerve compression requiring neurosurgical evaluation. - Refer to neurology for evaluation and potential MRI. - Request previous nerve conduction study results from previous provider. - Advise meloxicam as needed for pain, cautioning about gastrointestinal side effects.

## 2023-10-10 NOTE — Assessment & Plan Note (Signed)
 Discussed weight loss strategies and potential insurance coverage for Wegovy  with new insurance if comorbidities are present. - Continue Wegovy  prescription, attempt processing through regular pharmacy. - Encourage follow-up with weight loss clinic for dietary and exercise guidance.

## 2023-10-10 NOTE — Assessment & Plan Note (Signed)
 Insomnia managed with doxepin, effective in improving sleep despite arm pain disruption. - Continue doxepin at current dose.

## 2023-10-10 NOTE — Assessment & Plan Note (Signed)
 Behavior modifications discussed and diet history reviewed.   Pt will continue to exercise regularly and modify diet with low GI, plant based foods and decrease intake of processed foods.  Recommend intake of daily multivitamin, Vitamin D, and calcium.  Recommend mammogram and colonoscopy (up to date) for preventive screenings, as well as recommend immunizations that include influenza, TDAP

## 2023-10-10 NOTE — Assessment & Plan Note (Signed)
 Prediabetes noted as a comorbidity supporting insurance coverage for Wegovy . A1c is stable.

## 2023-10-12 ENCOUNTER — Encounter (INDEPENDENT_AMBULATORY_CARE_PROVIDER_SITE_OTHER): Payer: Self-pay | Admitting: Nurse Practitioner

## 2023-10-12 ENCOUNTER — Ambulatory Visit: Admitting: Nurse Practitioner

## 2023-10-12 ENCOUNTER — Encounter (INDEPENDENT_AMBULATORY_CARE_PROVIDER_SITE_OTHER): Payer: Self-pay

## 2023-10-19 NOTE — Progress Notes (Unsigned)
 [address] [place],  [place], [place] [address]  Office: [phone]  /  Fax: [phone]   Subjective   Initial Visit  Marie Harper (MR# [id number]) is a 48 y.o. female who presents for evaluation and treatment of obesity and related comorbidities. Current BMI is There is no height or weight on file to calculate BMI. [name] has been struggling with her weight for many years and has been unsuccessful in either losing weight, maintaining weight loss, or reaching her healthy weight goal.  [name] is currently in the action stage of change and ready to dedicate time achieving and maintaining a healthier weight. [name] is interested in becoming our patient and working on intensive lifestyle modifications including (but not limited to) diet and exercise for weight loss.  Had sleep study in 2022 which showed mild obstructive sleep apnea AHI 6/hr. Moderate O2 desaturation(Min O2- 82%). No CPAP recommended  Weight history:  When asked how their weight has affected their life and health, she states: {EMWeightAffected:28305}  When asked what else they would like to accomplish? She states: {EMHopetoaccomplish:28304::Adopt a healthier eating pattern and lifestyle,Improve energy levels and physical activity,Improve existing medical conditions,Improve quality of life}  She starting to note weight gain during : {emstartedtogainweight:31588}.  Life events associated with weight gain include : {emlifeeventsweighgain:31589}.   Other contributing factors: {EMcontributingfactors:28307}.  Their highest weight has been:  *** lbs.  Desired weight: ***  Previous weight-loss programs : {emweightlossprograms:31590::None}.  Their maximum weight loss was:  *** lbs.  Their greatest challenge with dieting: {emgreatestchallengediet:31593}.  Current or previous pharmacotherapy: {EM previousRx:28311}.  Response to medication: {EMResponsetomedication:28312}   Nutritional  History:  Current nutrition plan: {EMNutritionplan:28309::None}.  How many times do you eat outside the home: {emfrequency:31645}  How often do they skip meals: {emskipmeals:31594::does not skip meals}  What beverages do they drink: {embeverages:31595}.   Use of artificial sweetners : {Yes/No:30480221}  Food intolerances or dislikes: {emfoodintolerance:31596::none}.  Food triggers: {emfoodtriggers:31600::None}.  Food cravings: {emfoodcravings:31601}  Do they struggle with excessive hunger or portion control : {YES/NO:21197}   Physical Activity:  Current level of physical activity: {EMcurrentPA:28310::None}  Barriers to Exercise: {embarrierstoexercise:32606::no barriers}   Past medical history includes:   Past Medical History:  Diagnosis Date   Allergy     seasonal   Anemia    Anxiety    Arthritis    Asthma    Chronic kidney disease    Depression 2020   GERD (gastroesophageal reflux disease)    Headache(784.0)    has not had migraine in long time   Hemorrhage of anus and rectum 04/28/2022   Rectal bleeding 04/28/2022   Ulcer      Objective   LMP 09/24/2023  She was weighed on the bioimpedance scale: There is no height or weight on file to calculate BMI.    Anthropometrics:  No data recorded No data recorded No data recorded No data recorded  Physical Exam:  General: She is overweight, cooperative, alert, well developed, and in no acute distress. PSYCH: Has normal mood, affect and thought process.   HEENT: EOMI, sclerae are anicteric. Lungs: Normal breathing effort, no conversational dyspnea. Extremities: No edema.  Neurologic: No gross sensory or motor deficits. No tremors or fasciculations noted.    Diagnostic Data Reviewed  EKG: Normal sinus rhythm, rate ***. No conduction abnormalities, abnormal Q waves or chamber enlargement.  Indirect Calorimeter completed today shows a VO2 of *** and a REE of ***.  Her calculated basal  metabolic rate is *** thus her {zfprmzdlou:68397}.  Depression Screen  Marney's PHQ-9 score was: ***.     08/11/2023   10:16 AM  Depression screen PHQ 2/9  Decreased Interest 0  Down, Depressed, Hopeless 0  PHQ - 2 Score 0  Altered sleeping 0  Tired, decreased energy 0  Change in appetite 0  Feeling bad or failure about yourself  0  Trouble concentrating 0  Moving slowly or fidgety/restless 0  Suicidal thoughts 0  PHQ-9 Score 0  Difficult doing work/chores Not difficult at all    Screening for Sleep Related Breathing Disorders  Khyla {Actions; denies/reports/admits to:19208} daytime somnolence and {Actions; denies/reports/admits to:19208} waking up still tired. Patient has a history of symptoms of {OSA Sx:17850}. Takira generally gets {numbers (fuzzy):14653} hours of sleep per night, and states that she has {sleep quality:17851}. Snoring {is/are not:32546} present. Apneic episodes {is/are not:32546} present. Epworth Sleepiness Score is ***.   BMET    Component Value Date/Time   NA 140 09/26/2023 1644   K 4.5 09/26/2023 1644   CL 104 09/26/2023 1644   CO2 23 09/26/2023 1644   GLUCOSE 85 09/26/2023 1644   GLUCOSE 125 (H) 09/08/2016 0333   BUN 12 09/26/2023 1644   CREATININE 0.84 09/26/2023 1644   CALCIUM 9.3 09/26/2023 1644   GFRNONAA >60 09/08/2016 0333   GFRAA >60 09/08/2016 0333   Lab Results  Component Value Date   HGBA1C 5.4 09/26/2023   HGBA1C 5.5 02/18/2011   Lab Results  Component Value Date   INSULIN  31.5 (H) 02/25/2022   CBC    Component Value Date/Time   WBC 6.9 09/26/2023 1644   WBC 11.7 (H) 11/22/2017 1104   RBC 4.33 09/26/2023 1644   RBC 4.57 11/22/2017 1104   HGB 12.1 09/26/2023 1644   HCT 37.7 09/26/2023 1644   PLT 232 09/26/2023 1644   MCV 87 09/26/2023 1644   MCH 27.9 09/26/2023 1644   MCH 27.3 09/08/2016 0333   MCHC 32.1 09/26/2023 1644   MCHC 33.5 11/22/2017 1104   RDW 14.3 09/26/2023 1644   Iron/TIBC/Ferritin/ %Sat     Component Value Date/Time   IRON 63 02/18/2011 1149   FERRITIN 77.0 02/18/2011 1149   IRONPCTSAT 20.4 02/18/2011 1149   Lipid Panel     Component Value Date/Time   CHOL 148 09/26/2023 1644   TRIG 198 (H) 09/26/2023 1644   HDL 42 09/26/2023 1644   CHOLHDL 3.5 09/26/2023 1644   LDLCALC 73 09/26/2023 1644   Hepatic Function Panel     Component Value Date/Time   PROT 7.2 09/26/2023 1644   ALBUMIN 4.5 09/26/2023 1644   AST 24 09/26/2023 1644   ALT 23 09/26/2023 1644   ALKPHOS 39 (L) 09/26/2023 1644   BILITOT 0.3 09/26/2023 1644      Component Value Date/Time   TSH 2.18 10/28/2022 1614     Assessment and Plan   TREATMENT PLAN FOR OBESITY:  Recommended Dietary Goals  Ciji is currently in the action stage of change. As such, her goal is to implement medically supervised obesity management plan.  She has agreed to implement: {emwtlossplannewint:31639}  Behavioral Intervention  We discussed the following Behavioral Modification Strategies today: {EMwtlossstrategiesnewint:31640::increasing lean protein intake to established goals,decreasing simple carbohydrates ,increasing vegetables,increasing lower glycemic fruits,increasing fiber rich foods,avoiding skipping meals,increasing water intake,work on meal planning and preparation,reading food labels ,keeping healthy foods at home,identifying sources and decreasing liquid calories,decreasing eating out or consumption of processed foods, and making healthy choices when eating convenient foods,planning for success,better snacking choices}  Additional resources provided today: {emadditionalresourcesnewint:32116::Handout on healthy  eating and balanced plate,Handout on complex carbohydrates and lean sources of protein,Handout principles of weight management}  Recommended Physical Activity Goals  Chele has been advised to work up to 150 minutes of moderate intensity aerobic activity a week and  strengthening exercises 2-3 times per week for cardiovascular health, weight loss maintenance and preservation of muscle mass.   She has agreed to :  {EMEXERCISE:28847::Think about enjoyable ways to increase daily physical activity and overcoming barriers to exercise,Increase physical activity in their day and reduce sedentary time (increase NEAT).,Increase volume of physical activity to a goal of 240 minutes a week,Combine aerobic and strengthening exercises for efficiency and improved cardiometabolic health.}  Medical Interventions and Pharmacotherapy We will work on building a Therapist, art and behavioral strategies. We will discuss the role of pharmacotherapy as an adjunct at subsequent visits.   ASSOCIATED CONDITIONS ADDRESSED TODAY  Other Fatigue ***. Tylin does feel that her weight is causing her energy to be lower than it should be. Fatigue may be related to obesity, depression or many other causes. Labs will be ordered, and in the meanwhile, Taiyana will focus on self care including making healthy food choices, increasing physical activity and focusing on stress reduction.  Shortness of Breath Mccartney notes increasing shortness of breath with physical activity and seems to be worsening over time with weight gain. She notes getting out of breath sooner with activity than she used to. This has not gotten worse recently. Caryssa denies shortness of breath at rest or orthopnea.  Prediabetes A1c 6.0 02/2022  Hypertriglyceridemia  Class 1 obesity without serious comorbidity with body mass index (BMI) of 31.0 to 31.9 in adult, unspecified obesity type  Cough variant asthma    Follow-up  She was informed of the importance of frequent follow-up visits to maximize her success with intensive lifestyle modifications for her multiple health conditions. She was informed we would discuss her lab results at her next visit unless there is a critical issue that  needs to be addressed sooner. Kaleena agreed to keep her next visit at the agreed upon time to discuss these results.  Attestation Statement  This is the patient's intake visit at [facility]. The patient's Health Questionnaire was reviewed at length. Included in the packet: current and past health history, medications, allergies, ROS, gynecologic history (women only), surgical history, family history, social history, weight history, weight loss surgery history (for those that have had weight loss surgery), nutritional evaluation, mood and food questionnaire, PHQ9, Epworth questionnaire, sleep habits questionnaire, patient life and health improvement goals questionnaire. These will all be scanned into the patient's chart under media.   During the visit, I independently reviewed the patient's EKG***, previous labs, bioimpedance scale results, and indirect calorimetry results. I used this information to medically tailor a meal plan for the patient that will help her to lose weight and will improve her obesity-related conditions. I performed a medically necessary appropriate examination and/or evaluation. I discussed the assessment and treatment plan with the patient. The patient was provided an opportunity to ask questions and all were answered. The patient agreed with the plan and demonstrated an understanding of the instructions. Labs were ordered at this visit and will be reviewed at the next visit unless critical results need to be addressed immediately. Clinical information was updated and documented in the EMR.   In addition, they received basic education on identification of processed foods and reduction of these, different sources of lean proteins and complex carbohydrates and how to  eat balanced by incorporation of whole foods.  Reviewed by clinician on day of visit: allergies, medications, problem list, medical history, surgical history, family history, social history, and previous  encounter notes.  I have spent *** minutes in the care of the patient today including: {NUMBER 1-10:22536} minutes before the visit reviewing and preparing the chart. *** minutes face-to-face {emfacetoface:32598::assessing and reviewing listed medical problems as outlined in obesity care plan,providing nutritional and behavioral counseling on topics outlined in the obesity care plan,independently interpreting test results and goals of care, as described in assessment and plan,reviewing and discussing biometric information and progress} {NUMBER 1-10:22536} minutes after the visit updating chart and documentation of encounter.       Raha Tennison ANP-C

## 2023-10-20 ENCOUNTER — Ambulatory Visit (INDEPENDENT_AMBULATORY_CARE_PROVIDER_SITE_OTHER): Admitting: Nurse Practitioner

## 2023-10-20 ENCOUNTER — Encounter (INDEPENDENT_AMBULATORY_CARE_PROVIDER_SITE_OTHER): Payer: Self-pay | Admitting: Nurse Practitioner

## 2023-10-20 VITALS — BP 117/78 | HR 67 | Temp 98.7°F | Ht 65.0 in | Wt 179.0 lb

## 2023-10-20 DIAGNOSIS — R5383 Other fatigue: Secondary | ICD-10-CM

## 2023-10-20 DIAGNOSIS — E781 Pure hyperglyceridemia: Secondary | ICD-10-CM | POA: Diagnosis not present

## 2023-10-20 DIAGNOSIS — Z6829 Body mass index (BMI) 29.0-29.9, adult: Secondary | ICD-10-CM

## 2023-10-20 DIAGNOSIS — R0602 Shortness of breath: Secondary | ICD-10-CM

## 2023-10-20 DIAGNOSIS — Z1331 Encounter for screening for depression: Secondary | ICD-10-CM

## 2023-10-20 DIAGNOSIS — R7303 Prediabetes: Secondary | ICD-10-CM

## 2023-10-20 DIAGNOSIS — J45991 Cough variant asthma: Secondary | ICD-10-CM

## 2023-10-20 DIAGNOSIS — E66811 Obesity, class 1: Secondary | ICD-10-CM

## 2023-10-20 DIAGNOSIS — E559 Vitamin D deficiency, unspecified: Secondary | ICD-10-CM

## 2023-10-20 DIAGNOSIS — F339 Major depressive disorder, recurrent, unspecified: Secondary | ICD-10-CM

## 2023-10-20 DIAGNOSIS — Z6831 Body mass index (BMI) 31.0-31.9, adult: Secondary | ICD-10-CM

## 2023-10-21 LAB — VITAMIN B12: Vitamin B-12: 840 pg/mL (ref 232–1245)

## 2023-10-21 LAB — BASIC METABOLIC PANEL WITH GFR
BUN/Creatinine Ratio: 16 (ref 9–23)
BUN: 12 mg/dL (ref 6–24)
CO2: 19 mmol/L — ABNORMAL LOW (ref 20–29)
Calcium: 9.2 mg/dL (ref 8.7–10.2)
Chloride: 103 mmol/L (ref 96–106)
Creatinine, Ser: 0.76 mg/dL (ref 0.57–1.00)
Glucose: 79 mg/dL (ref 70–99)
Potassium: 4.2 mmol/L (ref 3.5–5.2)
Sodium: 139 mmol/L (ref 134–144)
eGFR: 98 mL/min/1.73 (ref >59)

## 2023-10-21 LAB — INSULIN, RANDOM: INSULIN: 11.5 u[IU]/mL (ref 2.6–24.9)

## 2023-10-21 LAB — VITAMIN D 25 HYDROXY (VIT D DEFICIENCY, FRACTURES): Vit D, 25-Hydroxy: 35.1 ng/mL (ref 30.0–100.0)

## 2023-10-21 LAB — TSH: TSH: 1.28 u[IU]/mL (ref 0.450–4.500)

## 2023-10-25 ENCOUNTER — Ambulatory Visit (INDEPENDENT_AMBULATORY_CARE_PROVIDER_SITE_OTHER): Payer: Self-pay | Admitting: Nurse Practitioner

## 2023-11-03 ENCOUNTER — Ambulatory Visit (INDEPENDENT_AMBULATORY_CARE_PROVIDER_SITE_OTHER): Admitting: Nurse Practitioner

## 2023-11-03 ENCOUNTER — Encounter (INDEPENDENT_AMBULATORY_CARE_PROVIDER_SITE_OTHER): Payer: Self-pay | Admitting: Nurse Practitioner

## 2023-11-03 VITALS — BP 122/78 | HR 78 | Temp 98.5°F | Ht 65.0 in | Wt 182.0 lb

## 2023-11-03 DIAGNOSIS — E559 Vitamin D deficiency, unspecified: Secondary | ICD-10-CM

## 2023-11-03 DIAGNOSIS — R7303 Prediabetes: Secondary | ICD-10-CM

## 2023-11-03 DIAGNOSIS — E88819 Insulin resistance, unspecified: Secondary | ICD-10-CM | POA: Diagnosis not present

## 2023-11-03 DIAGNOSIS — E781 Pure hyperglyceridemia: Secondary | ICD-10-CM | POA: Diagnosis not present

## 2023-11-03 DIAGNOSIS — Z6830 Body mass index (BMI) 30.0-30.9, adult: Secondary | ICD-10-CM

## 2023-11-03 DIAGNOSIS — E66811 Obesity, class 1: Secondary | ICD-10-CM | POA: Diagnosis not present

## 2023-11-03 MED ORDER — METFORMIN HCL ER 500 MG PO TB24: 500.0000 mg | ORAL_TABLET | Freq: Every day | ORAL | 0 refills | Status: DC

## 2023-11-03 MED ORDER — VITAMIN D (ERGOCALCIFEROL) 1.25 MG (50000 UNIT) PO CAPS: 50000.0000 [IU] | ORAL_CAPSULE | ORAL | 1 refills | Status: DC

## 2023-11-03 NOTE — Progress Notes (Signed)
 Office: [phone]  /  Fax: [phone]  WEIGHT SUMMARY AND BIOMETRICS  Weight Lost Since Last Visit: 0lb  Weight Gained Since Last Visit: 3lb   Vitals Temp: 98.5 F (36.9 C) BP: 122/78 Pulse Rate: 78 SpO2: 100 %   Anthropometric Measurements Height: 5' 5 (1.651 m) Weight: 182 lb (82.6 kg) BMI (Calculated): 30.29 Weight at Last Visit: 179lb Weight Lost Since Last Visit: 0lb Weight Gained Since Last Visit: 3lb Starting Weight: 179lb Total Weight Loss (lbs): 0 lb (0 kg) Peak Weight: 189lb   Body Composition  Body Fat %: 37.1 % Fat Mass (lbs): 67.6 lbs Muscle Mass (lbs): 108.8 lbs Total Body Water (lbs): 76.8 lbs Visceral Fat Rating : 8   Other Clinical Data Fasting: no Labs: no Today's Visit #: 2 Starting Date: 10/20/23    Total Weight Loss: +3 pounds  Bio Impedence Data: Increase of 0.8 pounds of muscle and increase of 1.4 pounds of adipose   Cancer screenings- reviewed with patient Pap Smear: 04/21/22 negative Mammogram: 10/20/22 Negative Colonoscopy: 03/13/2018 due 2030  The 10-year ASCVD risk score (Arnett DK, et al., 2019) is: 1% - low risk   Values used to calculate the score:     Age: 48 years     Clincally relevant sex: Female     Is Non-Hispanic African American: Yes     Diabetic: No     Tobacco smoker: No     Systolic Blood Pressure: 117 mmHg     Is BP treated: No     HDL Cholesterol: 42 mg/dL     Total Cholesterol: 148 mg/dL   HPI  Chief Complaint: OBESITY  Marie Harper is here to discuss her progress with her obesity treatment plan. She is on the the Category 1 Plan and states she is following her eating plan approximately 50 % of the time. She states she is exercising 40 minutes 3-4 days per week.  Marie Harper does have prediabetes with A1c 6.0 in 2024. Her most recent labwork showed a HOMA-IR score of 2.24 showing insulin  resistance, fasting insulin  11.5.  Most recent labs have revealed hypertriglyceridemia with triglycerides at 198.  Vit D is low normal at 35.1.   Interval History:  Since last office visit she has been following plan but did go on vacation x 4 days and did splurge. She did stop sweet drinks. Protein intake has been around 60 grams. She has been having difficulty making dinners to plan- instructed today on CHatGPT and [email or web address] Breakfast- toast and 1 egg with bacon Lunch : ham and cheese sandwich  and activia yogurt Dinner: crescent rolls around hot dog and cheese    PHYSICAL EXAM:  Blood pressure 122/78, pulse 78, temperature 98.5 F (36.9 C), height 5' 5 (1.651 m), weight 182 lb (82.6 kg), last menstrual period 10/20/2023, SpO2 100%. Body mass index is 30.29 kg/m.  General: Well Developed, well nourished, and in no acute distress.  HEENT: Normocephalic, atraumatic; EOMI, sclerae are anicteric. Skin: Warm and dry, good turgor Chest:  Normal excursion, shape, no gross ABN Respiratory: No conversational dyspnea; speaking in full sentences NeuroM-Sk:  Normal gross ROM all 4 extremities  Psych: A and O X 3, insight adequate, mood- full    DIAGNOSTIC DATA REVIEWED: ALL LABS REVIEWED WITH PATIENT IN DETAIL  Last metabolic panel Lab Results  Component Value Date   GLUCOSE 79 10/20/2023   NA 139 10/20/2023   K 4.2 10/20/2023   CL 103 10/20/2023   CO2 19 (L) 10/20/2023   BUN 12  10/20/2023   CREATININE 0.76 10/20/2023   EGFR 98 10/20/2023   CALCIUM 9.2 10/20/2023   PROT 7.2 09/26/2023   ALBUMIN 4.5 09/26/2023   LABGLOB 2.7 09/26/2023   AGRATIO 1.6 02/25/2022   BILITOT 0.3 09/26/2023   ALKPHOS 39 (L) 09/26/2023   AST 24 09/26/2023   ALT 23 09/26/2023   ANIONGAP 8 09/08/2016   Normal electrolytes, kidney and liver functions  Lab Results  Component Value Date   HGBA1C 5.4 09/26/2023   HGBA1C 5.5 02/18/2011  A1c in normal range  Lab Results  Component Value Date   INSULIN  11.5 10/20/2023   INSULIN  31.5 (H) 02/25/2022  HOMA-IR 2.24 insulin  resistance  Lab Results   Component Value Date   TSH 1.280 10/20/2023  Thyroid  is in normal range  Last CBC Lab Results  Component Value Date   WBC 6.9 09/26/2023   HGB 12.1 09/26/2023   HCT 37.7 09/26/2023   MCV 87 09/26/2023   MCH 27.9 09/26/2023   RDW 14.3 09/26/2023   PLT 232 09/26/2023   Normal CBC  Lipid Panel     Component Value Date/Time   CHOL 148 09/26/2023 1644   TRIG 198 (H) 09/26/2023 1644   HDL 42 09/26/2023 1644   CHOLHDL 3.5 09/26/2023 1644   LDLCALC 73 09/26/2023 1644  Elevated Triglycerides   Nutritional Lab Results  Component Value Date   VD25OH 35.1 10/20/2023  Vit D is low normal- she does over the counter Vit D3   Lab Results  Component Value Date   VITAMINB12 840 10/20/2023   Vit B12 in normal range   ASSESSMENT AND PLAN  TREATMENT PLAN FOR OBESITY:  Recommended Dietary Goals  Scheryl is currently in the action stage of change. As such, her goal is to continue weight management plan. She has agreed to the Category 1 Plan.  Behavioral Intervention  We discussed the following Behavioral Modification Strategies today: increasing lean protein intake to established goals, continue to work on maintaining a reduced calorie state, getting the recommended amount of protein, incorporating whole foods, making healthy choices, staying well hydrated and practicing mindfulness when eating., and increase protein intake, fibrous foods (25 grams per day for women, 30 grams for men) and water to improve satiety and decrease hunger signals. .  Additional resources provided today: ChatGPT instruction and meal plan for 1000 calories 80 grams protein and lower carb 7 day meal plan. Instructed on [email or web address] for recipe ideas and to fit to plan.   Recommended Physical Activity Goals  Shirlie has been advised to work up to 150 minutes of moderate intensity aerobic activity a week and strengthening exercises 2-3 times per week for cardiovascular health, weight loss maintenance and  preservation of muscle mass.   She has agreed to Think about enjoyable ways to increase daily physical activity and overcoming barriers to exercise, Increase physical activity in their day and reduce sedentary time (increase NEAT)., Start strengthening exercises with a goal of 2-3 sessions a week , and Combine aerobic and strengthening exercises for efficiency and improved cardiometabolic health.   Pharmacotherapy We discussed various medication options to help Jasmain with her weight loss efforts and we both agreed to initiate Metformin  ER 500 mg 1 tab daily for insulin  resistance/prediabetes and off label for weight loss.  ASSOCIATED CONDITIONS ADDRESSED TODAY  Action/Plan  Prediabetes A1c 6.0 02/2022 Insulin  resistance Counseled on pathophysiology of insulin  resistance, discussed use of metformin  and pt has agreed to initiate Metfromin ER 500 mg every day with largest meal Continue  to follow category 1 meal plan and reduce simple carbohydrates -     metFORMIN  HCl ER; Take 1 tablet (500 mg total) by mouth daily with breakfast.  Dispense: 30 tablet; Refill: 0   Hypertriglyceridemia      Counseled that loss of 10-15% body weight can help reduce triglycerides      Continue to follow category 1 meal plan and reduce simple carbohydrates and saturated fats       Start incorporating more activity with initial goal of 150 minutes/week  Vitamin D  deficiency Start ergocalciferol  50000 units once a week for 12 weeks and will recheck lab -     Vitamin D  (Ergocalciferol ); Take 1 capsule (50,000 Units total) by mouth every 7 (seven) days.  Dispense: 5 capsule; Refill: 1   Class 1 obesity with serious comorbidity and body mass index (BMI) of 30.0 to 30.9 in adult, unspecified obesity type   Continue Category 1 meal plan and focus on getting 80 grams of protein daily and 64 ounces of water  See plan above        Return in about 3 weeks (around 11/24/2023).SABRA She was informed of the importance of  frequent follow up visits to maximize her success with intensive lifestyle modifications for her multiple health conditions.   ATTESTASTION STATEMENTS:  Reviewed by clinician on day of visit: allergies, medications, problem list, medical history, surgical history, family history, social history, and previous encounter notes.   I personally spent a total of 50 minutes in the care of the patient today including preparing to see the patient, getting/reviewing separately obtained history, performing a medically appropriate exam/evaluation, counseling and educating, placing orders, referring and communicating with other health care professionals, documenting clinical information in the EHR, independently interpreting results, communicating results, and coordinating care.   Tremell Reimers ANP-C

## 2023-11-17 NOTE — Addendum Note (Signed)
 Addended by: DELORES CASTOR A on: 11/17/2023 01:48 PM   Modules accepted: Orders

## 2023-11-23 ENCOUNTER — Other Ambulatory Visit (INDEPENDENT_AMBULATORY_CARE_PROVIDER_SITE_OTHER): Payer: Self-pay | Admitting: Nurse Practitioner

## 2023-11-23 DIAGNOSIS — E559 Vitamin D deficiency, unspecified: Secondary | ICD-10-CM

## 2023-11-25 ENCOUNTER — Other Ambulatory Visit (INDEPENDENT_AMBULATORY_CARE_PROVIDER_SITE_OTHER): Payer: Self-pay | Admitting: Nurse Practitioner

## 2023-11-25 DIAGNOSIS — E88819 Insulin resistance, unspecified: Secondary | ICD-10-CM

## 2023-11-29 ENCOUNTER — Encounter (INDEPENDENT_AMBULATORY_CARE_PROVIDER_SITE_OTHER): Payer: Self-pay | Admitting: Nurse Practitioner

## 2023-11-29 ENCOUNTER — Ambulatory Visit (INDEPENDENT_AMBULATORY_CARE_PROVIDER_SITE_OTHER): Admitting: Nurse Practitioner

## 2023-11-29 VITALS — BP 106/72 | HR 80 | Temp 98.5°F | Ht 65.0 in | Wt 184.0 lb

## 2023-11-29 DIAGNOSIS — E88819 Insulin resistance, unspecified: Secondary | ICD-10-CM

## 2023-11-29 DIAGNOSIS — Z6830 Body mass index (BMI) 30.0-30.9, adult: Secondary | ICD-10-CM

## 2023-11-29 DIAGNOSIS — E559 Vitamin D deficiency, unspecified: Secondary | ICD-10-CM

## 2023-11-29 DIAGNOSIS — E66811 Obesity, class 1: Secondary | ICD-10-CM

## 2023-11-29 MED ORDER — METFORMIN HCL ER 500 MG PO TB24: 500.0000 mg | ORAL_TABLET | Freq: Every day | ORAL | 1 refills | Status: DC

## 2023-11-29 NOTE — Progress Notes (Signed)
 Office: [phone]  /  Fax: [phone]  WEIGHT SUMMARY AND BIOMETRICS  Weight Lost Since Last Visit: 0  Weight Gained Since Last Visit: 2 lb   Vitals Temp: 98.5 F (36.9 C) BP: 106/72 Pulse Rate: 80 SpO2: 100 %   Anthropometric Measurements Height: 5' 5 (1.651 m) Weight: 184 lb (83.5 kg) BMI (Calculated): 30.62 Weight at Last Visit: 182 lb Weight Lost Since Last Visit: 0 Weight Gained Since Last Visit: 2 lb Starting Weight: 179 lb Total Weight Loss (lbs): 0 lb (0 kg) Peak Weight: 189 lb   Body Composition  Body Fat %: 36.6 % Fat Mass (lbs): 67.6 lbs Muscle Mass (lbs): 111 lbs Total Body Water (lbs): 77.2 lbs Visceral Fat Rating : 8   Other Clinical Data Fasting: no Labs: no Today's Visit #: 3 Starting Date: 10/20/23    Total Weight Loss:0  Bio Impedance Data reviewed with patient: Up 2.2 pounds muscle and no change in adipose  HPI  Chief Complaint: OBESITY  Marie Harper is here to discuss her progress with her obesity treatment plan. She is on the the Category 1 Plan and states she is following her eating plan approximately 50 % of the time. She states she is exercising 35-45 minutes 3 days per week.   Interval History:  Marie Harper was started on Metformin  ER 5000 mg every day for insulin  resistance and Ergocalciferol  50000 units once a week for Vit D deficiency at last office visit.  No side effects have been noted with either medication. She is having a lot of sweets cravings.  She has been having to eat out more as her daughter has cheer and they are eating on the go at night. She has been doing protein shakes for dinner or if skips a meal. She has still been occasionally skipping a meal.  Breakfast: boiled egg 1-2 slices of bacon, 1 eggo no syrup Lunch: [place] burger from [employer]: protein shake or eats out She is drinking 40 ounces of water daily, occasional sweet drink    PHYSICAL EXAM:  Blood pressure 106/72, pulse 80, temperature 98.5  F (36.9 C), height 5' 5 (1.651 m), weight 184 lb (83.5 kg), last menstrual period 10/20/2023, SpO2 100%. Body mass index is 30.62 kg/m.  General: Well Developed, well nourished, and in no acute distress.  HEENT: Normocephalic, atraumatic; EOMI, sclerae are anicteric. Skin: Warm and dry, good turgor Chest:  Normal excursion, shape, no gross ABN Respiratory: No conversational dyspnea; speaking in full sentences NeuroM-Sk:  Normal gross ROM * 4 extremities  Psych: A and O X 3, insight adequate, mood- full    DIAGNOSTIC DATA REVIEWED:  BMET    Component Value Date/Time   NA 139 10/20/2023 0934   K 4.2 10/20/2023 0934   CL 103 10/20/2023 0934   CO2 19 (L) 10/20/2023 0934   GLUCOSE 79 10/20/2023 0934   GLUCOSE 125 (H) 09/08/2016 0333   BUN 12 10/20/2023 0934   CREATININE 0.76 10/20/2023 0934   CALCIUM 9.2 10/20/2023 0934   GFRNONAA >60 09/08/2016 0333   GFRAA >60 09/08/2016 0333   Lab Results  Component Value Date   HGBA1C 5.4 09/26/2023   HGBA1C 5.5 02/18/2011   Lab Results  Component Value Date   INSULIN  11.5 10/20/2023   INSULIN  31.5 (H) 02/25/2022   Lab Results  Component Value Date   TSH 1.280 10/20/2023   CBC    Component Value Date/Time   WBC 6.9 09/26/2023 1644   WBC 11.7 (H) 11/22/2017 1104  RBC 4.33 09/26/2023 1644   RBC 4.57 11/22/2017 1104   HGB 12.1 09/26/2023 1644   HCT 37.7 09/26/2023 1644   PLT 232 09/26/2023 1644   MCV 87 09/26/2023 1644   MCH 27.9 09/26/2023 1644   MCH 27.3 09/08/2016 0333   MCHC 32.1 09/26/2023 1644   MCHC 33.5 11/22/2017 1104   RDW 14.3 09/26/2023 1644   Iron Studies    Component Value Date/Time   IRON 63 02/18/2011 1149   FERRITIN 77.0 02/18/2011 1149   IRONPCTSAT 20.4 02/18/2011 1149   Lipid Panel     Component Value Date/Time   CHOL 148 09/26/2023 1644   TRIG 198 (H) 09/26/2023 1644   HDL 42 09/26/2023 1644   CHOLHDL 3.5 09/26/2023 1644   LDLCALC 73 09/26/2023 1644   Hepatic Function Panel      Component Value Date/Time   PROT 7.2 09/26/2023 1644   ALBUMIN 4.5 09/26/2023 1644   AST 24 09/26/2023 1644   ALT 23 09/26/2023 1644   ALKPHOS 39 (L) 09/26/2023 1644   BILITOT 0.3 09/26/2023 1644      Component Value Date/Time   TSH 1.280 10/20/2023 0934   Nutritional Lab Results  Component Value Date   VD25OH 35.1 10/20/2023     ASSESSMENT AND PLAN   Class 1 obesity with serious comorbidity and body mass index (BMI) of 30.0 to 30.9 in adult, unspecified obesity type TREATMENT PLAN FOR OBESITY:  Recommended Dietary Goals  Marie Harper is currently in the action stage of change. As such, her goal is to continue weight management plan. She has agreed to the Category 1 Plan.  Behavioral Intervention  We discussed the following Behavioral Modification Strategies today: avoiding skipping meals, increasing water intake , work on tracking and journaling calories using tracking application- at least 2-3 days, reading food labels , staying on track while traveling and vacationing, continue to work on maintaining a reduced calorie state, getting the recommended amount of protein, incorporating whole foods, making healthy choices, staying well hydrated and practicing mindfulness when eating., and increase protein intake, fibrous foods (25 grams per day for women, 30 grams for men) and water to improve satiety and decrease hunger signals. .  Additional resources provided today: Eating out handout- counseled on healthier choices when eating out  Recommended Physical Activity Goals  Marie Harper has been advised to work up to 150 minutes of moderate intensity aerobic activity a week and strengthening exercises 2-3 times per week for cardiovascular health, weight loss maintenance and preservation of muscle mass.   She has agreed to Think about enjoyable ways to increase daily physical activity and overcoming barriers to exercise, Increase physical activity in their day and reduce sedentary time  (increase NEAT)., and Start aerobic activity with a goal of 150 minutes a week at moderate intensity.    Pharmacotherapy We discussed various medication options to help Marie Harper with her weight loss efforts and we both agreed to continue Metformin  XR 500 mg 1 tab po every day for insulin  resistance and ergocalciferol  50000 units once a week for Vit D deficiency.  ASSOCIATED CONDITIONS ADDRESSED TODAY  Action/Plan  Vitamin D  deficiency Continue to supplement with Ergocalciferol  50000 units once a week- no side effects are noted  Insulin  resistance Continue Category 1  meal plan, limit simple carbohydrates Decreasing body weight by 10-15% can improve glucose levels Increase exercise to goal of 150 minutes of moderate to high intensity exercise/week.  Continue Metformin  500 mg every day- denies side effects -  metFORMIN  HCl ER; Take 1 tablet (500 mg total) by mouth daily with breakfast.  Dispense: 30 tablet; Refill: 1          Return in about 4 weeks (around 12/27/2023).SABRA She was informed of the importance of frequent follow up visits to maximize her success with intensive lifestyle modifications for her multiple health conditions.   ATTESTASTION STATEMENTS:  Reviewed by clinician on day of visit: allergies, medications, problem list, medical history, surgical history, family history, social history, and previous encounter notes.     Bradie Lacock ANP-C

## 2023-12-01 ENCOUNTER — Encounter: Payer: Self-pay | Admitting: Dermatology

## 2023-12-01 ENCOUNTER — Ambulatory Visit: Admitting: Dermatology

## 2023-12-01 VITALS — BP 111/80 | HR 80

## 2023-12-01 DIAGNOSIS — L918 Other hypertrophic disorders of the skin: Secondary | ICD-10-CM | POA: Diagnosis not present

## 2023-12-01 DIAGNOSIS — L700 Acne vulgaris: Secondary | ICD-10-CM | POA: Diagnosis not present

## 2023-12-01 NOTE — Progress Notes (Signed)
   Follow-Up Visit   Subjective  Marie Harper is a 48 y.o. female who presents for the following: eczema and acne  Patient present today for follow up visit for eczema and acne. Patient was last evaluated on 08/03/23. At this visit patient was prescribed clindamycin  swabs in the morning and Altreno  0.05% (tretinoin  lotion), 3 nights a week and mometasone  twice a day for up to 2 weeks for eczema.  Patient reports sxs are better but still having a a few acne breakouts about a week and a half ago and last eczema flare was a month ago on her neck. Patient denies medication changes. Patient states she is over all happy with the results. Patient has noticed her skin has been oily.   The following portions of the chart were reviewed this encounter and updated as appropriate: medications, allergies, medical history  Review of Systems:  No other skin or systemic complaints except as noted in HPI or Assessment and Plan.  Objective  Well appearing patient in no apparent distress; mood and affect are within normal limits.   A focused examination was performed of the following areas: face and neck  Relevant exam findings are noted in the Assessment and Plan.            Assessment & Plan    Acne vulgaris Acne vulgaris with oily skin and occasional pustules, primarily on the face. Currently managed with topical treatments including clindamycin  and Altreno  (tretinoin ). Reports improvement in dryness and flakiness, but experiences increased oiliness throughout the day.  - Add salicylic acid face wash in the morning to control oiliness. - Continue clindamycin  application in the morning. - Continue Altreno  (tretinoin ) three nights a week. - Use La Roche-Posay mattifying lotion after clindamycin  to reduce greasiness. - Use Cicalfate at night after Altreno  to calm irritation and dryness. - Provide samples of La Roche-Posay. - Refill Altreno  prescription.  Irritated  skin tags Presence of  axillary skin tags, with one larger and one smaller tag. Expresses a desire for removal due to irritation. - Perform cryotherapy to remove axillary skin tags.    INFLAMED SKIN TAG Left Axilla Destruction of lesion - Left Axilla Complexity: simple   Destruction method: cryotherapy   Informed consent: discussed and consent obtained   Timeout:  patient name, date of birth, surgical site, and procedure verified Lesion destroyed using liquid nitrogen: Yes   Region frozen until ice ball extended beyond lesion: Yes   Outcome: patient tolerated procedure well with no complications   Post-procedure details: wound care instructions given     Return in about 5 months (around 04/30/2024) for Acne follow up .  I, Doyce Pan, CMA, am acting as scribe for [employer], DO.   Documentation: I have reviewed the above documentation for accuracy and completeness, and I agree with the above.  Delon Lenis, DO

## 2023-12-01 NOTE — Patient Instructions (Addendum)
 VISIT SUMMARY:  During today's visit, we discussed your ongoing acne treatment and addressed your concerns about skin tags. You have been using clindamycin  and Altreno  (tretinoin ) for your acne, and we reviewed your current skincare routine. We also talked about the presence of skin tags under your arm and your interest in having them removed.  YOUR PLAN:  -ACNE VULGARIS:  Acne vulgaris is a common skin condition that causes pimples and oily skin.  To help manage your acne, we will add a salicylic acid face wash in the morning to control oiliness.  Continue using clindamycin  in the morning and Altreno  (tretinoin ) three nights a week.  Apply La Roche-Posay mattifying lotion after clindamycin  to reduce greasiness and use Cicalfate at night after Altreno  to calm irritation and dryness. We will provide samples of La Roche-Posay products and refill your Altreno  prescription.  -SKIN TAGS:  Skin tags are small, benign growths that can appear in areas where the skin folds, such as under the arms.  Since you expressed a desire to have them removed for cosmetic reasons, we will perform cryotherapy to remove the skin tags.  INSTRUCTIONS:  Please follow up with us  if you have any concerns or if your symptoms do not improve. Continue with the prescribed treatments and let us  know if you need any additional assistance.    For areas treated with Liquid Nitrogen:  Keep clean with soap and water.  Apply Vaseline or Aquaphor twice daily.  Important Information  Due to recent changes in healthcare laws, you may see results of your pathology and/or laboratory studies on MyChart before the doctors have had a chance to review them. We understand that in some cases there may be results that are confusing or concerning to you. Please understand that not all results are received at the same time and often the doctors may need to interpret multiple results in order to provide you with the best plan of care or  course of treatment. Therefore, we ask that you please give us  2 business days to thoroughly review all your results before contacting the office for clarification. Should we see a critical lab result, you will be contacted sooner.   If You Need Anything After Your Visit  If you have any questions or concerns for your doctor, please call our main line at [phone] If no one answers, please leave a voicemail as directed and we will return your call as soon as possible. Messages left after 4 pm will be answered the following business day.   You may also send us  a message via MyChart. We typically respond to MyChart messages within 1-2 business days.  For prescription refills, please ask your pharmacy to contact our office. Our fax number is [phone].  If you have an urgent issue when the clinic is closed that cannot wait until the next business day, you can page your doctor at the number below.    Please note that while we do our best to be available for urgent issues outside of office hours, we are not available 24/7.   If you have an urgent issue and are unable to reach us , you may choose to seek medical care at your doctor's office, retail clinic, urgent care center, or emergency room.  If you have a medical emergency, please immediately call 911 or go to the emergency department. In the event of inclement weather, please call our main line at [phone] for an update on the status of any delays or closures.  Dermatology Medication Tips: Please keep the boxes that topical medications come in in order to help keep track of the instructions about where and how to use these. Pharmacies typically print the medication instructions only on the boxes and not directly on the medication tubes.   If your medication is too expensive, please contact our office at [phone] or send us  a message through MyChart.   We are unable to tell what your co-pay for medications will be in advance as  this is different depending on your insurance coverage. However, we may be able to find a substitute medication at lower cost or fill out paperwork to get insurance to cover a needed medication.   If a prior authorization is required to get your medication covered by your insurance company, please allow us  1-2 business days to complete this process.  Drug prices often vary depending on where the prescription is filled and some pharmacies may offer cheaper prices.  The website www.goodrx.com contains coupons for medications through different pharmacies. The prices here do not account for what the cost may be with help from insurance (it may be cheaper with your insurance), but the website can give you the price if you did not use any insurance.  - You can print the associated coupon and take it with your prescription to the pharmacy.  - You may also stop by our office during regular business hours and pick up a GoodRx coupon card.  - If you need your prescription sent electronically to a different pharmacy, notify our office through [facility] or by phone at [phone]

## 2023-12-16 ENCOUNTER — Encounter: Payer: Self-pay | Admitting: Dermatology

## 2023-12-21 ENCOUNTER — Other Ambulatory Visit (INDEPENDENT_AMBULATORY_CARE_PROVIDER_SITE_OTHER): Payer: Self-pay | Admitting: Nurse Practitioner

## 2023-12-21 DIAGNOSIS — E559 Vitamin D deficiency, unspecified: Secondary | ICD-10-CM

## 2023-12-28 ENCOUNTER — Ambulatory Visit (INDEPENDENT_AMBULATORY_CARE_PROVIDER_SITE_OTHER): Payer: Self-pay | Admitting: Nurse Practitioner

## 2023-12-28 ENCOUNTER — Encounter (INDEPENDENT_AMBULATORY_CARE_PROVIDER_SITE_OTHER): Payer: Self-pay | Admitting: Nurse Practitioner

## 2023-12-28 VITALS — BP 117/75 | HR 74 | Temp 98.4°F | Ht 65.0 in | Wt 187.0 lb

## 2023-12-28 DIAGNOSIS — R7303 Prediabetes: Secondary | ICD-10-CM

## 2023-12-28 DIAGNOSIS — E66811 Obesity, class 1: Secondary | ICD-10-CM

## 2023-12-28 DIAGNOSIS — E559 Vitamin D deficiency, unspecified: Secondary | ICD-10-CM | POA: Diagnosis not present

## 2023-12-28 DIAGNOSIS — E88819 Insulin resistance, unspecified: Secondary | ICD-10-CM

## 2023-12-28 DIAGNOSIS — Z6831 Body mass index (BMI) 31.0-31.9, adult: Secondary | ICD-10-CM | POA: Diagnosis not present

## 2023-12-28 MED ORDER — METFORMIN HCL ER 500 MG PO TB24: 500.0000 mg | ORAL_TABLET | Freq: Two times a day (BID) | ORAL | 0 refills | Status: DC

## 2023-12-28 MED ORDER — VITAMIN D (ERGOCALCIFEROL) 1.25 MG (50000 UNIT) PO CAPS: 50000.0000 [IU] | ORAL_CAPSULE | ORAL | 1 refills | Status: DC

## 2023-12-28 NOTE — Progress Notes (Signed)
 Office: [phone]  /  Fax: [phone]  WEIGHT SUMMARY AND BIOMETRICS  Weight Lost Since Last Visit: 0  Weight Gained Since Last Visit: 5 lb   Vitals Temp: 98.4 F (36.9 C) BP: 117/75 Pulse Rate: 74 SpO2: 97 %   Anthropometric Measurements Height: 5' 5 (1.651 m) Weight: 187 lb (84.8 kg) BMI (Calculated): 31.12 Weight at Last Visit: 184 lb Weight Lost Since Last Visit: 0 Weight Gained Since Last Visit: 5 lb Starting Weight: 179 lb Total Weight Loss (lbs): 0 lb (0 kg) Peak Weight: 189 lb   Body Composition  Body Fat %: 38 % Fat Mass (lbs): 71 lbs Muscle Mass (lbs): 110.2 lbs Total Body Water (lbs): 79.2 lbs Visceral Fat Rating : 8   Other Clinical Data Fasting: no Labs: no Today's Visit #: 4 Starting Date: 10/20/23    Total Weight Loss:0 pounds  Bio Impedance Data reviewed with patient: Muscle is down 0.8 pounds, adipose is up 3.4 pounds, PBF increased from 36.6% to 38%  HPI  Chief Complaint: OBESITY  Marie Harper is here to discuss her progress with her obesity treatment plan. She is on the the Category 2 Plan and states she is following her eating plan approximately 50 % of the time. She states she is exercising 30-40 minutes 3 days per week.   Interval History:  Since last office visit she has not  been getting 100 grams of protein daily.  Most days gets about 54 ounces of water. Her periods have been coming every 2 weeks.  Breakfast: 1 boiled egg and 1-2 slices of bacon. Coffee with creamer(60 calories)- 12 grams protein Lunch:Ham and cheese sandwich, not 4 ounces o(10 grams protein) Dinner- pork rib, 1/2 cup potato salad, gingerale zero (20 g protein) Snack: chicken potpie(11 g protein)  She is walking 30-40 minutes 3 days a week. Job has changed and under more stress.   PHYSICAL EXAM:  Blood pressure 117/75, pulse 74, temperature 98.4 F (36.9 C), height 5' 5 (1.651 m), weight 187 lb (84.8 kg), last menstrual period 11/07/2023, SpO2  97%. Body mass index is 31.12 kg/m.  General: Well Developed, well nourished, and in no acute distress.  HEENT: Normocephalic, atraumatic; EOMI, sclerae are anicteric. Skin: Warm and dry, good turgor Chest:  Normal excursion, shape, no gross ABN Respiratory: No conversational dyspnea; speaking in full sentences NeuroM-Sk:  Normal gross ROM * 4 extremities  Psych: A and O X 3, insight adequate, mood- full    DIAGNOSTIC DATA REVIEWED:  BMET    Component Value Date/Time   NA 139 10/20/2023 0934   K 4.2 10/20/2023 0934   CL 103 10/20/2023 0934   CO2 19 (L) 10/20/2023 0934   GLUCOSE 79 10/20/2023 0934   GLUCOSE 125 (H) 09/08/2016 0333   BUN 12 10/20/2023 0934   CREATININE 0.76 10/20/2023 0934   CALCIUM 9.2 10/20/2023 0934   GFRNONAA >60 09/08/2016 0333   GFRAA >60 09/08/2016 0333   Lab Results  Component Value Date   HGBA1C 5.4 09/26/2023   HGBA1C 5.5 02/18/2011   Lab Results  Component Value Date   INSULIN  11.5 10/20/2023   INSULIN  31.5 (H) 02/25/2022   Lab Results  Component Value Date   TSH 1.280 10/20/2023   CBC    Component Value Date/Time   WBC 6.9 09/26/2023 1644   WBC 11.7 (H) 11/22/2017 1104   RBC 4.33 09/26/2023 1644   RBC 4.57 11/22/2017 1104   HGB 12.1 09/26/2023 1644   HCT 37.7 09/26/2023 1644  PLT 232 09/26/2023 1644   MCV 87 09/26/2023 1644   MCH 27.9 09/26/2023 1644   MCH 27.3 09/08/2016 0333   MCHC 32.1 09/26/2023 1644   MCHC 33.5 11/22/2017 1104   RDW 14.3 09/26/2023 1644   Iron Studies    Component Value Date/Time   IRON 63 02/18/2011 1149   FERRITIN 77.0 02/18/2011 1149   IRONPCTSAT 20.4 02/18/2011 1149   Lipid Panel     Component Value Date/Time   CHOL 148 09/26/2023 1644   TRIG 198 (H) 09/26/2023 1644   HDL 42 09/26/2023 1644   CHOLHDL 3.5 09/26/2023 1644   LDLCALC 73 09/26/2023 1644   Hepatic Function Panel     Component Value Date/Time   PROT 7.2 09/26/2023 1644   ALBUMIN 4.5 09/26/2023 1644   AST 24 09/26/2023 1644    ALT 23 09/26/2023 1644   ALKPHOS 39 (L) 09/26/2023 1644   BILITOT 0.3 09/26/2023 1644      Component Value Date/Time   TSH 1.280 10/20/2023 0934   Nutritional Lab Results  Component Value Date   VD25OH 35.1 10/20/2023     ASSESSMENT AND PLAN  Class 1 obesity with serious comorbidity and body mass index (BMI) of 31.0 to 31.9 in adult, unspecified obesity type TREATMENT PLAN FOR OBESITY:  Recommended Dietary Goals  Kaydee is currently in the action stage of change. As such, her goal is to continue weight management plan. She has agreed to the Category 2 Plan.  Behavioral Intervention  We discussed the following Behavioral Modification Strategies today: increasing lean protein intake to established goals, increasing water intake , work on meal planning and preparation, work on tracking and journaling calories using tracking application, continue to work on maintaining a reduced calorie state, getting the recommended amount of protein, incorporating whole foods, making healthy choices, staying well hydrated and practicing mindfulness when eating., and increase protein intake, fibrous foods (25 grams per day for women, 30 grams for men) and water to improve satiety and decrease hunger signals. .    Recommended Physical Activity Goals  Promyse has been advised to work up to 150 minutes of moderate intensity aerobic activity a week and strengthening exercises 2-3 times per week for cardiovascular health, weight loss maintenance and preservation of muscle mass.   She has agreed to Think about enjoyable ways to increase daily physical activity and overcoming barriers to exercise, Increase physical activity in their day and reduce sedentary time (increase NEAT)., and Start aerobic activity with a goal of 150 minutes a week at moderate intensity.    Pharmacotherapy We discussed various medication options to help Josephina with her weight loss efforts and we both agreed to increase  Metformin  XR 500 mg to BID for insulin  resistance and continue ergocalciferol  50000 units once a week for Vitamin D  deficiency.  ASSOCIATED CONDITIONS ADDRESSED TODAY  Action/Plan  Prediabetes A1c 6.0 02/2022 Insulin  resistance Continue to work on implementing Category 2  meal plan, limit simple carbohydrates, strong emphasis on getting 100 grams of protein daily Start Metformin  XR 500 mg 1 tab daily Continue exercise with current goal of 150 minutes of moderate to high intensity exercise/week.  -     metFORMIN  HCl ER; Take 1 tablet (500 mg total) by mouth 2 (two) times daily with a meal.  Dispense: 60 tablet; Refill: 0  Vitamin D  deficiency Continue to supplement with Ergocalciferol  50000 units once a week Low vitamin D  levels can be associated with adiposity and may result in leptin resistance and weight gain.  Also associated with fatigue.  Currently on vitamin D  supplementation without any adverse effects such as nausea, vomiting or muscle weakness.    -     Vitamin D  (Ergocalciferol ); Take 1 capsule (50,000 Units total) by mouth every 7 (seven) days.  Dispense: 5 capsule; Refill: 1         Return in about 4 weeks (around 01/25/2024).SABRA She was informed of the importance of frequent follow up visits to maximize her success with intensive lifestyle modifications for her multiple health conditions.   ATTESTASTION STATEMENTS:  Reviewed by clinician on day of visit: allergies, medications, problem list, medical history, surgical history, family history, social history, and previous encounter notes.     Kamara Allan ANP-C

## 2024-01-24 ENCOUNTER — Other Ambulatory Visit (INDEPENDENT_AMBULATORY_CARE_PROVIDER_SITE_OTHER): Payer: Self-pay | Admitting: Nurse Practitioner

## 2024-01-24 DIAGNOSIS — E88819 Insulin resistance, unspecified: Secondary | ICD-10-CM

## 2024-01-25 ENCOUNTER — Ambulatory Visit (INDEPENDENT_AMBULATORY_CARE_PROVIDER_SITE_OTHER): Admitting: Nurse Practitioner

## 2024-01-25 ENCOUNTER — Encounter (INDEPENDENT_AMBULATORY_CARE_PROVIDER_SITE_OTHER): Payer: Self-pay | Admitting: Nurse Practitioner

## 2024-01-25 VITALS — BP 119/74 | HR 85 | Temp 98.7°F | Ht 65.0 in | Wt 189.0 lb

## 2024-01-25 DIAGNOSIS — Z6831 Body mass index (BMI) 31.0-31.9, adult: Secondary | ICD-10-CM | POA: Diagnosis not present

## 2024-01-25 DIAGNOSIS — E66811 Obesity, class 1: Secondary | ICD-10-CM | POA: Diagnosis not present

## 2024-01-25 DIAGNOSIS — E559 Vitamin D deficiency, unspecified: Secondary | ICD-10-CM | POA: Diagnosis not present

## 2024-01-25 DIAGNOSIS — R7303 Prediabetes: Secondary | ICD-10-CM | POA: Diagnosis not present

## 2024-01-25 DIAGNOSIS — E88819 Insulin resistance, unspecified: Secondary | ICD-10-CM

## 2024-01-25 MED ORDER — METFORMIN HCL ER 500 MG PO TB24: 500.0000 mg | ORAL_TABLET | Freq: Two times a day (BID) | ORAL | 1 refills | Status: DC

## 2024-01-25 NOTE — Progress Notes (Signed)
 Office: [phone]  /  Fax: [phone]  WEIGHT SUMMARY AND BIOMETRICS  Weight Lost Since Last Visit: 0  Weight Gained Since Last Visit: 2 lb   Vitals Temp: 98.7 F (37.1 C) BP: 119/74 Pulse Rate: 85 SpO2: 100 %   Anthropometric Measurements Height: 5' 5 (1.651 m) Weight: 189 lb (85.7 kg) BMI (Calculated): 31.45 Weight at Last Visit: 187  lb Weight Lost Since Last Visit: 0 Weight Gained Since Last Visit: 2 lb Starting Weight: 179 lb Total Weight Loss (lbs): 0 lb (0 kg) Peak Weight: 189 lb   Body Composition  Body Fat %: 38.7 % Fat Mass (lbs): 73.2 lbs Muscle Mass (lbs): 110 lbs Total Body Water (lbs): 78 lbs Visceral Fat Rating : 9   Other Clinical Data Fasting: no Labs: no Today's Visit #: 5 Starting Date: 10/20/23    Total Weight Loss: 0 pounds  Bio Impedance Data reviewed with patient: Muscle is down 0.2 pounds, adipose is up 2.2 pounds. Visceral fat rating increased by 1 point from 8 to 9.   HPI  Chief Complaint: OBESITY  Marie Harper is here to discuss her progress with her obesity treatment plan. She is on the the Category 2 Plan and states she is following her eating plan approximately 45 % of the time. She states she is exercising 40 minutes 2 days per week.   Interval History:  Since last office visit she ws sick over Thanksgiving and has lasted about 3 weeks with congestion and cough. She had to take antibiotic and prednisone .  She is currently feeling better. She did less exercise due to illness. She continues to do skipping of meals.  She works in a [employer] and they do bring in food. Given food celebration strategies.  She is doing quest protein shakes with 45 grams of protein She drinks about 40 ounces of water  She continues to take Metformin  XR 500 mg BID for prediabetes and insulin  resistance- denies side effects from medication.  She also continues to take Ergocalciferol  50000 units once a week for Vit D deficiency and denies side  effects.    PHYSICAL EXAM:  Blood pressure 119/74, pulse 85, temperature 98.7 F (37.1 C), height 5' 5 (1.651 m), weight 189 lb (85.7 kg), SpO2 100%. Body mass index is 31.45 kg/m.  General: Well Developed, well nourished, and in no acute distress.  HEENT: Normocephalic, atraumatic; EOMI, sclerae are anicteric. Skin: Warm and dry, good turgor Chest:  Normal excursion, shape, no gross ABN Respiratory: No conversational dyspnea; speaking in full sentences NeuroM-Sk:  Normal gross ROM * 4 extremities  Psych: A and O X 3, insight adequate, mood- full    DIAGNOSTIC DATA REVIEWED:  BMET    Component Value Date/Time   NA 139 10/20/2023 0934   K 4.2 10/20/2023 0934   CL 103 10/20/2023 0934   CO2 19 (L) 10/20/2023 0934   GLUCOSE 79 10/20/2023 0934   GLUCOSE 125 (H) 09/08/2016 0333   BUN 12 10/20/2023 0934   CREATININE 0.76 10/20/2023 0934   CALCIUM 9.2 10/20/2023 0934   GFRNONAA >60 09/08/2016 0333   GFRAA >60 09/08/2016 0333   Lab Results  Component Value Date   HGBA1C 5.4 09/26/2023   HGBA1C 5.5 02/18/2011   Lab Results  Component Value Date   INSULIN  11.5 10/20/2023   INSULIN  31.5 (H) 02/25/2022   Lab Results  Component Value Date   TSH 1.280 10/20/2023   CBC    Component Value Date/Time   WBC 6.9 09/26/2023 1644  WBC 11.7 (H) 11/22/2017 1104   RBC 4.33 09/26/2023 1644   RBC 4.57 11/22/2017 1104   HGB 12.1 09/26/2023 1644   HCT 37.7 09/26/2023 1644   PLT 232 09/26/2023 1644   MCV 87 09/26/2023 1644   MCH 27.9 09/26/2023 1644   MCH 27.3 09/08/2016 0333   MCHC 32.1 09/26/2023 1644   MCHC 33.5 11/22/2017 1104   RDW 14.3 09/26/2023 1644   Iron Studies    Component Value Date/Time   IRON 63 02/18/2011 1149   FERRITIN 77.0 02/18/2011 1149   IRONPCTSAT 20.4 02/18/2011 1149   Lipid Panel     Component Value Date/Time   CHOL 148 09/26/2023 1644   TRIG 198 (H) 09/26/2023 1644   HDL 42 09/26/2023 1644   CHOLHDL 3.5 09/26/2023 1644   LDLCALC 73  09/26/2023 1644   Hepatic Function Panel     Component Value Date/Time   PROT 7.2 09/26/2023 1644   ALBUMIN 4.5 09/26/2023 1644   AST 24 09/26/2023 1644   ALT 23 09/26/2023 1644   ALKPHOS 39 (L) 09/26/2023 1644   BILITOT 0.3 09/26/2023 1644      Component Value Date/Time   TSH 1.280 10/20/2023 0934   Nutritional Lab Results  Component Value Date   VD25OH 35.1 10/20/2023     ASSESSMENT AND PLAN  Class 1 obesity with serious comorbidity and body mass index (BMI) of 31.0 to 31.9 in adult, unspecified obesity type TREATMENT PLAN FOR OBESITY:  Recommended Dietary Goals  Marie Harper is currently in the action stage of change. As such, her goal is to continue weight management plan. She has agreed to the Category 2 Plan.  Behavioral Intervention  We discussed the following Behavioral Modification Strategies today: increasing lean protein intake to established goals, increasing vegetables, increasing fiber rich foods, avoiding skipping meals, increasing water intake , celebration eating strategies, and continue to work on maintaining a reduced calorie state, getting the recommended amount of protein, incorporating whole foods, making healthy choices, staying well hydrated and practicing mindfulness when eating.. Celebration eating strategies She plans to maintain her weight in December - She wants to have small indulgences, but she is not going to waste her calories on things that are not wonderful. - Outside of social situations she will continue to follow a structured eating plan but enjoy herself with portion control for holiday events, parties and get-togethers - She does recognize that this strategy is to help her maintain her weight, not lose weight and in January she will return to a structured plan for weight loss   Recommended Physical Activity Goals  Marie Harper has been advised to work up to 150 minutes of moderate intensity aerobic activity a week and strengthening exercises  2-3 times per week for cardiovascular health, weight loss maintenance and preservation of muscle mass.   She has agreed to Think about enjoyable ways to increase daily physical activity and overcoming barriers to exercise, Increase physical activity in their day and reduce sedentary time (increase NEAT)., and Increase aerobic activity with a goal of 150 minutes a week at moderate intensity.    Pharmacotherapy We discussed various medication options to help Rowyn with her weight loss efforts and we both agreed to continue Metformin  XR 500 mg BID for prediabetes and insulin  resistance- denies side effects with the medication.  Continue Ergocalciferol  50000 units once a week for Vit D deficiency- denies side effects to medication.  ASSOCIATED CONDITIONS ADDRESSED TODAY  Action/Plan  Prediabetes A1c 6.0 02/2022 Continue Category 2  meal  plan, limit simple carbohydrates, increase lean protein Continue Metformin  500 mg BID Continue exercise with current goal of 150 minutes of moderate to high intensity exercise/week.  Continue to follow with PCP regularly  Insulin  resistance Start Category 2  meal plan, limit simple carbohydrates, limit saturated fats and increase lean protein and water Continue Metformin  XR 500 mg BID- denies side effects Continue exercise with current goal of 150 minutes of moderate to high intensity exercise/week.  -     metFORMIN  HCl ER; Take 1 tablet (500 mg total) by mouth 2 (two) times daily with a meal.  Dispense: 60 tablet; Refill: 1  Vitamin D  deficiency      Continue to supplement with Ergocalciferol  50000 units once a week        Low vitamin D  levels can be associated with adiposity and may result in leptin resistance and weight gain. Also associated with fatigue.        Currently on vitamin D  supplementation without any adverse effects such as nausea, vomiting or muscle weakness.       Return in about 5 weeks (around 02/29/2024) for due to holidays.. She was  informed of the importance of frequent follow up visits to maximize her success with intensive lifestyle modifications for her multiple health conditions.   ATTESTASTION STATEMENTS:  Reviewed by clinician on day of visit: allergies, medications, problem list, medical history, surgical history, family history, social history, and previous encounter notes.     Khristian Phillippi ANP-C

## 2024-02-24 ENCOUNTER — Other Ambulatory Visit (INDEPENDENT_AMBULATORY_CARE_PROVIDER_SITE_OTHER): Payer: Self-pay | Admitting: Nurse Practitioner

## 2024-02-24 DIAGNOSIS — E559 Vitamin D deficiency, unspecified: Secondary | ICD-10-CM

## 2024-02-29 ENCOUNTER — Ambulatory Visit (INDEPENDENT_AMBULATORY_CARE_PROVIDER_SITE_OTHER): Admitting: Nurse Practitioner

## 2024-03-01 ENCOUNTER — Encounter: Payer: Self-pay | Admitting: Internal Medicine

## 2024-03-01 ENCOUNTER — Ambulatory Visit: Admitting: Internal Medicine

## 2024-03-01 VITALS — BP 118/68 | HR 90 | Temp 98.2°F | Ht 65.0 in | Wt 188.6 lb

## 2024-03-01 DIAGNOSIS — R058 Other specified cough: Secondary | ICD-10-CM

## 2024-03-01 DIAGNOSIS — J45991 Cough variant asthma: Secondary | ICD-10-CM

## 2024-03-01 NOTE — Progress Notes (Signed)
 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 no problems as child or young adult with tendency to bad head colds since early 2014 with Sinus xray neg 08/28/12 then abrupt onset  cough / wheeze /doe then Fall 2014 p developed sneezing/ head congestion  dx'd as sinusitis at [facility] [address]  rx abx then worse again in November 2014 progressively worse  Despite rx and referred to Pulmonary clinic 06/28/13 by Remi Fulp p admit     Admit date: 06/23/2013  Discharge date: 06/25/2013  Discharge Diagnoses:  CAP (community acquired pneumonia)  Anemia  Bronchospasm      04/20/2021  yearly f/u ov/Cayetano Mikita re: cough variant asthma   maint on symbicort  160 2bid  / pepcid  20mg  p bfast  Chief Complaint  Patient presents with   Follow-up   Dyspnea:  back to baseline  Cough: none  Sleeping: bed blocks  SABA use: none  02: none  Covid status:  vax x 2  Rec For flare of coughing for any reason > Try prilosec otc 20mg   Take 30-60 min before first meal of the day and Pepcid  ac (famotidine ) 20 mg one @  bedtime until better for at least a week  then back to pepcid  20 mg after bfast     04/25/2023  f/u ov/Nishaan Stanke re: cough variant asthma  maint on singulair /symbicort   160  prn three times per week  Chief Complaint  Patient presents with   Asthma  Dyspnea:  power walking / outdoor biking  Cough: more x one month  Sleeping: bed blocks /  most nocts s  resp cc  SABA use: twice a month symb/ rarely alb 02: none  Rec For cough wheeze, short of breath > air supra up to 2 puff every 4 hours as needed   URI thx giving  then 1 weeks prior to christmas sinus infection >pred/ zpak     03/01/2024  ACUTE ov/Timber Marshman re: cough variant asthma    maint on symbicort  160 and air supra lprn but not really understanding how/ when to use latter   Chief Complaint  Patient presents with   Cough    Cough, wheeze and SOB x 3 weeks.   Patient sx improved today  Dyspnea:  more sob than usualx 3 weeks p ? URI Cough: white thick larg amt mostly daytime assoc with overt hb on prn tums (not following instructions re use of otc prilosec, pepcid  at onset of cough for any reason Sleeping: bed blocks/ two pillows s  resp cc  SABA use: 2 per 24 h   No obvious patterns in day to day or daytime variability or assoc   purulent sputum or mucus plugs or hemoptysis or cp or chest tightness, subjective wheeze or overt sinus or hb symptoms.   Also denies any obvious fluctuation of symptoms with weather or environmental changes or other aggravating or alleviating factors except as outlined above   No unusual exposure hx or h/o childhood pna/ asthma or knowledge of premature birth.  Current Allergies, Complete Past Medical History, Past Surgical History, Family History, and Social History were reviewed in [employer] record.  ROS  The following are not active complaints unless bolded Hoarseness, sore throat, dysphagia, dental problems, itching, sneezing,  nasal congestion or discharge of excess mucus or purulent secretions, ear ache,   fever, chills, sweats,  unintended wt loss or wt gain, classically pleuritic or exertional cp,  orthopnea pnd or arm/hand swelling  or leg swelling, presyncope, palpitations, abdominal pain, anorexia, nausea, vomiting, diarrhea  or change in bowel habits or change in bladder habits, change in stools or change in urine, dysuria, hematuria,  rash, arthralgias, visual complaints, headache, numbness, weakness or ataxia or problems with walking or coordination,  change in mood or  memory.          Outpatient Medications Prior to Visit  Medication Sig Dispense Refill   albuterol  (VENTOLIN  HFA) 108 (90 Base) MCG/ACT inhaler INHALE 1-2 PUFFS INTO THE LUNGS EVERY 4 HOURS AS NEEDED FOR WHEEZING OR SHORTNESS OF BREATH. 8.5 each 1   Albuterol -Budesonide  (AIRSUPRA ) 90-80 MCG/ACT AERO Inhale 2 puffs into  the lungs every 4 (four) hours as needed. 10.7 g 11   ALTRENO  0.05 % LOTN Apply 1 Application topically at bedtime. For the first month apply 2 nights weekly, after 1 month apply 3 nights weekly 20 g 5   budesonide -formoterol  (SYMBICORT ) 160-4.5 MCG/ACT inhaler Inhale 2 puffs into the lungs 2 (two) times daily.     buPROPion  (WELLBUTRIN  XL) 300 MG 24 hr tablet Take 300 mg by mouth daily.     clindamycin  (CLEOCIN  T) 1 % SWAB Apply 1 Application topically daily. Swab face in the morning after washing 69 each 6   Doxepin HCl 6 MG TABS TAKE 1/2 TAB ORAL AT BEDTIME FOR INSOMNIA 45 tablet 1   fexofenadine (ALLEGRA) 180 MG tablet Take 1 tablet by mouth daily.     levonorgestrel (MIRENA, 52 MG,) 20 MCG/24HR IUD Mirena 20 mcg/24 hours (7 yrs) 52 mg intrauterine device  Take 1 device by intrauterine route.     meloxicam (MOBIC) 15 MG tablet TAKE 1 TABLET BY MOUTH EVERY DAY 30 tablet 1   metFORMIN  (GLUCOPHAGE -XR) 500 MG 24 hr tablet Take 1 tablet (500 mg total) by mouth 2 (two) times daily with a meal. 60 tablet 1   mometasone  (ELOCON ) 0.1 % ointment Apply topically daily. Apply 2 times daily for 2 weeks then STOP and take a break for 2 WEEKS 45 g 11   montelukast  (SINGULAIR ) 10 MG tablet Take 1 tablet (10 mg total) by mouth at bedtime. 90 tablet 1   Multiple Vitamin (MULTI VITAMIN PO) Multi Vitamin     sertraline  (ZOLOFT ) 25 MG tablet Take 1 tablet (25 mg total) by mouth daily. 90 tablet 1   SUMAtriptan  (IMITREX ) 50 MG tablet TAKE 1 TABLET BY MOUTH ONCE AS NEEDED FOR MIGRAINE. MAY REPEAT IN 2 HOURS IF HEADACHE PERSISTS OR RECURS. 27 tablet 3   tiZANidine  (ZANAFLEX ) 4 MG tablet Take 1 tablet (4 mg total) by mouth 2 (two) times daily as needed for muscle spasms. 30 tablet 0   traMADol  (ULTRAM ) 50 MG tablet Take 1 tablet (50 mg total) by mouth every 6 (six) hours as needed. 20 tablet 0   Vitamin D , Ergocalciferol , (DRISDOL ) 1.25 MG (50000 UNIT) CAPS capsule Take 1 capsule (50,000 Units total) by mouth every 7  (seven) days. 5 capsule 1   linaclotide (LINZESS) 145 MCG CAPS capsule 1 cap(s) orally once a day for 90     No facility-administered medications prior to visit.            Past Medical History:  Diagnosis Date   Allergy     seasonal   Anemia    Anxiety    Arthritis    Asthma    Bilateral swelling of feet  Chronic kidney disease    Depression 2020   GERD (gastroesophageal reflux disease)    Headache(784.0)    has not had migraine in long time   Heartburn    Hemorrhage of anus and rectum 04/28/2022   OSA (obstructive sleep apnea)    Pre-diabetes    Rectal bleeding 04/28/2022   Ulcer    Vitamin B 12 deficiency    Vitamin B12 deficiency    Vitamin D  deficiency        Objective:   Physical Exam  Wt  03/01/2024     188  04/25/2023      178  04/20/2021   199  04/11/2020   190  10/09/2019    190  04/10/2019    187  07/11/2013    172> 08/22/2013 176 > 09/11/2013  177 > 10/09/2013  173 > 01/07/2014 173 > 06/06/2014  174 > 12/03/2014   180 >  12/02/2016     180 > 05/30/2017  183  > 11/22/2017 175 > 02/28/2018   182  > 04/11/2018   181 > 04/27/2018  181     06/27/13 171 lb 12.8 oz (77.928 kg)  06/23/13 170 lb 13.7 oz (77.5 kg)  11/13/11 197 lb (89.359 kg)     Vital signs reviewed  03/01/2024  - Note at rest 02 sats  100% on RA   General appearance:    amb bf nad better p air supra w/in one hour of ov    HEENT : Oropharynx  clear         NECK :  without  apparent JVD/ palpable Nodes/TM    LUNGS: no acc muscle use,  Nl contour chest which is clear to A and P bilaterally without cough on insp or exp maneuvers   CV:  RRR  no s3 or murmur or increase in P2, and no edema   ABD:  soft and nontender   MS:  Gait nl   ext warm without deformities Or obvious joint restrictions  calf tenderness, cyanosis or clubbing    SKIN: warm and dry without lesions    NEURO:  alert, approp, nl sensorium with  no motor or cerebellar deficits apparent.          Assessment & Plan:      Assessment & Plan Cough variant asthma Onset ? 2014 assoc with rhinitis  - started on dulera 100 2bid 06/27/13> better 07/11/2013  - 07/24/13 Sinus CT > Slight mucosal thickening in the right maxillary sinus. Remainder of the paranasal sinuses are clear. No air-fluid levels. No acute bony abnormality. Orbital soft tissues are unremarkable. - spirometry  08/22/2013 min airflow obst reflected in fef 25-75 despite prominent wheeze on exam - 09/11/13 increased dulera to 200 2bid due to concern not using the dulera frequently enough -  12/02/2016  extensive coaching HFA effectiveness =75% from  A baseline of 50%     -  Spirometry 12/02/2016  FEV1 2.50 (93%)  Ratio 74 off all rx, minimal curvature   - 11/22/2017   continue symb 80 - Allergy  profile 11/22/17 >  Eos 0.4 /  IgE  29  RAST neg  - FENO 04/11/2018  =   69 p symb 80 2bid prior to study during flare of asthma  - 04/11/2018    increased symb to 160 2bid and added singulair  > much better 04/27/2018  - 04/27/2018   continue symb 160 2bid/ singulair   - 03/01/2024  After extensive coaching inhaler device,  effectiveness =  90% hfa   Since prev ov with me had air supra prn added to maint rx with montelukast  but only uses once a day.  Explained need for supplmental bronchodilaterol/budesonide  for flares that will will continue to occur and may require prednisone  orally if not more aggressive with ICS and that's the purpose of Airsupra   >>>> Air Supra Rx is up to 12 puffs per day until symptoms are controlled then taper off completely.   Upper airway cough syndrome - Max acid suppression started 06/27/13 > modified to h2 bid 10/09/2013  - sinus CT neg 07/24/13  - trial of chlortrimeton started 09/11/2013 >> - Allergy  profile 11/22/17 >  Eos 0.4 /  IgE  29  RAST neg  - Mar 13, 2018  EGD > pos medium sized HH  - 03/01/2024 repeated rec For flare of coughing for any reason > Try prilosec otc 20mg   Take 30-60 min before first meal of the day and Pepcid  ac  (famotidine ) 20 mg one @  bedtime until better for at least a week           Each maintenance medication was reviewed in detail including emphasizing most importantly the difference between maintenance and prns and under what circumstances the prns are to be triggered using an action plan format where appropriate.  Total time for H and P, chart review, counseling, reviewing hfa device(s) and generating customized AVS unique to this office visit / same day charting = 32 min          AVS  Patient Instructions  Plan A = Automatic = Always=    Symbicort  160 Take 2 puffs first thing in am and then another 2 puffs about 12 hours later.   Plan B = Backup (to supplement plan A, not to replace it) Use your albuterol -budesonide  inhaler as a rescue medication to be used if you can't catch your breath by resting or slowing your pace  or doing a relaxed purse lip breathing pattern.  - Ok to use the inhaler up to 2 puffs  every 4 hours if you must but call for appointment if use goes up over your usual need for more than 2 weeks  - Don't leave home without it !!  (think of it like the spare tire or starter fluid for your car)   For cough/ congestion >  mucinex dm  up to maximum of  1200 mg every 12 hours and use the flutter valve as much as you can  and add Try prilosec otc 20mg   Take 30-60 min before first meal of the day and Pepcid  ac (famotidine ) 20 mg one after supper  until cough is completely gone for at least a week without the need for cough suppression     Please schedule a follow up visit in 6  months but call sooner if needed    Ozell America, MD 03/02/2024    "

## 2024-03-01 NOTE — Patient Instructions (Addendum)
 Plan A = Automatic = Always=    Symbicort  160 Take 2 puffs first thing in am and then another 2 puffs about 12 hours later.   Plan B = Backup (to supplement plan A, not to replace it) Use your albuterol -budesonide  inhaler as a rescue medication to be used if you can't catch your breath by resting or slowing your pace  or doing a relaxed purse lip breathing pattern.  - Ok to use the inhaler up to 2 puffs  every 4 hours if you must but call for appointment if use goes up over your usual need for more than 2 weeks  - Don't leave home without it !!  (think of it like the spare tire or starter fluid for your car)   For cough/ congestion >  mucinex dm  up to maximum of  1200 mg every 12 hours and use the flutter valve as much as you can  and add Try prilosec otc 20mg   Take 30-60 min before first meal of the day and Pepcid  ac (famotidine ) 20 mg one after supper  until cough is completely gone for at least a week without the need for cough suppression     Please schedule a follow up visit in 6  months but call sooner if needed

## 2024-03-02 NOTE — Assessment & Plan Note (Addendum)
-   Max acid suppression started 06/27/13 > modified to h2 bid 10/09/2013  - sinus CT neg 07/24/13  - trial of chlortrimeton started 09/11/2013 >> - Allergy  profile 11/22/17 >  Eos 0.4 /  IgE  29  RAST neg  - Mar 13, 2018  EGD > pos medium sized HH  - 03/01/2024 repeated rec For flare of coughing for any reason > Try prilosec otc 20mg   Take 30-60 min before first meal of the day and Pepcid  ac (famotidine ) 20 mg one @  bedtime until better for at least a week           Each maintenance medication was reviewed in detail including emphasizing most importantly the difference between maintenance and prns and under what circumstances the prns are to be triggered using an action plan format where appropriate.  Total time for H and P, chart review, counseling, reviewing hfa device(s) and generating customized AVS unique to this office visit / same day charting = 32 min

## 2024-03-02 NOTE — Assessment & Plan Note (Addendum)
 Onset ? 2014 assoc with rhinitis  - started on dulera 100 2bid 06/27/13> better 07/11/2013  - 07/24/13 Sinus CT > Slight mucosal thickening in the right maxillary sinus. Remainder of the paranasal sinuses are clear. No air-fluid levels. No acute bony abnormality. Orbital soft tissues are unremarkable. - spirometry  08/22/2013 min airflow obst reflected in fef 25-75 despite prominent wheeze on exam - 09/11/13 increased dulera to 200 2bid due to concern not using the dulera frequently enough -  12/02/2016  extensive coaching HFA effectiveness =75% from  A baseline of 50%     -  Spirometry 12/02/2016  FEV1 2.50 (93%)  Ratio 74 off all rx, minimal curvature   - 11/22/2017   continue symb 80 - Allergy  profile 11/22/17 >  Eos 0.4 /  IgE  29  RAST neg  - FENO 04/11/2018  =   69 p symb 80 2bid prior to study during flare of asthma  - 04/11/2018    increased symb to 160 2bid and added singulair  > much better 04/27/2018  - 04/27/2018   continue symb 160 2bid/ singulair   - 03/01/2024  After extensive coaching inhaler device,  effectiveness =    90% hfa   Since prev ov with me had air supra prn added to maint rx with montelukast  but only uses once a day.  Explained need for supplmental bronchodilaterol/budesonide  for flares that will will continue to occur and may require prednisone  orally if not more aggressive with ICS and that's the purpose of Airsupra   >>>> Air Supra Rx is up to 12 puffs per day until symptoms are controlled then taper off completely.

## 2024-03-12 ENCOUNTER — Telehealth: Payer: Self-pay | Admitting: Neurology

## 2024-03-12 ENCOUNTER — Encounter: Payer: Self-pay | Admitting: Neurology

## 2024-03-12 ENCOUNTER — Ambulatory Visit: Admitting: Neurology

## 2024-03-12 VITALS — BP 103/71 | HR 84 | Ht 65.0 in | Wt 190.0 lb

## 2024-03-12 DIAGNOSIS — M542 Cervicalgia: Secondary | ICD-10-CM

## 2024-03-12 DIAGNOSIS — R202 Paresthesia of skin: Secondary | ICD-10-CM

## 2024-03-12 NOTE — Progress Notes (Unsigned)
 "  Chief Complaint  Patient presents with   New Patient (Initial Visit)    Pt in room 15.  Alone. Internal referral for Numbness and tingling in left arm, Cervical neck pain with evidence of disc disease.      ASSESSMENT AND PLAN  Marie Harper is a 49 y.o. female   Paresthesia of upper extremities left worse than right Left neck pain  Differentiation diagnoses including left cervical radiculopathy, upper extremity focal neuropathy, versus musculoskeletal in etiology, she does have significant left shoulder tenderness upon deep palpitation  Suggest warm compression, as needed NSAIDs, tizanidine  as needed  Refer to physical therapy  EMG nerve conduction study,  Will only proceed to MRI of cervical if needed  DIAGNOSTIC DATA (LABS, IMAGING, TESTING) - I reviewed patient records, labs, notes, testing and imaging myself where available.   MEDICAL HISTORY:  Marie Harper, seen in request by   Georgina Speaks, FNP    History is obtained from the patient and review of electronic medical records. I personally reviewed pertinent available imaging films in PACS.   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  PHYSICAL EXAMNIATION:  Gen: NAD, conversant, well  nourised, well groomed                     Cardiovascular: Regular rate rhythm, no peripheral edema, warm, nontender. Eyes: Conjunctivae clear without exudates or hemorrhage Neck: Supple, no carotid bruits. Pulmonary: Clear to auscultation bilaterally   NEUROLOGICAL EXAM:  MENTAL STATUS: Speech/cognition: Awake, alert, oriented to history taking and casual conversation CRANIAL NERVES: CN II: Visual fields are full to confrontation. Pupils are round equal and briskly reactive to light. CN III, IV, VI: extraocular movement are normal. No ptosis. CN V: Facial sensation is intact to light touch CN VII: Face is symmetric with normal eye closure  CN VIII: Hearing is normal to causal conversation. CN IX, X: Phonation is normal. CN XI: Head turning and shoulder shrug are intact  MOTOR: Tenderness of left shoulder upon deep palpitation There is no pronator drift of out-stretched arms. Muscle bulk and tone are normal. Muscle strength is normal.  REFLEXES: Reflexes are 2+ and symmetric at the biceps, triceps, knees, and ankles. Plantar responses are flexor.  SENSORY: Intact to light touch, pinprick and vibratory sensation are intact in fingers and toes.  COORDINATION: There is no trunk or limb dysmetria noted.  GAIT/STANCE: Posture is normal. Gait is steady with normal steps, base, arm swing, and turning. Heel and toe walking are normal. Tandem gait is normal.  Romberg is absent.  REVIEW OF SYSTEMS:  Full 14 system review of systems performed and notable  only for as above All other review of systems were negative.   ALLERGIES: Allergies[1]  HOME MEDICATIONS: Current Outpatient Medications  Medication Sig Dispense Refill   albuterol  (VENTOLIN  HFA) 108 (90 Base) MCG/ACT inhaler INHALE 1-2 PUFFS INTO THE LUNGS EVERY 4 HOURS AS NEEDED FOR WHEEZING OR SHORTNESS OF BREATH. 8.5 each 1   Albuterol -Budesonide  (AIRSUPRA ) 90-80 MCG/ACT AERO Inhale 2 puffs into the lungs every 4 (four) hours  as needed. 10.7 g 11   ALTRENO  0.05 % LOTN Apply 1 Application topically at bedtime. For the first month apply 2 nights weekly, after 1 month apply 3 nights weekly 20 g 5   budesonide -formoterol  (SYMBICORT ) 160-4.5 MCG/ACT inhaler Inhale 2 puffs into the lungs 2 (two) times daily.     buPROPion  (WELLBUTRIN  XL) 300 MG 24 hr tablet Take 300 mg by mouth daily.     clindamycin  (CLEOCIN  T) 1 % SWAB Apply 1 Application topically daily. Swab face in the morning after washing 69 each 6   Doxepin HCl 6 MG TABS TAKE 1/2 TAB ORAL AT BEDTIME FOR INSOMNIA 45 tablet 1   fexofenadine (ALLEGRA) 180 MG tablet Take 1 tablet by mouth daily.     levonorgestrel (MIRENA, 52 MG,) 20 MCG/24HR IUD Mirena 20 mcg/24 hours (7 yrs) 52 mg intrauterine device  Take 1 device by intrauterine route.     meloxicam (MOBIC) 15 MG tablet TAKE 1 TABLET BY MOUTH EVERY DAY 30 tablet 1   metFORMIN  (GLUCOPHAGE -XR) 500 MG 24 hr tablet Take 1 tablet (500 mg total) by mouth 2 (two) times daily with a meal. 60 tablet 1   mometasone  (ELOCON ) 0.1 % ointment Apply topically daily. Apply 2 times daily for 2 weeks then STOP and take a break for 2 WEEKS 45 g 11   montelukast  (SINGULAIR ) 10 MG tablet Take 1 tablet (10 mg total) by mouth at bedtime. 90 tablet 1   Multiple Vitamin (MULTI VITAMIN PO) Multi Vitamin     sertraline  (ZOLOFT ) 25 MG tablet Take 1 tablet (25 mg total) by mouth daily. 90 tablet 1   SUMAtriptan  (IMITREX ) 50 MG tablet TAKE 1 TABLET BY MOUTH ONCE AS NEEDED FOR MIGRAINE. MAY REPEAT IN 2 HOURS IF HEADACHE PERSISTS OR RECURS. 27 tablet 3   tiZANidine  (ZANAFLEX ) 4 MG tablet Take 1 tablet (4 mg total) by mouth 2 (two) times daily as needed for muscle spasms. 30 tablet 0   traMADol  (ULTRAM ) 50 MG tablet Take 1 tablet (50 mg total) by mouth every 6 (six) hours as needed. 20 tablet 0   Vitamin D , Ergocalciferol , (DRISDOL ) 1.25 MG (50000 UNIT) CAPS capsule Take 1 capsule (50,000 Units total) by mouth every 7 (seven) days. 5 capsule 1   No  current facility-administered medications for this visit.    PAST MEDICAL HISTORY: Past Medical History:  Diagnosis Date   Allergy     seasonal   Anemia    Anxiety    Arthritis    Asthma    Bilateral swelling of feet    Chronic kidney disease    Depression 2020   GERD (gastroesophageal reflux disease)    Headache(784.0)    has not had migraine in long time   Heartburn    Hemorrhage of anus and rectum 04/28/2022   OSA (obstructive sleep apnea)    Pre-diabetes    Rectal bleeding 04/28/2022   Ulcer    Vitamin B 12 deficiency    Vitamin B12 deficiency    Vitamin D  deficiency     PAST SURGICAL HISTORY:  Past Surgical History:  Procedure Laterality Date   BUNIONECTOMY     FOOT SURGERY     left   KNEE CARTILAGE SURGERY Right 2015    FAMILY HISTORY: Family History  Problem Relation Age of Onset   Obesity Mother    Diabetes Mother    Diabetes Father    Cancer Maternal Aunt        nasal/sinus   Asthma Paternal Aunt    Anxiety disorder Paternal Aunt    Emphysema Paternal Uncle        smoked   Diabetes Maternal Grandmother    Heart disease Maternal Grandmother    Arthritis Maternal Grandmother    Diabetes Paternal Grandmother    Arthritis Other    Kidney disease Other    Diabetes Other    Anesthesia problems Neg Hx     SOCIAL HISTORY: Social History   Socioeconomic History   Marital status: Single    Spouse name: Not on file   Number of children: 0   Years of education: Not on file   Highest education level: Associate degree: academic program  Occupational History   Occupation: location manager.     Employer: [employer]  Tobacco Use   Smoking status: Never   Smokeless tobacco: Never  Vaping Use   Vaping status: Never Used  Substance and Sexual Activity   Alcohol use: Yes    Alcohol/week: 2.0 standard drinks of alcohol    Comment: socially   Drug use: No   Sexual activity: Yes    Partners: Male    Birth control/protection: Condom, I.U.D.,  None  Other Topics Concern   Not on file  Social History Narrative   Lives alone.   Caffienated drinks-yes   Seat belt use often-yes   Regular Exercise-no   Smoke alarm in the home-yes   Firearms/guns in the home-yes   History of physical abuse-no                  Social Drivers of Health   Tobacco Use: Low Risk (03/12/2024)   Patient History    Smoking Tobacco Use: Never    Smokeless Tobacco Use: Never    Passive Exposure: Not on file  Financial Resource Strain: Low Risk (08/06/2023)   Overall Financial Resource Strain (CARDIA)    Difficulty of Paying Living Expenses: Not very hard  Food Insecurity: No Food Insecurity (08/06/2023)   Epic    Worried About Programme Researcher, Broadcasting/film/video in the Last Year: Never true    Ran Out of Food in the Last Year: Never true  Transportation Needs: No Transportation Needs (08/06/2023)   Epic    Lack of Transportation (Medical): No    Lack of Transportation (Non-Medical): No  Physical Activity: Insufficiently Active (08/06/2023)   Exercise Vital Sign    Days of Exercise per Week: 2 days    Minutes of Exercise per Session: 20 min  Stress: No Stress Concern Present (08/06/2023)   [employer] of Occupational Health - Occupational Stress Questionnaire    Feeling of Stress: Only a little  Social Connections: Moderately Integrated (08/06/2023)   Social Connection and Isolation Panel    Frequency of Communication with Friends and Family: Three times a week    Frequency of Social Gatherings with Friends and Family: Patient declined    Attends Religious Services: More than 4 times per year    Active Member of [employer] or Organizations: Yes    Attends Banker Meetings: More than 4  times per year    Marital Status: Never married  Intimate Partner Violence: Not on file  Depression (PHQ2-9): Medium Risk (10/20/2023)   Depression (PHQ2-9)    PHQ-2 Score: 7  Alcohol Screen: Low Risk (08/06/2023)   Alcohol Screen    Last Alcohol Screening Score  (AUDIT): 1  Housing: Unknown (08/06/2023)   Epic    Unable to Pay for Housing in the Last Year: Patient declined    Number of Times Moved in the Last Year: 0    Homeless in the Last Year: No  Utilities: Not on file  Health Literacy: Not on file      Modena Callander, M.D. Ph.D.  [facility] [address] [place], [place] [address] Ph: [phone] Fax: [phone]  CC:  Georgina Speaks, FNP [address] [place],  [place] [address]  Georgina Speaks, FNP      [1] No Known Allergies  "

## 2024-03-12 NOTE — Telephone Encounter (Signed)
 Referral for physical therapy sent through EPIC to [facility]. Phone: [phone]

## 2024-03-21 ENCOUNTER — Encounter (INDEPENDENT_AMBULATORY_CARE_PROVIDER_SITE_OTHER): Payer: Self-pay | Admitting: Family Medicine

## 2024-03-21 ENCOUNTER — Ambulatory Visit (INDEPENDENT_AMBULATORY_CARE_PROVIDER_SITE_OTHER): Admitting: Family Medicine

## 2024-03-21 VITALS — BP 121/84 | HR 81 | Temp 98.5°F | Ht 65.0 in | Wt 188.0 lb

## 2024-03-21 DIAGNOSIS — E559 Vitamin D deficiency, unspecified: Secondary | ICD-10-CM

## 2024-03-21 DIAGNOSIS — E781 Pure hyperglyceridemia: Secondary | ICD-10-CM

## 2024-03-21 DIAGNOSIS — R7303 Prediabetes: Secondary | ICD-10-CM | POA: Diagnosis not present

## 2024-03-21 DIAGNOSIS — E66811 Obesity, class 1: Secondary | ICD-10-CM | POA: Diagnosis not present

## 2024-03-21 DIAGNOSIS — Z6831 Body mass index (BMI) 31.0-31.9, adult: Secondary | ICD-10-CM

## 2024-03-21 MED ORDER — METFORMIN HCL ER 500 MG PO TB24: 500.0000 mg | ORAL_TABLET | Freq: Two times a day (BID) | ORAL | 1 refills | Status: AC

## 2024-03-21 MED ORDER — VITAMIN D (ERGOCALCIFEROL) 1.25 MG (50000 UNIT) PO CAPS: 50000.0000 [IU] | ORAL_CAPSULE | ORAL | 1 refills | Status: AC

## 2024-03-21 NOTE — Progress Notes (Signed)
 "  Marie Harper, D.O.  ABFM, ABOM Specializing in Clinical Bariatric Medicine  Office located at: [address] [place], [place]  [address]        Orders Placed This Encounter  Procedures   VITAMIN D  25 Hydroxy (Vit-D Deficiency, Fractures)   Hemoglobin A1c   Insulin , random   Lipid panel   Comprehensive metabolic panel with GFR    Medications Discontinued During This Encounter  Medication Reason   Vitamin D , Ergocalciferol , (DRISDOL ) 1.25 MG (50000 UNIT) CAPS capsule Reorder   metFORMIN  (GLUCOPHAGE -XR) 500 MG 24 hr tablet Reorder     Meds ordered this encounter  Medications   metFORMIN  (GLUCOPHAGE -XR) 500 MG 24 hr tablet    Sig: Take 1 tablet (500 mg total) by mouth 2 (two) times daily with a meal.    Dispense:  60 tablet    Refill:  1   Vitamin D , Ergocalciferol , (DRISDOL ) 1.25 MG (50000 UNIT) CAPS capsule    Sig: Take 1 capsule (50,000 Units total) by mouth every 7 (seven) days.    Dispense:  5 capsule    Refill:  1      FOR THE CHRONIC DISEASE OF OBESITY: Class 1 obesity (BMI) of 30.0 to 30.9 in adult, START BMI 29.79 Class 1 obesity (BMI) of 31.0 to 31.9 in adult, CURRENT BMI 31.28  Chief complaint: Obesity Marie Harper is here to discuss her progress with her obesity treatment plan.   History of present illness / Interval history:  Marie Harper is here today for her follow-up office visit.  Since last OV on 01/25/2024 with provider: Wilkinson, Dana E, NP , patient is down 1 lb.  Recent challenges/ barriers to successful adherence of program recommendations:  personal stressors and had URI infection recently, where she was on steroids and antibiotics--> had recent lapse due to illness  Pt has been struggling with:  []  meal planning and prepping []  exercise []  sleep []  stressors []  mood [x]  chronic or acute medical conditions- URI infection recently [x]  struggling to adhere to protein goals []  eating out more []  Nothing, doing great  Pt has  been working on and improving their:  []  meal planning and prepping []  exercise []  sleep hygiene []  water intake []  strategies to better manage personal stressors []  strategies to better manage mood []  eating out less [x]  Nothing particular at this time  When asked by CMA prior to our office visit today, pt states they have been:  - Focused on eating fresh, unprocessed foods?  Yes   - Focused on eating lean proteins with each meal?  No   - Sleeping 7-9 Hours/ Night?  Yes   - Skipping Meals?  Yes - States she is following her healthy eating plan approximately 40% of the time.   Recent weight loss data history  01/25/24 15:00 03/21/24 15:00   Body Fat % 38.7 % 39.4 %  Muscle Mass (lbs) 110 lbs 108.2 lbs  Fat Mass (lbs) 73.2 lbs 74 lbs  Total Body Water (lbs) 78 lbs 77.6 lbs  Visceral Fat Rating  9 9     Physician directed Nutrition Therapy prescription: She is on the Category 2 Plan.    Marie Harper is currently in the action stage of change. As such, her goal is to continue weight management plan.  Detailed, physician-directed nutritional counseling was provided, including: [] Discussed meal prep companies. Ie. Longlife, Factor Meals, Purple carrot, Hungryroot etc [] Discussed strategies for meal planning and prepping at home [x] Discussed supermarket healthy  prepared meal options- ie. Kevin's natural meals or Factor Prepared meals    She has agreed to: continue current reduced-calorie meal plan    Physician directed Behavioral Modification prescription: Evidence-based interventions for healthy behavior change were utilized today including the discussion of small changes and SMART goals.  We discussed the following today: increasing lean protein intake to established goals, avoiding skipping meals, continue to work on implementation of reduced calorie nutritional plan, and continue to practice mindfulness when eating  Additional resources provided: Handout on healthy  eating and balanced plate, Category 2 packet, Handout on complex carbohydrates and lean sources of protein, and Hand on Characteristics of successful weight losers and weight maintainers.   Physician directed Physical Activity prescription: Pt is walking 30-40  minutes 3 days per week  Exercise prescription: She has agreed to Think about enjoyable ways to increase daily physical activity and overcoming barriers to exercise, Continue current level of physical activity , and Continue to gradually increase the amount and intensity of exercise routine    Medical Interventions/ Pharmacotherapy Previous Bariatric surgery: none   Pharmacotherapy for weight loss: She is currently taking Wellbutrin   XL and Metformin -XR  for medical weight loss.     We discussed various medication options to help Marie Harper with her weight loss efforts and we both agreed to: Shared decision: Continue with current nutritional and behavioral strategies  SPECIFIC behavorial / nutritional / exercise goals for next office visit:  Increase protein intake.  B) OBESITY RELATED CONDITIONS ADDRESSED TODAY:   OBTAIN LABS TODAY.  Prediabetes A1c 6.0 02/2022 Assessment & Plan Lab Results  Component Value Date   HGBA1C 5.4 09/26/2023   HGBA1C 5.5 09/23/2022   HGBA1C 6.0 (H) 02/25/2022   INSULIN  11.5 10/20/2023   INSULIN  31.5 (H) 02/25/2022  Currently prescribed Metformin -XR twice daily. Pt reports, recently poor compliance with second dose of medication. . Because pt forget to take second dose, encouraged her to take both doses at once to maximize therapeutic benefit. Hunger and cravings are overall well controlled. Pt reports slight GI upset, mainly when she eats off-plan. Encouraged pt to focus on whole foods and lean proteins. Discussed different sources for lean proteins, with a goal of 20-30 grams of protein per meal. Cont medication(refill today). Cont adherence to PNP and increase exercise as able to improve glycemic  control, promote weight loss, and prevent progression to T2DM.    Hypertriglyceridemia Assessment & Plan Lab Results  Component Value Date   CHOL 148 09/26/2023   HDL 42 09/26/2023   LDLCALC 73 09/26/2023   TRIG 198 (H) 09/26/2023   CHOLHDL 3.5 09/26/2023  Managed by dietary and lifestyle interventions. Pt reports no concerns today. Cont to decrease saturated/trans fats and fatty carbs. Recheck FLP today.   Vitamin D  deficiency Assessment & Plan Lab Results  Component Value Date   VD25OH 35.1 10/20/2023  Currently on Ergo once weekly with good compliance and tolerance. Pt reports no concerns. Cont regimen (refill today). Recheck levels today.     Follow up:   Return in about 6 weeks (around 05/02/2024).   She was informed of the importance of frequent follow up visits to maximize her success with intensive lifestyle modifications for her multiple health conditions.  Marie Harper is aware that we will review all of her lab results at our next visit together in person.  She is aware that if anything is critical/ life threatening with the results, we will be contacting her via MyChart or by my CMA will  be calling them prior to the office visit to discuss acute management.      Weight Summary and Biometrics   Weight Lost Since Last Visit: 1lb  Weight Gained Since Last Visit: 0lb    Vitals Temp: 98.5 F (36.9 C) BP: 121/84 Pulse Rate: 81 SpO2: 100 %   Anthropometric Measurements Height: 5' 5 (1.651 m) Weight: 188 lb (85.3 kg) BMI (Calculated): 31.28 Weight at Last Visit: 189lb Weight Lost Since Last Visit: 1lb Weight Gained Since Last Visit: 0lb Starting Weight: 179lb Total Weight Loss (lbs): 0 lb (0 kg) Peak Weight: 189   Body Composition  Body Fat %: 39.4 % Fat Mass (lbs): 74 lbs Muscle Mass (lbs): 108.2 lbs Total Body Water (lbs): 77.6 lbs Visceral Fat Rating : 9   Other Clinical Data Fasting: no Labs: no Today's Visit #: 6 Starting Date:  10/20/23    Objective:   PHYSICAL EXAM: Blood pressure 121/84, pulse 81, temperature 98.5 F (36.9 C), height 5' 5 (1.651 m), weight 188 lb (85.3 kg), last menstrual period 03/05/2024, SpO2 100%. Body mass index is 31.28 kg/m. General: she is overweight, cooperative and in no acute distress. PSYCH: Has normal mood, affect and thought process.   HEENT: EOMI, sclerae are anicteric. Lungs: Normal breathing effort, no conversational dyspnea. Extremities: Moves * 4 Neurologic: A and O * 3, good insight  DIAGNOSTIC DATA REVIEWED: BMET    Component Value Date/Time   NA 139 10/20/2023 0934   K 4.2 10/20/2023 0934   CL 103 10/20/2023 0934   CO2 19 (L) 10/20/2023 0934   GLUCOSE 79 10/20/2023 0934   GLUCOSE 125 (H) 09/08/2016 0333   BUN 12 10/20/2023 0934   CREATININE 0.76 10/20/2023 0934   CALCIUM 9.2 10/20/2023 0934   GFRNONAA >60 09/08/2016 0333   GFRAA >60 09/08/2016 0333   Lab Results  Component Value Date   HGBA1C 5.4 09/26/2023   HGBA1C 5.5 02/18/2011   Lab Results  Component Value Date   INSULIN  11.5 10/20/2023   INSULIN  31.5 (H) 02/25/2022   Lab Results  Component Value Date   TSH 1.280 10/20/2023   CBC    Component Value Date/Time   WBC 6.9 09/26/2023 1644   WBC 11.7 (H) 11/22/2017 1104   RBC 4.33 09/26/2023 1644   RBC 4.57 11/22/2017 1104   HGB 12.1 09/26/2023 1644   HCT 37.7 09/26/2023 1644   PLT 232 09/26/2023 1644   MCV 87 09/26/2023 1644   MCH 27.9 09/26/2023 1644   MCH 27.3 09/08/2016 0333   MCHC 32.1 09/26/2023 1644   MCHC 33.5 11/22/2017 1104   RDW 14.3 09/26/2023 1644   Iron Studies    Component Value Date/Time   IRON 63 02/18/2011 1149   FERRITIN 77.0 02/18/2011 1149   IRONPCTSAT 20.4 02/18/2011 1149   Lipid Panel     Component Value Date/Time   CHOL 148 09/26/2023 1644   TRIG 198 (H) 09/26/2023 1644   HDL 42 09/26/2023 1644   CHOLHDL 3.5 09/26/2023 1644   LDLCALC 73 09/26/2023 1644   Hepatic Function Panel     Component  Value Date/Time   PROT 7.2 09/26/2023 1644   ALBUMIN 4.5 09/26/2023 1644   AST 24 09/26/2023 1644   ALT 23 09/26/2023 1644   ALKPHOS 39 (L) 09/26/2023 1644   BILITOT 0.3 09/26/2023 1644      Component Value Date/Time   TSH 1.280 10/20/2023 0934   Nutritional Lab Results  Component Value Date   VD25OH 35.1 10/20/2023  Attestations:   I, ***, acting as a medical scribe for Marie Jenkins, DO., have compiled all relevant documentation for today's office visit on behalf of Marie Jenkins, DO, while in the presence of Marie & Mclennan, DO.  Only keep if time based coding I have spent *** minutes in the care of the patient including:   -   *** minutes before the visit reviewing and preparing the chart.   -   *** minutes face-to-face assessing and reviewing listed medical problems as outlined in obesity care plan, providing nutritional and behavioral counseling on topics outlined in the obesity care plan, independently interpreting test results and goals of care as described in assessment and plan, and reviewing and discussing biometric information and progress with obesity treatment plan  -   *** minutes after the visit regarding the EMR documentation of encounter.   I have reviewed the above documentation for accuracy and completeness, and I agree with the above. Marie JINNY Harper, D.O.  The 21st Century Cures Act was signed into law in 2016 which includes the topic of electronic health records.  This provides immediate access to information in MyChart.  This includes consultation notes, operative notes, office notes, lab results and pathology reports.  If you have any questions about what you read please let us  know at your next visit so we can discuss your concerns and take corrective action if need be.  We are right here with you.  "

## 2024-03-26 NOTE — Progress Notes (Unsigned)
 LILLETTE Kristeen JINNY Gladis, CMA,acting as a neurosurgeon for [name], FNP.,have documented all relevant documentation on the behalf of [name], FNP,as directed by  [name], FNP while in the presence of [name], FNP.  Subjective:  Patient ID: Marie Harper , female    DOB: [birth year] , 49 y.o.   MRN: [id number]  No chief complaint on file.   HPI  HPI   Past Medical History:  Diagnosis Date   Allergy     seasonal   Anemia    Anxiety    Arthritis    Asthma    Bilateral swelling of feet    Chronic kidney disease    Depression 2020   GERD (gastroesophageal reflux disease)    Headache(784.0)    has not had migraine in long time   Heartburn    Hemorrhage of anus and rectum 04/28/2022   OSA (obstructive sleep apnea)    Pre-diabetes    Rectal bleeding 04/28/2022   Ulcer    Vitamin B 12 deficiency    Vitamin B12 deficiency    Vitamin D  deficiency      Family History  Problem Relation Age of Onset   Obesity Mother    Diabetes Mother    Diabetes Father    Cancer Maternal Aunt        nasal/sinus   Asthma Paternal Aunt    Anxiety disorder Paternal Aunt    Emphysema Paternal Uncle        smoked   Diabetes Maternal Grandmother    Heart disease Maternal Grandmother    Arthritis Maternal Grandmother    Diabetes Paternal Grandmother    Arthritis Other    Kidney disease Other    Diabetes Other    Anesthesia problems Neg Hx     Current Medications[1]   Allergies[2]   Review of Systems   There were no vitals filed for this visit. There is no height or weight on file to calculate BMI.  Wt Readings from Last 3 Encounters:  03/21/24 188 lb (85.3 kg)  03/12/24 190 lb (86.2 kg)  03/01/24 188 lb 9.6 oz (85.5 kg)    The 10-year ASCVD risk score (Arnett DK, et al., 2019) is: 1.3%   Values used to calculate the score:     Age: 50 years     Clinically relevant sex: Female     Is Non-Hispanic African American: Yes     Diabetic: No     Tobacco smoker: No      Systolic Blood Pressure: 121 mmHg     Is BP treated: No     HDL Cholesterol: 42 mg/dL     Total Cholesterol: 148 mg/dL  Objective:  Physical Exam      Assessment And Plan:   Assessment & Plan Prediabetes   No orders of the defined types were placed in this encounter.    No follow-ups on file.  Patient was given opportunity to ask questions. Patient verbalized understanding of the plan and was able to repeat key elements of the plan. All questions were answered to their satisfaction.    LILLETTE [name], FNP, have reviewed all documentation for this visit. The documentation on 03/26/24 for the exam, diagnosis, procedures, and orders are all accurate and complete.   IF YOU HAVE BEEN REFERRED TO A SPECIALIST, IT MAY TAKE 1-2 WEEKS TO SCHEDULE/PROCESS THE REFERRAL. IF YOU HAVE NOT HEARD FROM US /SPECIALIST IN TWO WEEKS, PLEASE GIVE US  A CALL AT [phone].     [1]  Current Outpatient Medications:    albuterol  (VENTOLIN  HFA) 108 (90 Base) MCG/ACT inhaler, INHALE 1-2 PUFFS INTO THE LUNGS EVERY 4 HOURS AS NEEDED FOR WHEEZING OR SHORTNESS OF BREATH., Disp: 8.5 each, Rfl: 1   Albuterol -Budesonide  (AIRSUPRA ) 90-80 MCG/ACT AERO, Inhale 2 puffs into the lungs every 4 (four) hours as needed., Disp: 10.7 g, Rfl: 11   ALTRENO  0.05 % LOTN, Apply 1 Application topically at bedtime. For the first month apply 2 nights weekly, after 1 month apply 3 nights weekly, Disp: 20 g, Rfl: 5   budesonide -formoterol  (SYMBICORT ) 160-4.5 MCG/ACT inhaler, Inhale 2 puffs into the lungs 2 (two) times daily., Disp: , Rfl:    buPROPion  (WELLBUTRIN  XL) 300 MG 24 hr tablet, Take 300 mg by mouth daily., Disp: , Rfl:    clindamycin  (CLEOCIN  T) 1 % SWAB, Apply 1 Application topically daily. Swab face in the morning after washing, Disp: 69 each, Rfl: 6   Doxepin  HCl 6 MG TABS, TAKE 1/2 TAB ORAL AT BEDTIME FOR INSOMNIA, Disp: 45 tablet, Rfl: 1   fexofenadine (ALLEGRA) 180 MG tablet, Take 1 tablet by mouth daily.,  Disp: , Rfl:    levonorgestrel (MIRENA, 52 MG,) 20 MCG/24HR IUD, Mirena 20 mcg/24 hours (7 yrs) 52 mg intrauterine device  Take 1 device by intrauterine route., Disp: , Rfl:    meloxicam (MOBIC) 15 MG tablet, TAKE 1 TABLET BY MOUTH EVERY DAY, Disp: 30 tablet, Rfl: 1   metFORMIN  (GLUCOPHAGE -XR) 500 MG 24 hr tablet, Take 1 tablet (500 mg total) by mouth 2 (two) times daily with a meal., Disp: 60 tablet, Rfl: 1   mometasone  (ELOCON ) 0.1 % ointment, Apply topically daily. Apply 2 times daily for 2 weeks then STOP and take a break for 2 WEEKS, Disp: 45 g, Rfl: 11   montelukast  (SINGULAIR ) 10 MG tablet, Take 1 tablet (10 mg total) by mouth at bedtime., Disp: 90 tablet, Rfl: 1   Multiple Vitamin (MULTI VITAMIN PO), Multi Vitamin, Disp: , Rfl:    sertraline  (ZOLOFT ) 25 MG tablet, Take 1 tablet (25 mg total) by mouth daily., Disp: 90 tablet, Rfl: 1   SUMAtriptan  (IMITREX ) 50 MG tablet, TAKE 1 TABLET BY MOUTH ONCE AS NEEDED FOR MIGRAINE. MAY REPEAT IN 2 HOURS IF HEADACHE PERSISTS OR RECURS., Disp: 27 tablet, Rfl: 3   tiZANidine  (ZANAFLEX ) 4 MG tablet, Take 1 tablet (4 mg total) by mouth 2 (two) times daily as needed for muscle spasms., Disp: 30 tablet, Rfl: 0   traMADol  (ULTRAM ) 50 MG tablet, Take 1 tablet (50 mg total) by mouth every 6 (six) hours as needed., Disp: 20 tablet, Rfl: 0   Vitamin D , Ergocalciferol , (DRISDOL ) 1.25 MG (50000 UNIT) CAPS capsule, Take 1 capsule (50,000 Units total) by mouth every 7 (seven) days., Disp: 5 capsule, Rfl: 1 [2] No Known Allergies

## 2024-03-29 ENCOUNTER — Ambulatory Visit: Payer: Self-pay | Admitting: Nurse Practitioner

## 2024-03-29 ENCOUNTER — Encounter: Payer: Self-pay | Admitting: Nurse Practitioner

## 2024-03-29 ENCOUNTER — Other Ambulatory Visit: Payer: Self-pay | Admitting: Nurse Practitioner

## 2024-03-29 VITALS — BP 120/70 | HR 95 | Temp 99.1°F | Ht 65.0 in | Wt 191.6 lb

## 2024-03-29 DIAGNOSIS — F5101 Primary insomnia: Secondary | ICD-10-CM

## 2024-03-29 DIAGNOSIS — R7303 Prediabetes: Secondary | ICD-10-CM

## 2024-03-29 DIAGNOSIS — F411 Generalized anxiety disorder: Secondary | ICD-10-CM

## 2024-03-29 DIAGNOSIS — E6609 Other obesity due to excess calories: Secondary | ICD-10-CM

## 2024-03-29 DIAGNOSIS — Z8669 Personal history of other diseases of the nervous system and sense organs: Secondary | ICD-10-CM

## 2024-03-29 DIAGNOSIS — E559 Vitamin D deficiency, unspecified: Secondary | ICD-10-CM

## 2024-03-29 DIAGNOSIS — E781 Pure hyperglyceridemia: Secondary | ICD-10-CM

## 2024-03-29 MED ORDER — MONTELUKAST SODIUM 10 MG PO TABS: 10.0000 mg | ORAL_TABLET | Freq: Every day | ORAL | 1 refills | Status: AC

## 2024-03-29 MED ORDER — DOXEPIN HCL 6 MG PO TABS: ORAL_TABLET | ORAL | 0 refills | Status: AC

## 2024-04-09 ENCOUNTER — Ambulatory Visit: Admitting: Physical Therapy

## 2024-05-02 ENCOUNTER — Ambulatory Visit (INDEPENDENT_AMBULATORY_CARE_PROVIDER_SITE_OTHER): Admitting: Family Medicine

## 2024-05-09 ENCOUNTER — Encounter: Admitting: Neurology

## 2024-05-29 ENCOUNTER — Ambulatory Visit: Admitting: Dermatology

## 2024-09-26 ENCOUNTER — Ambulatory Visit: Payer: Self-pay | Admitting: Nurse Practitioner

## 2024-09-26 ENCOUNTER — Encounter: Admitting: Nurse Practitioner
